# Patient Record
Sex: Female | Born: 1935 | Race: White | Hispanic: No | Smoking: Never smoker
Health system: Southern US, Community
[De-identification: ages and names within clinical notes are randomized; demographics above are authoritative.]

## PROBLEM LIST (undated history)

## (undated) DIAGNOSIS — K219 Gastro-esophageal reflux disease without esophagitis: Secondary | ICD-10-CM

## (undated) DIAGNOSIS — F329 Major depressive disorder, single episode, unspecified: Secondary | ICD-10-CM

## (undated) DIAGNOSIS — R112 Nausea with vomiting, unspecified: Secondary | ICD-10-CM

## (undated) DIAGNOSIS — F32A Depression, unspecified: Secondary | ICD-10-CM

## (undated) DIAGNOSIS — R945 Abnormal results of liver function studies: Secondary | ICD-10-CM

## (undated) DIAGNOSIS — H3530 Unspecified macular degeneration: Secondary | ICD-10-CM

## (undated) DIAGNOSIS — T7840XA Allergy, unspecified, initial encounter: Secondary | ICD-10-CM

## (undated) DIAGNOSIS — K317 Polyp of stomach and duodenum: Secondary | ICD-10-CM

## (undated) DIAGNOSIS — R000 Tachycardia, unspecified: Secondary | ICD-10-CM

## (undated) DIAGNOSIS — F0390 Unspecified dementia without behavioral disturbance: Secondary | ICD-10-CM

## (undated) DIAGNOSIS — H409 Unspecified glaucoma: Secondary | ICD-10-CM

## (undated) DIAGNOSIS — R7989 Other specified abnormal findings of blood chemistry: Secondary | ICD-10-CM

## (undated) DIAGNOSIS — M810 Age-related osteoporosis without current pathological fracture: Secondary | ICD-10-CM

## (undated) DIAGNOSIS — E559 Vitamin D deficiency, unspecified: Secondary | ICD-10-CM

## (undated) DIAGNOSIS — I10 Essential (primary) hypertension: Secondary | ICD-10-CM

## (undated) DIAGNOSIS — M1990 Unspecified osteoarthritis, unspecified site: Secondary | ICD-10-CM

## (undated) DIAGNOSIS — E039 Hypothyroidism, unspecified: Secondary | ICD-10-CM

## (undated) DIAGNOSIS — K589 Irritable bowel syndrome without diarrhea: Secondary | ICD-10-CM

## (undated) DIAGNOSIS — B029 Zoster without complications: Secondary | ICD-10-CM

## (undated) DIAGNOSIS — K224 Dyskinesia of esophagus: Secondary | ICD-10-CM

## (undated) DIAGNOSIS — K449 Diaphragmatic hernia without obstruction or gangrene: Secondary | ICD-10-CM

## (undated) DIAGNOSIS — J329 Chronic sinusitis, unspecified: Secondary | ICD-10-CM

## (undated) DIAGNOSIS — R51 Headache: Secondary | ICD-10-CM

## (undated) HISTORY — DX: Essential (primary) hypertension: I10

## (undated) HISTORY — DX: Abnormal results of liver function studies: R94.5

## (undated) HISTORY — PX: MULTIPLE TOOTH EXTRACTIONS: SHX2053

## (undated) HISTORY — DX: Nausea with vomiting, unspecified: R11.2

## (undated) HISTORY — DX: Unspecified macular degeneration: H35.30

## (undated) HISTORY — DX: Chronic sinusitis, unspecified: J32.9

## (undated) HISTORY — DX: Allergy, unspecified, initial encounter: T78.40XA

## (undated) HISTORY — DX: Tachycardia, unspecified: R00.0

## (undated) HISTORY — DX: Headache: R51

## (undated) HISTORY — DX: Zoster without complications: B02.9

## (undated) HISTORY — DX: Dyskinesia of esophagus: K22.4

## (undated) HISTORY — DX: Other specified abnormal findings of blood chemistry: R79.89

## (undated) HISTORY — DX: Gastro-esophageal reflux disease without esophagitis: K21.9

## (undated) HISTORY — DX: Polyp of stomach and duodenum: K31.7

## (undated) HISTORY — DX: Major depressive disorder, single episode, unspecified: F32.9

## (undated) HISTORY — DX: Irritable bowel syndrome, unspecified: K58.9

## (undated) HISTORY — DX: Diaphragmatic hernia without obstruction or gangrene: K44.9

## (undated) HISTORY — PX: UPPER GASTROINTESTINAL ENDOSCOPY: SHX188

## (undated) HISTORY — DX: Depression, unspecified: F32.A

## (undated) HISTORY — DX: Hypothyroidism, unspecified: E03.9

## (undated) HISTORY — DX: Age-related osteoporosis without current pathological fracture: M81.0

## (undated) HISTORY — DX: Vitamin D deficiency, unspecified: E55.9

## (undated) HISTORY — DX: Unspecified osteoarthritis, unspecified site: M19.90

---

## 1997-02-11 HISTORY — PX: TOTAL ABDOMINAL HYSTERECTOMY: SHX209

## 1999-07-13 ENCOUNTER — Other Ambulatory Visit: Admission: RE | Admit: 1999-07-13 | Discharge: 1999-07-13 | Payer: Self-pay | Admitting: Family Medicine

## 2003-12-22 ENCOUNTER — Encounter: Admission: RE | Admit: 2003-12-22 | Discharge: 2003-12-22 | Payer: Self-pay | Admitting: Internal Medicine

## 2004-07-24 ENCOUNTER — Ambulatory Visit (HOSPITAL_COMMUNITY): Admission: RE | Admit: 2004-07-24 | Discharge: 2004-07-24 | Payer: Self-pay | Admitting: Allergy

## 2006-02-11 HISTORY — PX: COLONOSCOPY: SHX174

## 2006-02-28 ENCOUNTER — Ambulatory Visit: Payer: Self-pay | Admitting: Internal Medicine

## 2006-02-28 LAB — CONVERTED CEMR LAB: Sed Rate: 13 mm/hr (ref 0–25)

## 2006-03-04 ENCOUNTER — Ambulatory Visit (HOSPITAL_COMMUNITY): Admission: RE | Admit: 2006-03-04 | Discharge: 2006-03-04 | Payer: Self-pay | Admitting: Internal Medicine

## 2006-03-04 ENCOUNTER — Encounter: Payer: Self-pay | Admitting: Internal Medicine

## 2006-03-04 ENCOUNTER — Encounter (INDEPENDENT_AMBULATORY_CARE_PROVIDER_SITE_OTHER): Payer: Self-pay | Admitting: Specialist

## 2006-03-04 DIAGNOSIS — D131 Benign neoplasm of stomach: Secondary | ICD-10-CM

## 2006-03-10 ENCOUNTER — Ambulatory Visit: Payer: Self-pay | Admitting: Internal Medicine

## 2006-09-24 ENCOUNTER — Ambulatory Visit: Payer: Self-pay | Admitting: Internal Medicine

## 2006-09-24 LAB — CONVERTED CEMR LAB
Basophils Absolute: 0 10*3/uL (ref 0.0–0.1)
Eosinophils Absolute: 0.3 10*3/uL (ref 0.0–0.6)
Eosinophils Relative: 4.1 % (ref 0.0–5.0)
HCT: 42.4 % (ref 36.0–46.0)
MCV: 91.1 fL (ref 78.0–100.0)
Platelets: 267 10*3/uL (ref 150–400)
RBC: 4.65 M/uL (ref 3.87–5.11)
RDW: 11.5 % (ref 11.5–14.6)
WBC: 6.7 10*3/uL (ref 4.5–10.5)

## 2006-09-26 ENCOUNTER — Ambulatory Visit (HOSPITAL_COMMUNITY): Admission: RE | Admit: 2006-09-26 | Discharge: 2006-09-26 | Payer: Self-pay | Admitting: Internal Medicine

## 2006-09-26 ENCOUNTER — Encounter: Payer: Self-pay | Admitting: Internal Medicine

## 2006-09-26 DIAGNOSIS — K222 Esophageal obstruction: Secondary | ICD-10-CM | POA: Insufficient documentation

## 2006-09-26 DIAGNOSIS — K449 Diaphragmatic hernia without obstruction or gangrene: Secondary | ICD-10-CM

## 2006-09-26 DIAGNOSIS — K224 Dyskinesia of esophagus: Secondary | ICD-10-CM

## 2006-11-11 ENCOUNTER — Ambulatory Visit: Payer: Self-pay | Admitting: Internal Medicine

## 2007-04-21 DIAGNOSIS — E785 Hyperlipidemia, unspecified: Secondary | ICD-10-CM | POA: Insufficient documentation

## 2007-04-21 DIAGNOSIS — K589 Irritable bowel syndrome without diarrhea: Secondary | ICD-10-CM | POA: Insufficient documentation

## 2007-04-21 DIAGNOSIS — E079 Disorder of thyroid, unspecified: Secondary | ICD-10-CM | POA: Insufficient documentation

## 2007-04-21 DIAGNOSIS — T7840XA Allergy, unspecified, initial encounter: Secondary | ICD-10-CM | POA: Insufficient documentation

## 2007-04-21 DIAGNOSIS — J209 Acute bronchitis, unspecified: Secondary | ICD-10-CM | POA: Insufficient documentation

## 2007-04-21 DIAGNOSIS — K219 Gastro-esophageal reflux disease without esophagitis: Secondary | ICD-10-CM

## 2007-06-28 ENCOUNTER — Emergency Department (HOSPITAL_COMMUNITY): Admission: EM | Admit: 2007-06-28 | Discharge: 2007-06-28 | Payer: Self-pay | Admitting: Emergency Medicine

## 2007-07-13 ENCOUNTER — Emergency Department (HOSPITAL_COMMUNITY): Admission: EM | Admit: 2007-07-13 | Discharge: 2007-07-13 | Payer: Self-pay | Admitting: Emergency Medicine

## 2007-08-10 ENCOUNTER — Encounter: Admission: RE | Admit: 2007-08-10 | Discharge: 2007-11-09 | Payer: Self-pay | Admitting: Orthopedic Surgery

## 2008-03-01 ENCOUNTER — Ambulatory Visit: Payer: Self-pay | Admitting: Internal Medicine

## 2008-03-08 ENCOUNTER — Ambulatory Visit: Payer: Self-pay | Admitting: Internal Medicine

## 2010-03-03 ENCOUNTER — Encounter: Payer: Self-pay | Admitting: Internal Medicine

## 2010-03-23 ENCOUNTER — Ambulatory Visit (INDEPENDENT_AMBULATORY_CARE_PROVIDER_SITE_OTHER): Payer: Medicare Other | Admitting: Cardiology

## 2010-03-23 DIAGNOSIS — I498 Other specified cardiac arrhythmias: Secondary | ICD-10-CM

## 2010-04-24 ENCOUNTER — Ambulatory Visit (INDEPENDENT_AMBULATORY_CARE_PROVIDER_SITE_OTHER): Payer: Medicare Other | Admitting: Cardiology

## 2010-04-24 DIAGNOSIS — I4949 Other premature depolarization: Secondary | ICD-10-CM

## 2010-04-24 DIAGNOSIS — I10 Essential (primary) hypertension: Secondary | ICD-10-CM

## 2010-04-24 DIAGNOSIS — I495 Sick sinus syndrome: Secondary | ICD-10-CM

## 2010-06-26 NOTE — Assessment & Plan Note (Signed)
[facility]                         GASTROENTEROLOGY OFFICE NOTE   Megan Foster, Megan Foster                          MRN:          [id number]  DATE:09/24/2006                            DOB:          26-Jul-1927    Megan Foster is a 77 year old white female who is here today because of  followed continued weight loss.  We saw her in January of this year for  evaluation of Hemoccult positive stool.  Her upper endoscopy and  colonoscopy did not account for Hemoccult positive stool.  At that point  her hemoglobin was normal.  Were symptoms were suggestive of irritable  bowel syndrome and she was put on Levbid 0.375 mg twice a day.  She took  the prescription for a month and then discontinued.  She also has a  history of gastroesophageal reflux for which she was initially on  Prevacid and also Nexium.  Her weight in January was 167 pounds,  currently 153 pounds.  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  She  does not feel well, she has abdominal pain with each meal, it occurs to  the epigastrium.  There is occasionally back pain but most of her  discomfort is periumbilical.  It wakes her up at night as well.  The  pain occurs shortly postprandially but also is not related to meals.  She denies diarrhea, her stools are after each meal usually once 3-4  times a day.  She denies any fever, night sweats or rectal bleeding.  CT  scan of the abdomen in January of this year and in August of this year  showed reactive lymph node in the mesentery, normal appearing pancreas,  as well as gallbladder.  There was some diverticulosis in the pelvis and  marked small hiatal hernia.  There was remote granulomatous disease of  the liver and the spleen.  Her lab tests show normal serum albumin of  3.5 and 3.6 respectively in the last several months, her liver function  tests have been normal and her TSH apparently  was initially high but  last two measurements, measured in March and August of this year are  2.47 on Synthroid 50 mcg daily.  Patient's sister died of mesenteric  artery thrombosis and patient is worried about possibility of intestinal  angina.   MEDICATIONS:  1. Singulair 10 mg daily.  2. Prevacid 30 mg b.i.d.  3. Levothyroxine 50 mcg daily.  4. Vitamin D 50,000 units daily.  5. Advair 250/50 daily.  6. Multiple vitamins.  7. Aspirin 81 mg p.o. daily.  8. Xyzal 5 mg daily.  9. Zolpidem 10 mg half tablet nightly.  10.Calcium supplements.   PHYSICAL EXAMINATION:  Blood pressure 130/82, pulse 84, weight 153  pounds.  Patient was rather nervous, somewhat jittery but alert and  oriented and very cooperative.  There was no resting tremor.  NECK:  Showed no evidence of goiter.  LUNGS:  Clear to auscultation.  Sclerae is nonicteric.  COR:  With rapid S1 and S2, no  murmur.  ABDOMEN:  Soft with normoactive bowel sounds, diffuse mild tenderness  more so in the epigastrium, in the midline no pulsations, no bruits.  Liver edge at costal margin.  RECTAL:  Moderate amount of solid Hemoccult negative stool.  EXTREMITIES:  No edema.   IMPRESSION:  A 77 year old white female with 14 pound weight loss in  last 8 months which is not clearly explained.  Patient has retired and  lifestyle has changed.  She is eating less.  At the same time she is  having abdominal pain but her upper endoscopy within the last 7 months  was essentially unremarkable.  She already is on proton pump inhibitor.  Her pancreas appears normal on the CT scan and she denies diarrhea.  One  needs to consider possibility of intestinal angina or that the pain  seems to also occur at night.  Biliary problem would be another  possibility although CT scan shows normal gallbladder.  Small bowel  tumor such as carcinoid would be a possibility, irritable bowel syndrome  certainly is a consideration.  She has been under a lot of  stress  because her illness of her sister.  The thyroid hormone which was added  several months ago might have caused also some problems with tachycardia  and possible weight loss, although her TSH most recently is normal.   PLAN:  1. Upper GI with small bowel follow through to assess the transit time      and rule out any space occupying lesions of small bowel.  2. Twenty-four hour urine collection for HIAAs to rule out carcinoid.  3. Patient was to switch from Prevacid to Nexium 40 mg daily.  4. Bentyl 10 mg t.i.d. before each meal as an antispasmodic.  5. I have asked patient to hold her thyroid for about 4 weeks to see      whether some of her jitteriness and nervousness goes away.   She will return in 4-6 weeks and at that time we will re-evaluate her  condition.  It is somewhat comforting to know that her serum albumin has  been normal at 3.5 and 3.6 despite her weight loss.     Megan Morton. Juanda Chance, MD  Electronically Signed    DMB/MedQ  DD: 09/24/2006  DT: 09/25/2006  Job #: [id number]   cc:   Kari Baars, M.D.

## 2010-06-26 NOTE — Assessment & Plan Note (Signed)
[facility]                         GASTROENTEROLOGY OFFICE NOTE   TSION, INGHRAM                          MRN:          [id number]  DATE:11/11/2006                            DOB:          March 27, 1927    [name] is a very nice 77 year old white female with what we think is  an irritable bowel syndrome. We saw her on September 24, 2006 with  complaints of epigastric pain, cramping, urgent bowel movements - all  starting about the time when her sister was quite sick. She also lost 14  pounds and was not eating very much. We have done a complete workup  including upper and lower endoscopy in January of 2008 and a small-bowel  follow through in August 2008. She had a 24-hour urine collection for  HIAs which was all negative. We discontinued her Synthroid and put her  on antispasmodic, Bentyl, 10 mg 3 times a day as well as Nexium 40 mg a  day. She is feeling more than 50% better, although she is still having  some gas and urgency. She is satisfied with her progress. I feel that  she is most likely dealing with an irritable bowel syndrome, possibly  some effects of the thyroid.   PHYSICAL EXAMINATION:  Blood pressure 116/80, pulse 60, and weight 153  pounds which is the same as on last exam. She appeared healthy.  ABDOMINAL EXAM:  Shows normal active bowel sounds, no tenderness, no  distention, no tympany, liver edge at costal margin.   IMPRESSION:  A 77 year old white female with likely irritable bowel  syndrome aggravated by recent stress, possibly by thyroid medication or  bacterial overgrowth.   PLAN:  1. Continue Bentyl. In fact, we may increase it to 20 mg 3 times a      day.  2. Samples of probiotic, Align, 1 a day. I gave her samples for 2      weeks. She may obtain it from an online prescription.  3. Booklet on gas and flatulence.  4. She will come and check with [place] in about three months to see if her      symptoms continue.     Hedwig Morton.  Juanda Chance, MD  Electronically Signed    DMB/MedQ  DD: 11/11/2006  DT: 11/12/2006  Job #: [id number]   cc:   Kari Baars, M.D.

## 2010-06-29 NOTE — Assessment & Plan Note (Signed)
[facility]                         GASTROENTEROLOGY OFFICE NOTE   SATYA, BOHALL                          MRN:          [id number]  DATE:02/28/2006                            DOB:          1927-07-26    Megan Foster is a very nice 77 year old white female.  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  She has had digestive problems since June of  last year.  She had a screening colonoscopy/upper endoscopy in 1995 in  [place], which apparently were normal.  I have done upper endoscopy and  colonoscopy in 2003 after an episode of hematochezia.  The exam showed  essentially normal exam with no polyps or diverticulosis.  She had a  normal upper endoscopy.  Since June of last year, she has complained of  abdominal bloating, swelling, irregular bowel movements, some diarrhea  and constipation, nausea and vomiting.  Symptoms have progressed.  She  was treated temporarily for gastroenteritis with Flagyl.  The last  several days, she had nocturnal vomiting and upper abdominal discomfort.  No rectal bleeding, no night sweats, no significant weight loss, except  for possibly 2 or 3 pounds.  She was given 2 courses of antibiotics in  November without much resolution of her symptoms.   MEDICATIONS:  1. Singulair 10 mg daily.  2. Zyrtec 10 mg p.o. daily.  3. Prevacid 30 mg p.o. b.i.d.  4. Levothyroxine 50 mcg p.o. daily.  5. Vitamin D.  6. Simvastatin 40 mg p.o. daily.  7. Advair 250/50 daily.  8. Fluticasone 50 mcg daily.  9. Multivitamin.  10.Aspirin.   PAST HISTORY:  Significant for asthmatic bronchitis, hyperlipidemia,  thyroid problems, allergies.   OPERATIONS:  Hysterectomy.   She has a widespread family history of cancer.  Brother died at 20 of  esophageal and gastric CA.  Sister had a breast CA, and another sister  died of mesenteric ischemia.  Multiple aunts had cancer.  Father had  prostate cancer.  Her mother, father, and sister had heart  disease.   SOCIAL HISTORY:  She is married with 1 daughter.  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  She does not smoke and  drinks alcohol only occasionally.   REVIEW OF SYSTEMS:  Positive for eyeglasses, allergies, frequent cough,  arthritic complaints, sleeping problems, severe fatigue and muscle  pains.   PHYSICAL EXAMINATION:  Blood pressure 120/60, pulse 85, weight 167  pounds.  She was alert, oriented, and in no distress.  Sclerae were nonicteric.  Oral cavity was normal.  Neck was supple without adenopathy.  LUNGS:  Clear to auscultation.  COR:  Normal S1, normal S2.  ABDOMEN:  Soft with normoactive bowel sounds.  Mild tenderness in the  deep, deep left lower quadrant on pressure, but no rebound and no  fullness in this area.  There was also tenderness in right lower  quadrant.  Liver edge at costal margin.  No tenderness over the  gallbladder area.  RECTAL EXAM:  Normal rectal tone.  There was essentially no stool.  Mucous was  heme positive.  EXTREMITIES:  No edema.   CT scan of the abdomen and pelvis done yesterday shows a small renal  lesion in the left side, difficult to identify and describe because of  the small size.  There was also a soft tissue density between inferior  vena cava and portal vein, difficult to define.  There was some mild  prominence of mesenteric lymph nodes, but her pancreas, gallbladder  otherwise looked normal.  Also, pelvic structures were normal except for  surgically absent ovaries and uterus.   LABORATORY DATA:  All normal.   IMPRESSION:  68. A 77 year old female with bloating, abdominal pain, nausea and      vomiting of unknown etiology.  Essentially normal CT scan of the      abdomen.  She is hemoccult positive on my physical exam, which      could be related to diarrhea today.  She is not anemic.  Her      previous gastrointestinal workup in 2004 was essentially negative.      Patient wants further evaluation  to rule out occult malignancy.  We      will obtain CEA level and sed rate today.  Also, schedule for upper      endoscopy and colonoscopy.  2. Switch her from Prevacid to Nexium 40 mg daily.  3. Use Levbid 0.375 mg b.i.d. as an antispasmodic on a trial basis.      Patient says that she has been under a great deal of stress, and      possibly her symptoms could be functional, but she is quite      concerned about the possibility of cancer because of the strong      family history of it, so we will complete our workup first and then      decide if she needs any other disposition.     Hedwig Morton. Juanda Chance, MD  Electronically Signed    DMB/MedQ  DD: 02/28/2006  DT: 02/28/2006  Job #: [id number]   cc:   Kari Baars, M.D.

## 2010-11-09 ENCOUNTER — Other Ambulatory Visit: Payer: Self-pay | Admitting: Cardiology

## 2010-11-09 MED ORDER — METOPROLOL SUCCINATE ER 50 MG PO TB24: 50.0000 mg | ORAL_TABLET | Freq: Every day | ORAL | Status: DC

## 2010-11-09 NOTE — Telephone Encounter (Signed)
toprol 50 mg, uses [employer]

## 2010-11-09 NOTE — Telephone Encounter (Signed)
escribe medication per fax request  

## 2010-12-18 ENCOUNTER — Ambulatory Visit (INDEPENDENT_AMBULATORY_CARE_PROVIDER_SITE_OTHER): Payer: Medicare Other | Admitting: Cardiology

## 2010-12-18 ENCOUNTER — Encounter: Payer: Self-pay | Admitting: Cardiology

## 2010-12-18 VITALS — BP 144/82 | HR 83 | Ht 66.0 in | Wt 151.0 lb

## 2010-12-18 DIAGNOSIS — I10 Essential (primary) hypertension: Secondary | ICD-10-CM | POA: Insufficient documentation

## 2010-12-18 DIAGNOSIS — R002 Palpitations: Secondary | ICD-10-CM | POA: Insufficient documentation

## 2010-12-18 MED ORDER — FLUTICASONE PROPIONATE 50 MCG/ACT NA SUSP: 2.0000 | Freq: Every day | NASAL | Status: AC

## 2010-12-18 NOTE — Assessment & Plan Note (Signed)
Symptomatically improved on Toprol therapy. Event monitor showed some sinus tachycardia and rare PVCs. We will continue with her beta blocker therapy. She needs to avoid caffeine.

## 2010-12-18 NOTE — Patient Instructions (Signed)
Stop taking decongestants. Take Flonase instead or just an antihistamine.  Avoid salt.  Limit your caffeine use.  I will see you again 1 year.

## 2010-12-18 NOTE — Assessment & Plan Note (Signed)
Blood pressure is elevated today. I recommended that she stop taking decongestants. We will substitute Flonase 2 sprays daily. She is to limit her sodium intake. We will continue on her current Toprol dose.

## 2010-12-18 NOTE — Progress Notes (Signed)
Megan Foster Date of Birth: February 25, 1927 Medical Record #[id number]  History of Present Illness: Megan Foster is seen today for followup of palpitations. She has done very well over the past 6 months. She denies any palpitations or tachycardia. She has been taking a decongestant daily for her sinuses. She denies any shortness of breath or chest pain.  Current Outpatient Prescriptions on File Prior to Visit  Medication Sig Dispense Refill  . Acetaminophen (TYLENOL PO) Take by mouth as needed.        Marland Kitchen aspirin 81 MG tablet Take 81 mg by mouth daily.        Marland Kitchen CALCIUM PO Take by mouth 2 (two) times daily.        . Cholecalciferol (VITAMIN D PO) Take by mouth once a week.       . levothyroxine (SYNTHROID, LEVOTHROID) 50 MCG tablet Take 50 mcg by mouth daily.        . metoprolol (TOPROL XL) 50 MG 24 hr tablet Take 1 tablet (50 mg total) by mouth daily.  30 tablet  5  . Montelukast Sodium (SINGULAIR PO) Take by mouth daily.        . Multiple Vitamin (MULTI-VITAMIN PO) Take by mouth daily.          No Known Allergies  Past Medical History  Diagnosis Date  . Asthma   . Hypertension   . GERD (gastroesophageal reflux disease)   . Hypothyroid   . Irritable bowel syndrome   . Tachycardia     Past Surgical History  Procedure Date  . Total abdominal hysterectomy 1999  . Multiple tooth extractions     History  Smoking status  . Never Smoker   Smokeless tobacco  . Not on file    History  Alcohol Use  . Yes    Family History  Problem Relation Age of Onset  . Hypertension Mother   . Heart attack Father 46  . Heart failure Sister   . Hypertension Brother   . Hypertension Brother     Review of Systems: The review of systems is positive for chronic sinus congestion.  Flonase has helped in the past. All other systems were reviewed and are negative.  Physical Exam: BP 144/82  Pulse 83  Ht 5\' 6"  (1.676 m)  Wt 151 lb (68.493 kg)  BMI 24.37 kg/m2 She is an elderly white female  in no acute distress. Repeat blood pressure was 154/92.The patient is alert and oriented x 3.  The mood and affect are normal.  The skin is warm and dry.  Color is normal.  The HEENT exam reveals that the sclera are nonicteric.  The mucous membranes are moist.  The carotids are 2+ without bruits.  There is no thyromegaly.  There is no JVD.  The lungs are clear.  The chest wall is non tender.  The heart exam reveals a regular rate with a normal S1 and S2.  There are no murmurs, gallops, or rubs.  The PMI is not displaced.   Abdominal exam reveals good bowel sounds.  There is no guarding or rebound.  There is no hepatosplenomegaly or tenderness.  There are no masses.  Exam of the legs reveal no clubbing, cyanosis, or edema.  The legs are without rashes.  The distal pulses are intact.  Cranial nerves II - XII are intact.  Motor and sensory functions are intact.  The gait is normal.  LABORATORY DATA: ECG demonstrates normal sinus rhythm with nonspecific ST-T wave  abnormality.  Assessment / Plan:

## 2011-04-24 DIAGNOSIS — Z1231 Encounter for screening mammogram for malignant neoplasm of breast: Secondary | ICD-10-CM | POA: Diagnosis not present

## 2011-05-03 ENCOUNTER — Other Ambulatory Visit: Payer: Self-pay | Admitting: *Deleted

## 2011-05-03 MED ORDER — METOPROLOL SUCCINATE ER 50 MG PO TB24: 50.0000 mg | ORAL_TABLET | Freq: Every day | ORAL | Status: DC

## 2011-06-06 DIAGNOSIS — E039 Hypothyroidism, unspecified: Secondary | ICD-10-CM | POA: Diagnosis not present

## 2011-06-06 DIAGNOSIS — R05 Cough: Secondary | ICD-10-CM | POA: Diagnosis not present

## 2011-06-06 DIAGNOSIS — R7301 Impaired fasting glucose: Secondary | ICD-10-CM | POA: Diagnosis not present

## 2011-06-06 DIAGNOSIS — J45909 Unspecified asthma, uncomplicated: Secondary | ICD-10-CM | POA: Diagnosis not present

## 2011-06-06 DIAGNOSIS — F329 Major depressive disorder, single episode, unspecified: Secondary | ICD-10-CM | POA: Diagnosis not present

## 2011-10-08 DIAGNOSIS — R7301 Impaired fasting glucose: Secondary | ICD-10-CM | POA: Diagnosis not present

## 2011-10-08 DIAGNOSIS — E559 Vitamin D deficiency, unspecified: Secondary | ICD-10-CM | POA: Diagnosis not present

## 2011-10-08 DIAGNOSIS — M810 Age-related osteoporosis without current pathological fracture: Secondary | ICD-10-CM | POA: Diagnosis not present

## 2011-10-08 DIAGNOSIS — E781 Pure hyperglyceridemia: Secondary | ICD-10-CM | POA: Diagnosis not present

## 2011-10-08 DIAGNOSIS — R82998 Other abnormal findings in urine: Secondary | ICD-10-CM | POA: Diagnosis not present

## 2011-10-08 DIAGNOSIS — E039 Hypothyroidism, unspecified: Secondary | ICD-10-CM | POA: Diagnosis not present

## 2011-10-08 DIAGNOSIS — I10 Essential (primary) hypertension: Secondary | ICD-10-CM | POA: Diagnosis not present

## 2011-10-16 DIAGNOSIS — I10 Essential (primary) hypertension: Secondary | ICD-10-CM | POA: Diagnosis not present

## 2011-10-16 DIAGNOSIS — Z23 Encounter for immunization: Secondary | ICD-10-CM | POA: Diagnosis not present

## 2011-10-16 DIAGNOSIS — R7301 Impaired fasting glucose: Secondary | ICD-10-CM | POA: Diagnosis not present

## 2011-10-16 DIAGNOSIS — E781 Pure hyperglyceridemia: Secondary | ICD-10-CM | POA: Diagnosis not present

## 2011-10-16 DIAGNOSIS — Z0000 Encounter for general adult medical examination without abnormal findings: Secondary | ICD-10-CM | POA: Diagnosis not present

## 2011-11-06 ENCOUNTER — Other Ambulatory Visit: Payer: Self-pay

## 2011-11-06 MED ORDER — METOPROLOL SUCCINATE ER 50 MG PO TB24: 50.0000 mg | ORAL_TABLET | Freq: Every day | ORAL | Status: DC

## 2011-11-15 DIAGNOSIS — H1045 Other chronic allergic conjunctivitis: Secondary | ICD-10-CM | POA: Diagnosis not present

## 2011-11-15 DIAGNOSIS — J45909 Unspecified asthma, uncomplicated: Secondary | ICD-10-CM | POA: Diagnosis not present

## 2011-11-15 DIAGNOSIS — J309 Allergic rhinitis, unspecified: Secondary | ICD-10-CM | POA: Diagnosis not present

## 2012-01-28 DIAGNOSIS — M949 Disorder of cartilage, unspecified: Secondary | ICD-10-CM | POA: Diagnosis not present

## 2012-01-29 ENCOUNTER — Ambulatory Visit (INDEPENDENT_AMBULATORY_CARE_PROVIDER_SITE_OTHER): Payer: Medicare Other | Admitting: Cardiology

## 2012-01-29 ENCOUNTER — Encounter: Payer: Self-pay | Admitting: Cardiology

## 2012-01-29 ENCOUNTER — Other Ambulatory Visit: Payer: Self-pay | Admitting: Cardiology

## 2012-01-29 VITALS — BP 129/79 | HR 82 | Wt 154.0 lb

## 2012-01-29 DIAGNOSIS — R002 Palpitations: Secondary | ICD-10-CM

## 2012-01-29 MED ORDER — METOPROLOL SUCCINATE ER 50 MG PO TB24: 50.0000 mg | ORAL_TABLET | Freq: Every day | ORAL | Status: DC

## 2012-01-29 NOTE — Progress Notes (Signed)
   Brayton Caves Date of Birth: 18-May-1927 Medical Record #[id number]  History of Present Illness: Mrs. Megan Foster is seen today for followup of palpitations. She has done very well over the past year. She denies any palpitations or tachycardia. infectious had no recurrent palpitations since she was placed on metoprolol several years ago. She really has had no significant medical problems this year except for mild sinusitis.   Current Outpatient Prescriptions on File Prior to Visit  Medication Sig Dispense Refill  . Acetaminophen (TYLENOL PO) Take by mouth as needed.        . Albuterol Sulfate (PROAIR HFA IN) Inhale into the lungs as needed.      Marland Kitchen aspirin 81 MG tablet Take 81 mg by mouth daily.        . budesonide-formoterol (SYMBICORT) 160-4.5 MCG/ACT inhaler Inhale 2 puffs into the lungs 2 (two) times daily.      Marland Kitchen CALCIUM PO Take by mouth 2 (two) times daily.        . Cholecalciferol (VITAMIN D PO) Take by mouth once a week.       . fluticasone (FLONASE) 50 MCG/ACT nasal spray Place 2 sprays into the nose daily.  1 g  0  . levothyroxine (SYNTHROID, LEVOTHROID) 50 MCG tablet Take 50 mcg by mouth daily.        . metoprolol succinate (TOPROL XL) 50 MG 24 hr tablet Take 1 tablet (50 mg total) by mouth daily.  30 tablet  2  . Montelukast Sodium (SINGULAIR PO) Take by mouth daily.        . Multiple Vitamin (MULTI-VITAMIN PO) Take by mouth daily.          No Known Allergies  Past Medical History  Diagnosis Date  . Asthma   . Hypertension   . GERD (gastroesophageal reflux disease)   . Hypothyroid   . Irritable bowel syndrome   . Tachycardia   . Sinusitis     Past Surgical History  Procedure Date  . Total abdominal hysterectomy 1999  . Multiple tooth extractions     History  Smoking status  . Never Smoker   Smokeless tobacco  . Not on file    History  Alcohol Use  . Yes    Family History  Problem Relation Age of Onset  . Hypertension Mother   . Heart attack Father 53  .  Heart failure Sister   . Hypertension Brother   . Hypertension Brother     Review of Systems:  as noted in history of present illness  All other systems were reviewed and are negative.  Physical Exam: BP 129/79  Pulse 82  Wt 154 lb (69.854 kg) She is an elderly white female in no acute distress.   The HEENT exam is normal. There is no JVD or bruits. Lungs are clear. Her neck exam is without gallop or murmur. She has no edema. Pedal pulses are palpable.   LABORATORY DATA: ECG demonstrates normal sinus rhythm with  sinus arrhythmia. Otherwise normal.   Assessment / Plan: 1. Palpitations. Previous event monitor showed rare PVCs. This is well controlled on metoprolol. I recommend followup on a when necessary basis.

## 2012-01-29 NOTE — Patient Instructions (Signed)
Continue your current medication  I will see you as needed.

## 2012-01-30 DIAGNOSIS — H40019 Open angle with borderline findings, low risk, unspecified eye: Secondary | ICD-10-CM | POA: Diagnosis not present

## 2012-01-30 DIAGNOSIS — H04129 Dry eye syndrome of unspecified lacrimal gland: Secondary | ICD-10-CM | POA: Diagnosis not present

## 2012-01-30 DIAGNOSIS — H2510 Age-related nuclear cataract, unspecified eye: Secondary | ICD-10-CM | POA: Diagnosis not present

## 2012-01-30 DIAGNOSIS — H40029 Open angle with borderline findings, high risk, unspecified eye: Secondary | ICD-10-CM | POA: Diagnosis not present

## 2012-02-12 HISTORY — PX: ESOPHAGOGASTRODUODENOSCOPY: SHX1529

## 2012-03-04 DIAGNOSIS — R110 Nausea: Secondary | ICD-10-CM | POA: Diagnosis not present

## 2012-03-04 DIAGNOSIS — K219 Gastro-esophageal reflux disease without esophagitis: Secondary | ICD-10-CM | POA: Diagnosis not present

## 2012-03-04 DIAGNOSIS — E039 Hypothyroidism, unspecified: Secondary | ICD-10-CM | POA: Diagnosis not present

## 2012-03-23 ENCOUNTER — Telehealth: Payer: Self-pay | Admitting: Internal Medicine

## 2012-03-23 NOTE — Telephone Encounter (Signed)
Patient calling to report loss of appetite, nausea, upper abdominal pain in center of abdomen and difficulty swallowing. States she has had problems since Thanksgiving and has been seeing her PCP. She has tried Algin and Mylanta. Scheduled patient on 03/24/10 at 3:30 PM.

## 2012-03-23 NOTE — Telephone Encounter (Signed)
Patient would like to make an appointment with Dr. Juanda Chance.  I offered to make her an appointment but she requested to speak to the nurse first before she made an appointment.

## 2012-03-24 ENCOUNTER — Encounter: Payer: Self-pay | Admitting: *Deleted

## 2012-03-24 ENCOUNTER — Ambulatory Visit (INDEPENDENT_AMBULATORY_CARE_PROVIDER_SITE_OTHER): Payer: Medicare Other | Admitting: Internal Medicine

## 2012-03-24 VITALS — BP 120/62 | HR 64 | Ht 66.0 in | Wt 146.4 lb

## 2012-03-24 DIAGNOSIS — R634 Abnormal weight loss: Secondary | ICD-10-CM

## 2012-03-24 DIAGNOSIS — R109 Unspecified abdominal pain: Secondary | ICD-10-CM | POA: Diagnosis not present

## 2012-03-24 MED ORDER — DICYCLOMINE HCL 10 MG PO CAPS: 10.0000 mg | ORAL_CAPSULE | Freq: Three times a day (TID) | ORAL | Status: DC

## 2012-03-24 NOTE — Patient Instructions (Addendum)
You have been scheduled for a CT angiogram scan of the abdomen and pelvis at [facility] ([address]---this is in the same building as [facility]).   You are scheduled on 04/02/12 at 9:00 am. You should arrive 15 minutes prior to your appointment time for registration. Please follow the written instructions below on the day of your exam:  WARNING: IF YOU ARE ALLERGIC TO IODINE/X-RAY DYE, PLEASE NOTIFY RADIOLOGY IMMEDIATELY AT [phone]! YOU WILL BE GIVEN A 13 HOUR PREMEDICATION PREP.  1) Do not eat or drink anything after 7:00 am (2 hours prior to your test)  You may take any medications as prescribed with a small amount of water except for the following: Metformin, Glucophage, Glucovance, Avandamet, Riomet, Fortamet, Actoplus Met, Janumet, Glumetza or Metaglip. The above medications must be held the day of the exam AND 48 hours after the exam.  If you have any questions regarding your exam or if you need to reschedule, you may call the CT department at [phone] between the hours of 8:00 am and 5:00 pm, Monday-Friday.  ________________________________________________________________________  Megan Foster have been scheduled for an endoscopy with propofol. Please follow written instructions given to you at your visit today. If you use inhalers (even only as needed) or a CPAP machine, please bring them with you on the day of your procedure.  Your physician has requested that you go to the basement for the following lab work before leaving today: Celiac 10, Sed Rate  We have sent the following medications to your pharmacy for you to pick up at your convenience: Bentyl  CC: Dr Buren Kos

## 2012-03-24 NOTE — Progress Notes (Signed)
Megan Foster 1927/07/02 MRN [id number]   History of Present Illness:  This is an 79 year old white female with documented weight loss and abdominal pain mostly postprandial but also independent o fmeals. She is complaining of dysphagia to solids as well as liquids. We have seen her in the past for the same reason. In 2008, she was evaluated for heme positive stool and weight loss. Her weight dropped from 167 pounds to 153 pounds. A CT scan of the abdomen showed reactive mesenteric lymph nodes and a normal appearing pancreas and gallbladder. An upper endoscopy did not show any stricture but she was dilated with savary 16 mm dilator. Her dysphagia improved. A colonoscopy at that time was essentially normal. A prior colonoscopy was completed in 2003. In August 2008, her UGI and small bowel follow-through was negative except for esophageal dysmotility and tertiary contractions. She was having severe diarrhea but the workup was negative including a normal 24-hour urine for HIAA's. She  improved on Bentyl 10 mg 3 times a day. It was my opinion at that time that her weight loss was functional. In November 2012, her weight was 151 pounds and in December 2013 it was 154 pounds. Today she is 146 pounds. The daughter, who lives with her, says that patient hardly eats anything and that she has decreased appetite and early satiety as well as decreased energy. Her husband died 2 years ago and depression may be part of the problem. Patient's sister died of mesenteric thrombosis and the daughter is inquiring about the possibility of  Mesenteric ischemia.   Past Medical History  Diagnosis Date  . Asthma   . Hypertension   . GERD (gastroesophageal reflux disease)   . Hypothyroid   . Irritable bowel syndrome   . Tachycardia   . Sinusitis   . Hyperplastic polyps of stomach   . Hiatal hernia   . Esophageal dysmotility   . Headache   . Osteoporosis   . Elevated LFTs   . Shingles   . Vitamin D deficiency   .  Depression   . Arthritis   . Hypertension   . GERD (gastroesophageal reflux disease)    Past Surgical History  Procedure Laterality Date  . Total abdominal hysterectomy  1999  . Multiple tooth extractions      reports that she has never smoked. She has never used smokeless tobacco. She reports that she does not drink alcohol or use illicit drugs. family history includes Breast cancer in an unspecified family member; Colon cancer in her sister; Colon polyps in her sister; Heart attack (age of onset: 73) in her father; Heart failure in her sister; Hypertension in her brothers and mother; Ovarian cancer in an unspecified family member; Prostate cancer in her maternal uncle; and Stomach cancer in her sister. No Known Allergies      Review of Systems: Dysphagia to solids and liquids. Denies chest pain. Positive for weight loss negative for rectal bleeding  The remainder of the 10 point ROS is negative except as outlined in H&P   Physical Exam: General appearance  Well developed, in no distress. Eyes- non icteric. HEENT nontraumatic, normocephalic. Mouth no lesions, tongue papillated, no cheilosis. Neck supple without adenopathy, thyroid not enlarged, no carotid bruits, no JVD. Lungs Clear to auscultation bilaterally. Cor normal S1, normal S2, regular rhythm, no murmur,  quiet precordium. Abdomen: Soft with normoactive bowel sounds. No bruit. Mild tenderness in the epigastrium. Normal lower abdomen and left and right lower quadrants. No distention. Rectal: Soft Hemoccult  negative stool. Extremities no pedal edema. Skin no lesions. Neurological alert and oriented x 3. Psychological normal mood and affect.  Assessment and Plan:  Problem #1 78 year old white female with at least an 8 pound weight loss, decreased appetite and abdominal pain which is sometimes associated with meals. Mesenteric ischemia would be a possibility although her pain does not always come with meals. Her dysphagia  is likely due to esophageal dysmotility as demonstrated on her small bowel follow-through in the past. We will obtain a CT angiogram. Peptic ulcer disease and gastritis may be another explanation for epigastric discomfort. We will proceed with an upper endoscopy and biopsies. She will continue on the Bentyl 10 mg twice a day and we will also obtain a sprue profile, sedimentation rate today. She had a mild elevation of alkaline phosphatase on her recent labs.   03/24/2012 Megan Foster

## 2012-03-27 ENCOUNTER — Encounter: Payer: Medicare Other | Admitting: Internal Medicine

## 2012-04-02 ENCOUNTER — Ambulatory Visit (INDEPENDENT_AMBULATORY_CARE_PROVIDER_SITE_OTHER)
Admission: RE | Admit: 2012-04-02 | Discharge: 2012-04-02 | Disposition: A | Discharge status: Home / Self Care | Payer: Medicare Other | Source: Ambulatory Visit | Attending: Internal Medicine | Admitting: Internal Medicine

## 2012-04-02 ENCOUNTER — Telehealth: Payer: Self-pay | Admitting: *Deleted

## 2012-04-02 ENCOUNTER — Other Ambulatory Visit: Payer: Self-pay | Admitting: *Deleted

## 2012-04-02 DIAGNOSIS — R109 Unspecified abdominal pain: Secondary | ICD-10-CM | POA: Diagnosis not present

## 2012-04-02 DIAGNOSIS — K551 Chronic vascular disorders of intestine: Secondary | ICD-10-CM

## 2012-04-02 DIAGNOSIS — R112 Nausea with vomiting, unspecified: Secondary | ICD-10-CM

## 2012-04-02 DIAGNOSIS — N2889 Other specified disorders of kidney and ureter: Secondary | ICD-10-CM | POA: Diagnosis not present

## 2012-04-02 DIAGNOSIS — R634 Abnormal weight loss: Secondary | ICD-10-CM

## 2012-04-02 HISTORY — DX: Nausea with vomiting, unspecified: R11.2

## 2012-04-02 MED ORDER — IOHEXOL 350 MG/ML SOLN
100.0000 mL | Freq: Once | INTRAVENOUS | Status: AC | PRN
  Administered 2012-04-02: 100 mL via INTRAVENOUS

## 2012-04-02 NOTE — Telephone Encounter (Signed)
Received a call from Dr. Estanislado Spire office that patient is scheduled on 04/27/12 at 10:30 AM for consult. Dr. Juanda Chance, will this be soon enough for patient?

## 2012-04-02 NOTE — Telephone Encounter (Signed)
Yes, it is soon enough but I would like to see her before that in the office. Thanx

## 2012-04-03 ENCOUNTER — Other Ambulatory Visit (INDEPENDENT_AMBULATORY_CARE_PROVIDER_SITE_OTHER): Payer: Medicare Other

## 2012-04-03 ENCOUNTER — Other Ambulatory Visit: Payer: Self-pay | Admitting: Internal Medicine

## 2012-04-03 ENCOUNTER — Encounter: Payer: Self-pay | Admitting: Internal Medicine

## 2012-04-03 ENCOUNTER — Ambulatory Visit (AMBULATORY_SURGERY_CENTER): Payer: Medicare Other | Admitting: Internal Medicine

## 2012-04-03 VITALS — BP 137/68 | HR 78 | Temp 96.8°F | Resp 30 | Ht 66.0 in | Wt 146.0 lb

## 2012-04-03 DIAGNOSIS — R933 Abnormal findings on diagnostic imaging of other parts of digestive tract: Secondary | ICD-10-CM | POA: Diagnosis not present

## 2012-04-03 DIAGNOSIS — K589 Irritable bowel syndrome without diarrhea: Secondary | ICD-10-CM | POA: Diagnosis not present

## 2012-04-03 DIAGNOSIS — E039 Hypothyroidism, unspecified: Secondary | ICD-10-CM | POA: Diagnosis not present

## 2012-04-03 DIAGNOSIS — R634 Abnormal weight loss: Secondary | ICD-10-CM | POA: Diagnosis not present

## 2012-04-03 DIAGNOSIS — R935 Abnormal findings on diagnostic imaging of other abdominal regions, including retroperitoneum: Secondary | ICD-10-CM

## 2012-04-03 DIAGNOSIS — R109 Unspecified abdominal pain: Secondary | ICD-10-CM | POA: Diagnosis not present

## 2012-04-03 DIAGNOSIS — I10 Essential (primary) hypertension: Secondary | ICD-10-CM | POA: Diagnosis not present

## 2012-04-03 DIAGNOSIS — K2960 Other gastritis without bleeding: Secondary | ICD-10-CM

## 2012-04-03 DIAGNOSIS — K219 Gastro-esophageal reflux disease without esophagitis: Secondary | ICD-10-CM | POA: Diagnosis not present

## 2012-04-03 DIAGNOSIS — K222 Esophageal obstruction: Secondary | ICD-10-CM | POA: Diagnosis not present

## 2012-04-03 DIAGNOSIS — J45909 Unspecified asthma, uncomplicated: Secondary | ICD-10-CM | POA: Diagnosis not present

## 2012-04-03 DIAGNOSIS — R748 Abnormal levels of other serum enzymes: Secondary | ICD-10-CM

## 2012-04-03 DIAGNOSIS — D1330 Benign neoplasm of unspecified part of small intestine: Secondary | ICD-10-CM

## 2012-04-03 DIAGNOSIS — D131 Benign neoplasm of stomach: Secondary | ICD-10-CM

## 2012-04-03 DIAGNOSIS — R112 Nausea with vomiting, unspecified: Secondary | ICD-10-CM

## 2012-04-03 MED ORDER — PROMETHAZINE HCL 25 MG PO TABS: 12.5000 mg | ORAL_TABLET | Freq: Four times a day (QID) | ORAL | Status: DC | PRN

## 2012-04-03 MED ORDER — SODIUM CHLORIDE 0.9 % IV SOLN: 500.0000 mL | INTRAVENOUS | Status: DC

## 2012-04-03 NOTE — Op Note (Signed)
[facility] [address] [place], [address]   ENDOSCOPY PROCEDURE REPORT  PATIENT: Megan, Foster  MR#: #[id number] BIRTHDATE: [birth year] , 84  yrs. old GENDER: Female ENDOSCOPIST: Hart Carwin, MD REFERRED BY:  Kari Baars PROCEDURE DATE:  04/03/2012 PROCEDURE:  EGD w/ biopsy ASA CLASS:     Class III INDICATIONS:  abdominal pain, weight loss 167- 146 lbs, abnormal CT angio, ? gall stone,, UGI series- es. dismotility MEDICATIONS: CRNA, Propofol 120 mg IV TOPICAL ANESTHETIC: none  DESCRIPTION OF PROCEDURE: After the risks benefits and alternatives of the procedure were thoroughly explained, informed consent was obtained.  The LB GIF-H180 [id number] endoscope was introduced through the mouth and advanced to the second portion of the duodenum. Without limitations.  The instrument was slowly withdrawn as the mucosa was fully examined.        STOMACH: Mild gastritis (inflammation) was found.  A biopsy was performed using cold forceps.  Sample obtained for helicobacter pylori testing. Biopsies were taken from the descending duodenum Retroflexed views revealed no abnormalities.     The scope was then withdrawn from the patient and the procedure completed.There was a small 2 cm hiatal hernia  COMPLICATIONS: There were no complications. ENDOSCOPIC IMPRESSION: Gastritis (inflammation) was found; biopsy s/p small bowl biopsies small 2 cm hiatal hernia  RECOMMENDATIONS: 1.  Await biopsy results 2.   low residue diet, small feedings HIDA scan Appointment with Dr Myra Gianotti April 27, 2012, will try to move it down in  view of her symptoms of weight loss, N&V It is not clear at this point if her Sx's are predomonanetle from mesenteric ischemis or if there is a contribution of biliary dysfunction,  REPEAT EXAM: no  eSigned:  Hart Carwin, MD 04/03/2012 11:07 AM   CC:  PATIENT NAME:  Megan, Foster MR#: #[id number]

## 2012-04-03 NOTE — Progress Notes (Signed)
Called to room to assist during endoscopic procedure.  Patient ID and intended procedure confirmed with present staff. Received instructions for my participation in the procedure from the performing physician.  

## 2012-04-03 NOTE — Telephone Encounter (Signed)
Left a message for patient to call me. 

## 2012-04-03 NOTE — Progress Notes (Signed)
Report to pacu rn, vss, bbs=clear 

## 2012-04-03 NOTE — Progress Notes (Addendum)
Patient did not have preoperative order for IV antibiotic SSI prophylaxis. (G8918)  Patient did not experience any of the following events: a burn prior to discharge; a fall within the facility; wrong site/side/patient/procedure/implant event; or a hospital transfer or hospital admission upon discharge from the facility. (G8907)  

## 2012-04-03 NOTE — Patient Instructions (Addendum)
YOU HAD AN ENDOSCOPIC PROCEDURE TODAY AT THE [facility]: Refer to the procedure report that was given to you for any specific questions about what was found during the examination.  If the procedure report does not answer your questions, please call your gastroenterologist to clarify.  If you requested that your care partner not be given the details of your procedure findings, then the procedure report has been included in a sealed envelope for you to review at your convenience later.  YOU SHOULD EXPECT: Some feelings of bloating in the abdomen. Passage of more gas than usual.  Walking can help get rid of the air that was put into your GI tract during the procedure and reduce the bloating. If you had a lower endoscopy (such as a colonoscopy or flexible sigmoidoscopy) you may notice spotting of blood in your stool or on the toilet paper. If you underwent a bowel prep for your procedure, then you may not have a normal bowel movement for a few days.  DIET:Dr. Juanda Chance would like for you to eat a low fiber diet.  Drink plenty of fluids but you should avoid alcoholic beverages for 24 hours.  ACTIVITY: Your care partner should take you home directly after the procedure.  You should plan to take it easy, moving slowly for the rest of the day.  You can resume normal activity the day after the procedure however you should NOT DRIVE or use heavy machinery for 24 hours (because of the sedation medicines used during the test).    SYMPTOMS TO REPORT IMMEDIATELY: A gastroenterologist can be reached at any hour.  During normal business hours, 8:30 AM to 5:00 PM Monday through Friday, call [phone].  After hours and on weekends, please call the GI answering service at [phone] who will take a message and have the physician on call contact you.   Following upper endoscopy (EGD)  Vomiting of blood or coffee ground material  New chest pain or pain under the shoulder blades  Painful or  persistently difficult swallowing  New shortness of breath  Fever of 100F or higher  Black, tarry-looking stools  FOLLOW UP: If any biopsies were taken you will be contacted by phone or by letter within the next 1-3 weeks.  Call your gastroenterologist if you have not heard about the biopsies in 3 weeks.  Our staff will call the home number listed on your records the next business day following your procedure to check on you and address any questions or concerns that you may have at that time regarding the information given to you following your procedure. This is a courtesy call and so if there is no answer at the home number and we have not heard from you through the emergency physician on call, we will assume that you have returned to your regular daily activities without incident.  SIGNATURES/CONFIDENTIALITY: You and/or your care partner have signed paperwork which will be entered into your electronic medical record.  These signatures attest to the fact that that the information above on your After Visit Summary has been reviewed and is understood.  Full responsibility of the confidentiality of this discharge information lies with you and/or your care-partner.  Rene Kocher who is Dr. Regino Schultze nurse from the 3rd floor will arrange a HYDA scan for you.  She will also try to move up your appointment with Dr.Brabham.

## 2012-04-06 ENCOUNTER — Telehealth: Payer: Self-pay

## 2012-04-06 ENCOUNTER — Encounter (HOSPITAL_COMMUNITY)
Admission: RE | Admit: 2012-04-06 | Discharge: 2012-04-06 | Disposition: A | Discharge status: Home / Self Care | Payer: Medicare Other | Source: Ambulatory Visit | Attending: Internal Medicine | Admitting: Internal Medicine

## 2012-04-06 DIAGNOSIS — R112 Nausea with vomiting, unspecified: Secondary | ICD-10-CM | POA: Diagnosis not present

## 2012-04-06 DIAGNOSIS — R748 Abnormal levels of other serum enzymes: Secondary | ICD-10-CM | POA: Insufficient documentation

## 2012-04-06 DIAGNOSIS — R935 Abnormal findings on diagnostic imaging of other abdominal regions, including retroperitoneum: Secondary | ICD-10-CM | POA: Diagnosis not present

## 2012-04-06 DIAGNOSIS — K838 Other specified diseases of biliary tract: Secondary | ICD-10-CM | POA: Diagnosis not present

## 2012-04-06 LAB — CELIAC PANEL 10
Endomysial Screen: NEGATIVE
Gliadin IgA: 8.4 U/mL (ref <20)
IgA: 299 mg/dL (ref 69–380)
Tissue Transglutaminase Ab, IgA: 6.1 U/mL (ref <20)

## 2012-04-06 MED ORDER — TECHNETIUM TC 99M MEBROFENIN IV KIT
4.9000 | PACK | Freq: Once | INTRAVENOUS | Status: AC | PRN
  Administered 2012-04-06: 5 via INTRAVENOUS

## 2012-04-06 NOTE — Telephone Encounter (Signed)
  Follow up Call-  Call back number 04/03/2012  Post procedure Call Back phone  # [phone]  Permission to leave phone message Yes     Patient questions:  Do you have a fever, pain , or abdominal swelling? no Pain Score  0 *  Have you tolerated food without any problems? yes  Have you been able to return to your normal activities? yes  Do you have any questions about your discharge instructions: Diet   no Medications  no Follow up visit  no  Do you have questions or concerns about your Care? no  Actions: * If pain score is 4 or above: No action needed, pain <4.

## 2012-04-07 ENCOUNTER — Encounter: Payer: Self-pay | Admitting: Internal Medicine

## 2012-04-07 NOTE — Telephone Encounter (Signed)
Patient had a procedure and was seen by MD

## 2012-04-13 ENCOUNTER — Encounter: Payer: Self-pay | Admitting: Vascular Surgery

## 2012-04-13 ENCOUNTER — Other Ambulatory Visit: Payer: Self-pay

## 2012-04-13 ENCOUNTER — Ambulatory Visit (INDEPENDENT_AMBULATORY_CARE_PROVIDER_SITE_OTHER): Payer: Medicare Other | Admitting: Vascular Surgery

## 2012-04-13 VITALS — BP 134/85 | HR 102 | Resp 20 | Ht 66.0 in | Wt 141.0 lb

## 2012-04-13 DIAGNOSIS — K551 Chronic vascular disorders of intestine: Secondary | ICD-10-CM | POA: Insufficient documentation

## 2012-04-13 NOTE — Progress Notes (Signed)
Subjective:     Patient ID: Megan Foster, female   DOB: 06/15/1927, 79 y.o.   MRN: [id number]  HPIthis 79-year-old female is referred by Dr. Dura Brodie for possible intestinal ischemia. Patient has been having abdominal discomfort nausea and vomiting since October 2013 with 20 pound weight loss. The pain is mostly postprandial but she can also awaken with abdominal discomfort at times. She has been very weak over the past 3-4 months. She's had no significant change in her bowel habits. She had a normal "HIDA scan and recently had a CT angiogram performed which I have reviewed. This revealed a focal SMA stenosis.  Past Medical History  Diagnosis Date  . Asthma   . Hypertension   . GERD (gastroesophageal reflux disease)   . Hypothyroid   . Irritable bowel syndrome   . Tachycardia   . Sinusitis   . Hyperplastic polyps of stomach   . Hiatal hernia   . Esophageal dysmotility   . Headache   . Osteoporosis   . Elevated LFTs   . Shingles   . Vitamin D deficiency   . Depression   . Arthritis   . Hypertension   . GERD (gastroesophageal reflux disease)   . Nausea & vomiting 04/02/2012    History  Substance Use Topics  . Smoking status: Never Smoker   . Smokeless tobacco: Never Used  . Alcohol Use: No     Comment: <1 day    Family History  Problem Relation Age of Onset  . Hypertension Mother   . Heart attack Father 69  . Heart failure Sister   . Hypertension Brother   . Hypertension Brother   . Breast cancer    . Ovarian cancer    . Prostate cancer Maternal Uncle   . Stomach cancer Sister   . Colon polyps Sister     siblings  . Colon cancer Sister     Maternal Aunt x3 Maternal Uncle x 2    No Known Allergies  Current outpatient prescriptions:Acetaminophen (TYLENOL PO), Take by mouth as needed.  , Disp: , Rfl: ;  Albuterol Sulfate (PROAIR HFA IN), Inhale into the lungs as needed., Disp: , Rfl: ;  aspirin 81 MG tablet, Take 81 mg by mouth daily.  , Disp: , Rfl: ;   budesonide-formoterol (SYMBICORT) 160-4.5 MCG/ACT inhaler, Inhale 2 puffs into the lungs 2 (two) times daily., Disp: , Rfl: ;  CALCIUM PO, Take by mouth 2 (two) times daily.  , Disp: , Rfl:  Cholecalciferol (VITAMIN D PO), Take by mouth once a week. , Disp: , Rfl: ;  dicyclomine (BENTYL) 10 MG capsule, Take 1 capsule (10 mg total) by mouth 4 (four) times daily -  before meals and at bedtime., Disp: 90 capsule, Rfl: 1;  fluticasone (FLONASE) 50 MCG/ACT nasal spray, Place 2 sprays into the nose daily., Disp: 1 g, Rfl: 0;  levocetirizine (XYZAL) 5 MG tablet, Take 5 mg by mouth every evening., Disp: , Rfl:  levothyroxine (SYNTHROID, LEVOTHROID) 50 MCG tablet, Take 50 mcg by mouth daily.  , Disp: , Rfl: ;  metoprolol succinate (TOPROL XL) 50 MG 24 hr tablet, Take 1 tablet (50 mg total) by mouth daily., Disp: 30 tablet, Rfl: 6;  Montelukast Sodium (SINGULAIR PO), Take by mouth daily.  , Disp: , Rfl: ;  Multiple Vitamin (MULTI-VITAMIN PO), Take by mouth daily.  , Disp: , Rfl: ;  NON FORMULARY, anthistamine 1 tab po qd, Disp: , Rfl:  pantoprazole (PROTONIX) 40 MG tablet, Take   40 mg by mouth 2 (two) times daily., Disp: , Rfl: ;  Probiotic Product (ALIGN) 4 MG CAPS, Take by mouth., Disp: , Rfl: ;  promethazine (PHENERGAN) 25 MG tablet, Take 0.5 tablets (12.5 mg total) by mouth every 6 (six) hours as needed for nausea., Disp: 30 tablet, Rfl: 0;  Simethicone (MYLANTA GAS PO), Take by mouth., Disp: , Rfl:   BP 134/85  Pulse 102  Resp 20  Ht 5' 6" (1.676 m)  Wt 141 lb (63.957 kg)  BMI 22.77 kg/m2  Body mass index is 22.77 kg/(m^2).           Review of Systems Denies chest pain, dyspnea on exertion, PND, orthopnea, chronic bronchitis. Does have history of irritable bowel syndrome. Had normal upper GI endoscopy recently      Objective:   Physical Examblood pressure 134/85 heart rate 102 respirations 20 Gen.-alert and oriented x3 in no apparent distress HEENT normal for age Lungs no rhonchi or  wheezing Cardiovascular regular rhythm no murmurs carotid pulses 3+ palpable no bruits audible Abdomen soft nontender no palpable masses-No bruits her Musculoskeletal free of  major deformities Skin clear -no rashes Neurologic normal Lower extremities 3+ femoral and dorsalis pedis pulses palpable bilaterally with no edema  Today I reviewed her CT angiogram of the abdomen and pelvis which was done a week ago. Her aorta appears relatively normal as does her celiac axis, renal arteries, and IMA       Assessment:     . 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 Brian Chen Thursday of this week Discussed with patient and her daughter that this is not definitely the cause of her abdominal pain but agreed that this does need to be treated in view of her symptoms       

## 2012-04-14 ENCOUNTER — Encounter (HOSPITAL_COMMUNITY): Payer: Self-pay | Admitting: Respiratory Therapy

## 2012-04-15 ENCOUNTER — Encounter: Payer: Medicare Other | Admitting: Vascular Surgery

## 2012-04-16 ENCOUNTER — Telehealth: Payer: Self-pay | Admitting: Vascular Surgery

## 2012-04-16 ENCOUNTER — Encounter (HOSPITAL_COMMUNITY)
Admission: RE | Disposition: A | Discharge status: Home / Self Care | Payer: Self-pay | Source: Ambulatory Visit | Attending: Vascular Surgery

## 2012-04-16 ENCOUNTER — Ambulatory Visit (HOSPITAL_COMMUNITY)
Admission: RE | Admit: 2012-04-16 | Discharge: 2012-04-16 | Disposition: A | Discharge status: Home / Self Care | Payer: Medicare Other | Source: Ambulatory Visit | Attending: Vascular Surgery | Admitting: Vascular Surgery

## 2012-04-16 DIAGNOSIS — R634 Abnormal weight loss: Secondary | ICD-10-CM | POA: Diagnosis not present

## 2012-04-16 DIAGNOSIS — R112 Nausea with vomiting, unspecified: Secondary | ICD-10-CM | POA: Diagnosis not present

## 2012-04-16 DIAGNOSIS — I998 Other disorder of circulatory system: Secondary | ICD-10-CM | POA: Insufficient documentation

## 2012-04-16 DIAGNOSIS — R109 Unspecified abdominal pain: Secondary | ICD-10-CM | POA: Insufficient documentation

## 2012-04-16 DIAGNOSIS — K551 Chronic vascular disorders of intestine: Secondary | ICD-10-CM

## 2012-04-16 DIAGNOSIS — K219 Gastro-esophageal reflux disease without esophagitis: Secondary | ICD-10-CM | POA: Diagnosis not present

## 2012-04-16 HISTORY — PX: AORTOGRAM: SHX6300

## 2012-04-16 LAB — POCT I-STAT, CHEM 8
Calcium, Ion: 1.16 mmol/L (ref 1.13–1.30)
Glucose, Bld: 99 mg/dL (ref 70–99)
HCT: 47 % — ABNORMAL HIGH (ref 36.0–46.0)
Hemoglobin: 16 g/dL — ABNORMAL HIGH (ref 12.0–15.0)

## 2012-04-16 LAB — POCT ACTIVATED CLOTTING TIME: Activated Clotting Time: 170 seconds

## 2012-04-16 SURGERY — VISCERAL ANGIOGRAM

## 2012-04-16 MED ORDER — SODIUM CHLORIDE 0.9 % IV SOLN
INTRAVENOUS | Status: DC
  Administered 2012-04-16: 09:00:00 via INTRAVENOUS

## 2012-04-16 MED ORDER — SODIUM CHLORIDE 0.9 % IV SOLN: 1.0000 mL/kg/h | INTRAVENOUS | Status: DC

## 2012-04-16 MED ORDER — HEPARIN (PORCINE) IN NACL 2-0.9 UNIT/ML-% IJ SOLN
INTRAMUSCULAR | Status: AC
  Filled 2012-04-16: qty 1000

## 2012-04-16 MED ORDER — HEPARIN SODIUM (PORCINE) 1000 UNIT/ML IJ SOLN
INTRAMUSCULAR | Status: AC
  Filled 2012-04-16: qty 1

## 2012-04-16 MED ORDER — CLOPIDOGREL BISULFATE 75 MG PO TABS: 75.0000 mg | ORAL_TABLET | Freq: Every day | ORAL | Status: DC

## 2012-04-16 MED ORDER — ACETAMINOPHEN 325 MG PO TABS: 650.0000 mg | ORAL_TABLET | ORAL | Status: DC | PRN

## 2012-04-16 MED ORDER — ONDANSETRON HCL 4 MG/2ML IJ SOLN: 4.0000 mg | Freq: Four times a day (QID) | INTRAMUSCULAR | Status: DC | PRN

## 2012-04-16 MED ORDER — LIDOCAINE HCL (PF) 1 % IJ SOLN
INTRAMUSCULAR | Status: AC
  Filled 2012-04-16: qty 30

## 2012-04-16 NOTE — Telephone Encounter (Signed)
Message copied by Margaretmary Eddy on Thu Apr 16, 2012  4:20 PM ------      Message from: Melene Plan      Created: Thu Apr 16, 2012  1:03 PM                   ----- Message -----         From: Fransisco Hertz, MD         Sent: 04/16/2012  12:43 PM           To: Reuel Derby, Melene Plan, RN            Lorrayne Ismael      [id number]      [birth year]            PROCEDURE:      1.  Right common femoral artery cannulation under ultrasound guidance      2.  Aortogram (anteroposterior and lateral)      3.  Superior mesenteric artery selection (first order)      4.  Superior mesenteric artery angiogram      5.  Angioplasty of superior mesenteric artery x 2 (4 mm x 20 mm, 5 mm x 20 mm)            Follow-up: 4 weeks with Dr. Hart Rochester ------

## 2012-04-16 NOTE — H&P (View-Only) (Signed)
Subjective:     Patient ID: Megan Foster, female   DOB: 1927/03/30, 79 y.o.   MRN: [id number]  HPIthis 79 year old female is referred by Dr. Felicity Pellegrini for possible intestinal ischemia. Patient has been having abdominal discomfort nausea and vomiting since October 2013 with 20 pound weight loss. The pain is mostly postprandial but she can also awaken with abdominal discomfort at times. She has been very weak over the past 3-4 months. She's had no significant change in her bowel habits. She had a normal "HIDA scan and recently had a CT angiogram performed which I have reviewed. This revealed a focal SMA stenosis.  Past Medical History  Diagnosis Date  . Asthma   . Hypertension   . GERD (gastroesophageal reflux disease)   . Hypothyroid   . Irritable bowel syndrome   . Tachycardia   . Sinusitis   . Hyperplastic polyps of stomach   . Hiatal hernia   . Esophageal dysmotility   . Headache   . Osteoporosis   . Elevated LFTs   . Shingles   . Vitamin D deficiency   . Depression   . Arthritis   . Hypertension   . GERD (gastroesophageal reflux disease)   . Nausea & vomiting 04/02/2012    History  Substance Use Topics  . Smoking status: Never Smoker   . Smokeless tobacco: Never Used  . Alcohol Use: No     Comment: <1 day    Family History  Problem Relation Age of Onset  . Hypertension Mother   . Heart attack Father 59  . Heart failure Sister   . Hypertension Brother   . Hypertension Brother   . Breast cancer    . Ovarian cancer    . Prostate cancer Maternal Uncle   . Stomach cancer Sister   . Colon polyps Sister     siblings  . Colon cancer Sister     Maternal Aunt x3 Maternal Uncle x 2    No Known Allergies  Current outpatient prescriptions:Acetaminophen (TYLENOL PO), Take by mouth as needed.  , Disp: , Rfl: ;  Albuterol Sulfate (PROAIR HFA IN), Inhale into the lungs as needed., Disp: , Rfl: ;  aspirin 81 MG tablet, Take 81 mg by mouth daily.  , Disp: , Rfl: ;   budesonide-formoterol (SYMBICORT) 160-4.5 MCG/ACT inhaler, Inhale 2 puffs into the lungs 2 (two) times daily., Disp: , Rfl: ;  CALCIUM PO, Take by mouth 2 (two) times daily.  , Disp: , Rfl:  Cholecalciferol (VITAMIN D PO), Take by mouth once a week. , Disp: , Rfl: ;  dicyclomine (BENTYL) 10 MG capsule, Take 1 capsule (10 mg total) by mouth 4 (four) times daily -  before meals and at bedtime., Disp: 90 capsule, Rfl: 1;  fluticasone (FLONASE) 50 MCG/ACT nasal spray, Place 2 sprays into the nose daily., Disp: 1 g, Rfl: 0;  levocetirizine (XYZAL) 5 MG tablet, Take 5 mg by mouth every evening., Disp: , Rfl:  levothyroxine (SYNTHROID, LEVOTHROID) 50 MCG tablet, Take 50 mcg by mouth daily.  , Disp: , Rfl: ;  metoprolol succinate (TOPROL XL) 50 MG 24 hr tablet, Take 1 tablet (50 mg total) by mouth daily., Disp: 30 tablet, Rfl: 6;  Montelukast Sodium (SINGULAIR PO), Take by mouth daily.  , Disp: , Rfl: ;  Multiple Vitamin (MULTI-VITAMIN PO), Take by mouth daily.  , Disp: , Rfl: ;  NON FORMULARY, anthistamine 1 tab po qd, Disp: , Rfl:  pantoprazole (PROTONIX) 40 MG tablet, Take  40 mg by mouth 2 (two) times daily., Disp: , Rfl: ;  Probiotic Product (ALIGN) 4 MG CAPS, Take by mouth., Disp: , Rfl: ;  promethazine (PHENERGAN) 25 MG tablet, Take 0.5 tablets (12.5 mg total) by mouth every 6 (six) hours as needed for nausea., Disp: 30 tablet, Rfl: 0;  Simethicone (MYLANTA GAS PO), Take by mouth., Disp: , Rfl:   BP 134/85  Pulse 102  Resp 20  Ht 5\' 6"  (1.676 m)  Wt 141 lb (63.957 kg)  BMI 22.77 kg/m2  Body mass index is 22.77 kg/(m^2).           Review of Systems Denies chest pain, dyspnea on exertion, PND, orthopnea, chronic bronchitis. Does have history of irritable bowel syndrome. Had normal upper GI endoscopy recently      Objective:   Physical Examblood pressure 134/85 heart rate 102 respirations 20 Gen.-alert and oriented x3 in no apparent distress HEENT normal for age Lungs no rhonchi or  wheezing Cardiovascular regular rhythm no murmurs carotid pulses 3+ palpable no bruits audible Abdomen soft nontender no palpable masses-No bruits her Musculoskeletal free of  major deformities Skin clear -no rashes Neurologic normal Lower extremities 3+ femoral and dorsalis pedis pulses palpable bilaterally with no edema  Today I reviewed her CT angiogram of the abdomen and pelvis which was done a week ago. Her aorta appears relatively normal as does her celiac axis, renal arteries, and IMA       Assessment:     . 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 Leonides Sake Thursday of this week Discussed with patient and her daughter that this is not definitely the cause of her abdominal pain but agreed that this does need to be treated in view of her symptoms

## 2012-04-16 NOTE — Telephone Encounter (Signed)
Lvm, sent letter - kf °

## 2012-04-16 NOTE — Interval H&P Note (Signed)
[facility]  History and Physical Update  The patient was interviewed and re-examined.  The patient's previous History and Physical has been reviewed and is unchanged from Dr. Hart Rochester on: 04/13/12.  There is no change in the plan of care: Aortogram, SMA angiogram, and possible intervention.  Leonides Sake, MD [facility] Office: [phone] Pager: [phone]  04/16/2012, 8:24 AM

## 2012-04-16 NOTE — Op Note (Signed)
OPERATIVE NOTE   PROCEDURE: 1.  Right common femoral artery cannulation under ultrasound guidance 2.  Aortogram (anteroposterior and lateral) 3.  Superior mesenteric artery selection (first order) 4.  Superior mesenteric artery angiogram 5.  Angioplasty of superior mesenteric artery x 2 (4 mm x 20 mm, 5 mm x 20 mm)  PRE-OPERATIVE DIAGNOSIS: possible chronic mesenteric ischemia  POST-OPERATIVE DIAGNOSIS: same as above   SURGEON: Leonides Sake, MD  ANESTHESIA: conscious sedation  ESTIMATED BLOOD LOSS: 50 cc  CONTRAST: 75 cc  FINDING(S):  Aorta: widely patent Celiac artery: widely patent  Superior mesenteric artery: patent, ~50% stenosis ~1-2 cm distal to takeoff (50 mm Hg gradient): near resolved after serial angioplasty  Inferior mesenteric artery: patent but small   Right Left  RA Patent Patent  CIA Patent Patent  EIA Patent Patent  IIA Patent Patent   SPECIMEN(S):  none  INDICATIONS:   Megan Foster is a 79 y.o. female who presents with possible chronic mesenteric ischemia.  Dr. Hart Rochester felt an mesenteric angiogram and possible intervention was indicated to treat a possible superior mesenteric artery stenosis.  The patient presents for: aortogram, mesenteric angiogram, and possible intervention.  I discussed with the patient the nature of angiographic procedures, especially the limited patencies of any endovascular intervention.  The patient is aware of that the risks of an angiographic procedure include but are not limited to: bleeding, infection, access site complications, renal failure, embolization, rupture of vessel, dissection, possible need for emergent surgical intervention, possible need for surgical procedures to treat the patient's pathology, and stroke and death.  The patient is aware of the risks and agrees to proceed.  DESCRIPTION: After full informed consent was obtained from the patient, the patient was brought back to the angiography suite.  The patient was  placed supine upon the angiography table and connected to monitoring equipment.  The patient was then given conscious sedation, the amounts of which are documented in the patient's chart.  The patient was prepped and drape in the standard fashion for an angiographic procedure.  At this point, attention was turned to the right groin.  Under ultrasound guidance, the right common femoral artery will be cannulated with a 18 gauge needle.  The [facility] wire was passed up into the aorta.  The needle was exchanged for a 6-Fr sheath, which was advanced over the wire into the common femoral artery.  The dilator was then removed.  The Omniflush catheter was then loaded over the wire up to the level of T12.  The catheter was connected to the power injector circuit.  After de-airring and de-clotting the circuit, a power injector aortogram was completed in anteroposterior and lateral projections.  Based on the images, I felt a gradient measurement would be necessary as the lateral projection does not demonstrate a hemodynamically significant stenosis.  I exchanged the catheter for multiple catheters to try to select the superior mesenteric artery.    Due to the angle, it was easy to select the superior mesenteric artery but difficult to pass any catheters distal to the stenosis.  Eventually with a Versacore and SOS catheter, i was able to select the superior mesenteric artery and pass the catheter distally into the superior mesenteric artery.  Unfortunately, the tip of the catheter was obturating on the vessel walls, so I had to exchanged the catheter for an endhole catheter.  The wire was removed and the catheter connected to the pressure transducer.  I completed a pull back gradient which was found to  be 50 mm Hg.  Subsequently, I felt intervention was going to be necessary.  The patient was given 5000 units of Heparin intravenously, which was a therapeutic bolus.  I loaded a 6-Fr IMA guide over the SOS catheter over the wire.   I reselected the superior mesenteric artery with the SOS and wire and then advanced the IMA guide into the orifice of the superior mesenteric artery.  The wire was exchanged for 0.018" stabilizer wire, which was advanced past the first order branches.  I loaded a 4 mm x 20 mm angioplasty balloon over the wire in the superior mesenteric artery and then pulled back the guide to uncover the balloon.  I did a hand injectior to verify the balloon was centered on the stenosis.  The balloon was inflated to 10 atm for 2 minutes.  Completion imaging demonstrated continued stenosis >30%.  I recaptured the balloon with the IMA guide and then exchanged the balloon for a 5 mm x 20 mm balloon.  It was centered on the stenosis.  The guide was pulled back.  The balloon was inflated to 10 atm for 2 minutes.  Completion imaging demonstrated near resolution of the stenosis.  I pulled out the balloon and then removed the guide with wire together.  The sheath was aspirated.  No clots were present and the sheath was reloaded with heparinized saline.  The plan is to removed the sheath once anticoagulation has resolved.  COMPLICATIONS: none  CONDITION: stable   Leonides Sake, MD [facility] Office: [phone] Pager: [phone]  04/16/2012, 12:27 PM

## 2012-04-27 ENCOUNTER — Encounter: Payer: Medicare Other | Admitting: Surgery

## 2012-05-04 ENCOUNTER — Telehealth: Payer: Self-pay | Admitting: Internal Medicine

## 2012-05-04 ENCOUNTER — Other Ambulatory Visit: Payer: Self-pay | Admitting: Internal Medicine

## 2012-05-04 NOTE — Telephone Encounter (Signed)
We can put her on Megace ,5 cc=200mg ( 40mg /cc), 1 tsp po qd,,disp 12 oz, 1 refill

## 2012-05-04 NOTE — Telephone Encounter (Signed)
Spoke with Joyce Gross patient's daughter. Patient has seen Dr. Hart Rochester and had the procedure on 04/16/12. Her abdominal pain is gone but she does not have an appetite. Daughter states she feels weak and is not active like her usual. Weight per patient is 135 lbs. Daughter is asking what Dr. Juanda Chance thinks about an appetite stimulant. Please, advise.

## 2012-05-05 MED ORDER — MEGESTROL ACETATE 40 MG/ML PO SUSP: ORAL | Status: DC

## 2012-05-11 ENCOUNTER — Encounter: Payer: Self-pay | Admitting: Vascular Surgery

## 2012-05-12 ENCOUNTER — Encounter: Payer: Self-pay | Admitting: Vascular Surgery

## 2012-05-12 ENCOUNTER — Ambulatory Visit (INDEPENDENT_AMBULATORY_CARE_PROVIDER_SITE_OTHER): Payer: Medicare Other | Admitting: Vascular Surgery

## 2012-05-12 VITALS — BP 110/70 | HR 102 | Resp 16 | Ht 66.5 in | Wt 139.0 lb

## 2012-05-12 DIAGNOSIS — K551 Chronic vascular disorders of intestine: Secondary | ICD-10-CM | POA: Diagnosis not present

## 2012-05-12 NOTE — Progress Notes (Signed)
Subjective:     Patient ID: Megan Foster, female   DOB: 23-Jul-1927, 79 y.o.   MRN: [id number]  HPI this 79 year old female returns for initial followup regarding her PTA of her SMA performed by Dr. Standley Brooking a few weeks ago. She had an approximate 50-60% stenosis of her SMA which was treated with balloon angioplasty. Patient had a history compatible with mesenteric ischemia with 20 pound weight loss and abdominal pain. Since that time she states the abdominal pain is now much much improved although she has lost 2 more pounds. Her abdomen feels better she states. Her bowel habits are normal.  Past Medical History  Diagnosis Date  . Asthma   . Hypertension   . GERD (gastroesophageal reflux disease)   . Hypothyroid   . Irritable bowel syndrome   . Tachycardia   . Sinusitis   . Hyperplastic polyps of stomach   . Hiatal hernia   . Esophageal dysmotility   . Headache   . Osteoporosis   . Elevated LFTs   . Shingles   . Vitamin D deficiency   . Depression   . Arthritis   . Hypertension   . GERD (gastroesophageal reflux disease)   . Nausea & vomiting 04/02/2012    History  Substance Use Topics  . Smoking status: Never Smoker   . Smokeless tobacco: Never Used  . Alcohol Use: No     Comment: <1 day    Family History  Problem Relation Age of Onset  . Hypertension Mother   . Heart attack Father 61  . Heart failure Sister   . Hypertension Brother   . Hypertension Brother   . Breast cancer    . Ovarian cancer    . Prostate cancer Maternal Uncle   . Stomach cancer Sister   . Colon polyps Sister     siblings  . Colon cancer Sister     Maternal Aunt x3 Maternal Uncle x 2    No Known Allergies  Current outpatient prescriptions:acetaminophen (TYLENOL) 500 MG tablet, Take 500 mg by mouth every 6 (six) hours as needed for pain., Disp: , Rfl: ;  Albuterol Sulfate (PROAIR HFA IN), Inhale 2 puffs into the lungs every 6 (six) hours as needed (for shortness of breath). , Disp: , Rfl: ;   budesonide-formoterol (SYMBICORT) 160-4.5 MCG/ACT inhaler, Inhale 2 puffs into the lungs 2 (two) times daily., Disp: , Rfl:  CALCIUM PO, Take 1 tablet by mouth daily. , Disp: , Rfl: ;  clopidogrel (PLAVIX) 75 MG tablet, Take 1 tablet (75 mg total) by mouth daily., Disp: 30 tablet, Rfl: 11;  dicyclomine (BENTYL) 10 MG capsule, Take 1 capsule (10 mg total) by mouth 4 (four) times daily -  before meals and at bedtime., Disp: 90 capsule, Rfl: 1;  levocetirizine (XYZAL) 5 MG tablet, Take 5 mg by mouth every evening., Disp: , Rfl:  levothyroxine (SYNTHROID, LEVOTHROID) 75 MCG tablet, Take 75 mcg by mouth daily., Disp: , Rfl: ;  megestrol (MEGACE ORAL) 40 MG/ML suspension, Take one teaspoon daily, Disp: 240 mL, Rfl: 1;  metoprolol succinate (TOPROL XL) 50 MG 24 hr tablet, Take 1 tablet (50 mg total) by mouth daily., Disp: 30 tablet, Rfl: 6;  montelukast (SINGULAIR) 10 MG tablet, Take 10 mg by mouth at bedtime., Disp: , Rfl:  Multiple Vitamin (MULTIVITAMIN WITH MINERALS) TABS, Take 1 tablet by mouth daily., Disp: , Rfl: ;  pantoprazole (PROTONIX) 40 MG tablet, Take 40 mg by mouth 2 (two) times daily., Disp: , Rfl: ;  promethazine (PHENERGAN) 25 MG tablet, TAKE 1/2 TABLET (12.5MG ) BY MOUTH EVERY 6 HOURS AS NEEDED FOR NAUSEA, Disp: 30 tablet, Rfl: 0;  sertraline (ZOLOFT) 50 MG tablet, daily., Disp: , Rfl:  Vitamin D, Ergocalciferol, (DRISDOL) 50000 UNITS CAPS, Take 50,000 Units by mouth every 7 (seven) days., Disp: , Rfl: ;  fluticasone (FLONASE) 50 MCG/ACT nasal spray, Place 2 sprays into the nose daily., Disp: 1 g, Rfl: 0;  simethicone (MYLICON) 80 MG chewable tablet, Chew 80 mg by mouth every 6 (six) hours as needed for flatulence., Disp: , Rfl:   BP 110/70  Pulse 102  Resp 16  Ht 5' 6.5" (1.689 m)  Wt 139 lb (63.05 kg)  BMI 22.1 kg/m2  SpO2 99%  Body mass index is 22.1 kg/(m^2).           Review of Systems     Objective:   Physical Exam blood pressure 110/70 heart rate 102 respirations  16 General well-developed well-nourished female no apparent stress alert and oriented x3 Lungs no rhonchi or wheezing Cardiovascular regular rhythm no murmurs    Assessment:     I reviewed the mesenteric angiogram and post angioplasty x-rays performed by Dr. Standley Brooking a few weeks ago.  It appears that she has gotten improvement in her SMA stenosis following angioplasty and her symptoms have improved    Plan:     Return in 6 months with CT angiogram of abdomen and pelvis to look at SMA primarily to look for recurrent stenosis

## 2012-05-13 ENCOUNTER — Other Ambulatory Visit: Payer: Self-pay

## 2012-05-13 DIAGNOSIS — K551 Chronic vascular disorders of intestine: Secondary | ICD-10-CM

## 2012-05-13 NOTE — Addendum Note (Signed)
Addended by: Adria Dill L on: 05/13/2012 11:43 AM   Modules accepted: Orders

## 2012-05-27 ENCOUNTER — Other Ambulatory Visit: Payer: Self-pay | Admitting: *Deleted

## 2012-06-03 DIAGNOSIS — H1045 Other chronic allergic conjunctivitis: Secondary | ICD-10-CM | POA: Diagnosis not present

## 2012-06-03 DIAGNOSIS — J45909 Unspecified asthma, uncomplicated: Secondary | ICD-10-CM | POA: Diagnosis not present

## 2012-06-03 DIAGNOSIS — J209 Acute bronchitis, unspecified: Secondary | ICD-10-CM | POA: Diagnosis not present

## 2012-06-03 DIAGNOSIS — J309 Allergic rhinitis, unspecified: Secondary | ICD-10-CM | POA: Diagnosis not present

## 2012-06-09 DIAGNOSIS — Z1231 Encounter for screening mammogram for malignant neoplasm of breast: Secondary | ICD-10-CM | POA: Diagnosis not present

## 2012-09-02 ENCOUNTER — Other Ambulatory Visit: Payer: Self-pay | Admitting: Cardiology

## 2012-09-02 DIAGNOSIS — E559 Vitamin D deficiency, unspecified: Secondary | ICD-10-CM | POA: Diagnosis not present

## 2012-09-02 DIAGNOSIS — Z79899 Other long term (current) drug therapy: Secondary | ICD-10-CM | POA: Diagnosis not present

## 2012-09-02 DIAGNOSIS — L659 Nonscarring hair loss, unspecified: Secondary | ICD-10-CM | POA: Diagnosis not present

## 2012-09-02 DIAGNOSIS — M810 Age-related osteoporosis without current pathological fracture: Secondary | ICD-10-CM | POA: Diagnosis not present

## 2012-10-14 DIAGNOSIS — I10 Essential (primary) hypertension: Secondary | ICD-10-CM | POA: Diagnosis not present

## 2012-10-14 DIAGNOSIS — E039 Hypothyroidism, unspecified: Secondary | ICD-10-CM | POA: Diagnosis not present

## 2012-10-14 DIAGNOSIS — E559 Vitamin D deficiency, unspecified: Secondary | ICD-10-CM | POA: Diagnosis not present

## 2012-10-14 DIAGNOSIS — E781 Pure hyperglyceridemia: Secondary | ICD-10-CM | POA: Diagnosis not present

## 2012-10-14 DIAGNOSIS — R7301 Impaired fasting glucose: Secondary | ICD-10-CM | POA: Diagnosis not present

## 2012-10-21 DIAGNOSIS — I10 Essential (primary) hypertension: Secondary | ICD-10-CM | POA: Diagnosis not present

## 2012-10-21 DIAGNOSIS — K551 Chronic vascular disorders of intestine: Secondary | ICD-10-CM | POA: Diagnosis not present

## 2012-10-21 DIAGNOSIS — E039 Hypothyroidism, unspecified: Secondary | ICD-10-CM | POA: Diagnosis not present

## 2012-10-21 DIAGNOSIS — Z0000 Encounter for general adult medical examination without abnormal findings: Secondary | ICD-10-CM | POA: Diagnosis not present

## 2012-10-21 DIAGNOSIS — Z1331 Encounter for screening for depression: Secondary | ICD-10-CM | POA: Diagnosis not present

## 2012-10-21 DIAGNOSIS — J45909 Unspecified asthma, uncomplicated: Secondary | ICD-10-CM | POA: Diagnosis not present

## 2012-10-21 DIAGNOSIS — E785 Hyperlipidemia, unspecified: Secondary | ICD-10-CM | POA: Diagnosis not present

## 2012-10-21 DIAGNOSIS — R7301 Impaired fasting glucose: Secondary | ICD-10-CM | POA: Diagnosis not present

## 2012-10-21 DIAGNOSIS — E781 Pure hyperglyceridemia: Secondary | ICD-10-CM | POA: Diagnosis not present

## 2012-10-21 DIAGNOSIS — Z23 Encounter for immunization: Secondary | ICD-10-CM | POA: Diagnosis not present

## 2012-11-13 ENCOUNTER — Other Ambulatory Visit: Payer: Self-pay | Admitting: Vascular Surgery

## 2012-11-13 DIAGNOSIS — K551 Chronic vascular disorders of intestine: Secondary | ICD-10-CM | POA: Diagnosis not present

## 2012-11-13 LAB — CREATININE, SERUM: Creat: 1.03 mg/dL (ref 0.50–1.10)

## 2012-11-13 LAB — BUN: BUN: 13 mg/dL (ref 6–23)

## 2012-11-16 ENCOUNTER — Encounter: Payer: Self-pay | Admitting: Vascular Surgery

## 2012-11-16 ENCOUNTER — Inpatient Hospital Stay (HOSPITAL_COMMUNITY): Admission: RE | Admit: 2012-11-16 | Payer: Medicare Other | Source: Ambulatory Visit

## 2012-11-17 ENCOUNTER — Inpatient Hospital Stay: Admission: RE | Admit: 2012-11-17 | Payer: Medicare Other | Source: Ambulatory Visit

## 2012-11-17 ENCOUNTER — Ambulatory Visit (INDEPENDENT_AMBULATORY_CARE_PROVIDER_SITE_OTHER): Payer: Medicare Other | Admitting: Vascular Surgery

## 2012-11-17 ENCOUNTER — Encounter: Payer: Self-pay | Admitting: Vascular Surgery

## 2012-11-17 ENCOUNTER — Ambulatory Visit
Admission: RE | Admit: 2012-11-17 | Discharge: 2012-11-17 | Disposition: A | Discharge status: Home / Self Care | Payer: Medicare Other | Source: Ambulatory Visit | Attending: Vascular Surgery | Admitting: Vascular Surgery

## 2012-11-17 VITALS — Ht 66.0 in | Wt 143.0 lb

## 2012-11-17 DIAGNOSIS — K551 Chronic vascular disorders of intestine: Secondary | ICD-10-CM | POA: Insufficient documentation

## 2012-11-17 DIAGNOSIS — I701 Atherosclerosis of renal artery: Secondary | ICD-10-CM | POA: Diagnosis not present

## 2012-11-17 MED ORDER — IOHEXOL 350 MG/ML SOLN
80.0000 mL | Freq: Once | INTRAVENOUS | Status: AC | PRN
  Administered 2012-11-17: 80 mL via INTRAVENOUS

## 2012-11-17 NOTE — Progress Notes (Signed)
Subjective:     Patient ID: Megan Foster, female   DOB: 10/04/1927, 79 y.o.   MRN: [id number]  HPI this 79 year old female returns 6 months post PTA of her SMA Dr. Standley Brooking for mesenteric ischemia. She was having postprandial pain and weight loss all of which has resolved since the procedure. He denies any abdominal pain after eating and has regained the 30 pounds that she had lost. She is having no diarrhea. She has no specific complaints today.  Past Medical History  Diagnosis Date  . Asthma   . Hypertension   . GERD (gastroesophageal reflux disease)   . Hypothyroid   . Irritable bowel syndrome   . Tachycardia   . Sinusitis   . Hyperplastic polyps of stomach   . Hiatal hernia   . Esophageal dysmotility   . Headache(784.0)   . Osteoporosis   . Elevated LFTs   . Shingles   . Vitamin D deficiency   . Depression   . Arthritis   . Hypertension   . GERD (gastroesophageal reflux disease)   . Nausea & vomiting 04/02/2012    History  Substance Use Topics  . Smoking status: Never Smoker   . Smokeless tobacco: Never Used  . Alcohol Use: No     Comment: <1 day    Family History  Problem Relation Age of Onset  . Hypertension Mother   . Heart attack Father 74  . Heart failure Sister   . Hypertension Brother   . Hypertension Brother   . Breast cancer    . Ovarian cancer    . Prostate cancer Maternal Uncle   . Stomach cancer Sister   . Colon polyps Sister     siblings  . Colon cancer Sister     Maternal Aunt x3 Maternal Uncle x 2    No Known Allergies  Current outpatient prescriptions:acetaminophen (TYLENOL) 500 MG tablet, Take 500 mg by mouth every 6 (six) hours as needed for pain., Disp: , Rfl: ;  Albuterol Sulfate (PROAIR HFA IN), Inhale 2 puffs into the lungs every 6 (six) hours as needed (for shortness of breath). , Disp: , Rfl: ;  aspirin 81 MG tablet, Take 81 mg by mouth daily., Disp: , Rfl: ;  atorvastatin (LIPITOR) 20 MG tablet, Take 20 mg by mouth daily., Disp: ,  Rfl:  CALCIUM PO, Take 1 tablet by mouth daily. , Disp: , Rfl: ;  cetirizine-pseudoephedrine (ZYRTEC-D) 5-120 MG per tablet, Take 1 tablet by mouth 2 (two) times daily., Disp: , Rfl: ;  levothyroxine (SYNTHROID, LEVOTHROID) 75 MCG tablet, Take 75 mcg by mouth daily., Disp: , Rfl: ;  loratadine (CLARITIN) 10 MG tablet, Take 10 mg by mouth daily., Disp: , Rfl:  metoprolol succinate (TOPROL-XL) 50 MG 24 hr tablet, TAKE ONE TABLET BY MOUTH ONE TIME DAILY, Disp: 30 tablet, Rfl: 5;  montelukast (SINGULAIR) 10 MG tablet, Take 10 mg by mouth at bedtime., Disp: , Rfl: ;  Multiple Vitamin (MULTIVITAMIN WITH MINERALS) TABS, Take 1 tablet by mouth daily., Disp: , Rfl: ;  pantoprazole (PROTONIX) 40 MG tablet, Take 40 mg by mouth 2 (two) times daily., Disp: , Rfl:  Vitamin D, Ergocalciferol, (DRISDOL) 50000 UNITS CAPS, Take 50,000 Units by mouth every 7 (seven) days., Disp: , Rfl: ;  alendronate (FOSAMAX) 70 MG tablet, , Disp: , Rfl: ;  budesonide-formoterol (SYMBICORT) 160-4.5 MCG/ACT inhaler, Inhale 2 puffs into the lungs 2 (two) times daily., Disp: , Rfl: ;  clopidogrel (PLAVIX) 75 MG tablet, Take 1 tablet (  75 mg total) by mouth daily., Disp: 30 tablet, Rfl: 11 dicyclomine (BENTYL) 10 MG capsule, Take 1 capsule (10 mg total) by mouth 4 (four) times daily -  before meals and at bedtime., Disp: 90 capsule, Rfl: 1;  fluticasone (FLONASE) 50 MCG/ACT nasal spray, Place 2 sprays into the nose daily., Disp: 1 g, Rfl: 0;  levocetirizine (XYZAL) 5 MG tablet, Take 5 mg by mouth every evening., Disp: , Rfl: ;  megestrol (MEGACE ORAL) 40 MG/ML suspension, Take one teaspoon daily, Disp: 240 mL, Rfl: 1 promethazine (PHENERGAN) 25 MG tablet, TAKE 1/2 TABLET (12.5MG ) BY MOUTH EVERY 6 HOURS AS NEEDED FOR NAUSEA, Disp: 30 tablet, Rfl: 0;  sertraline (ZOLOFT) 50 MG tablet, daily., Disp: , Rfl: ;  simethicone (MYLICON) 80 MG chewable tablet, Chew 80 mg by mouth every 6 (six) hours as needed for flatulence., Disp: , Rfl:   Ht 5\' 6"  (1.676  m)  Wt 143 lb (64.864 kg)  BMI 23.09 kg/m2  Body mass index is 23.09 kg/(m^2).         Review of Systems denies chest pain but does complain of swelling in legs and feet. Has a history of hives or skin rashes. All other systems negative and a complete review of    Objective:   Physical Exam Ht 5\' 6"  (1.676 m)  Wt 143 lb (64.864 kg)  BMI 23.09 kg/m2  Gen.-alert and oriented x3 in no apparent distress HEENT normal for age Lungs no rhonchi or wheezing Cardiovascular regular rhythm no murmurs carotid pulses 3+ palpable no bruits audible Abdomen soft nontender no palpable masses Musculoskeletal free of  major deformities Skin clear -no rashes Neurologic normal Lower extremities 3+ femoral and dorsalis pedis pulses palpable bilaterally with no edema  Today I ordered a CT angiogram of the abdomen and pelvis and review this by computer. There is no evidence of narrowing of the SMA or celiac axis.      Assessment:     Successful PTA of the SMA and March 2014 by Dr. Claudie Fisherman with resolution of symptoms of mesenteric ischemia    Plan:     Return in one year with duplex scan of mesenteric vessels in the office unless symptoms return-if so she will be in touch with [place] for earlier evaluation

## 2012-11-24 ENCOUNTER — Other Ambulatory Visit: Payer: Self-pay | Admitting: *Deleted

## 2013-01-29 DIAGNOSIS — H43819 Vitreous degeneration, unspecified eye: Secondary | ICD-10-CM | POA: Diagnosis not present

## 2013-01-29 DIAGNOSIS — H401490 Capsular glaucoma with pseudoexfoliation of lens, unspecified eye, stage unspecified: Secondary | ICD-10-CM | POA: Diagnosis not present

## 2013-01-29 DIAGNOSIS — H2510 Age-related nuclear cataract, unspecified eye: Secondary | ICD-10-CM | POA: Diagnosis not present

## 2013-01-29 DIAGNOSIS — H04129 Dry eye syndrome of unspecified lacrimal gland: Secondary | ICD-10-CM | POA: Diagnosis not present

## 2013-01-29 DIAGNOSIS — H40019 Open angle with borderline findings, low risk, unspecified eye: Secondary | ICD-10-CM | POA: Diagnosis not present

## 2013-02-08 DIAGNOSIS — E785 Hyperlipidemia, unspecified: Secondary | ICD-10-CM | POA: Diagnosis not present

## 2013-02-08 DIAGNOSIS — I10 Essential (primary) hypertension: Secondary | ICD-10-CM | POA: Diagnosis not present

## 2013-02-08 DIAGNOSIS — E039 Hypothyroidism, unspecified: Secondary | ICD-10-CM | POA: Diagnosis not present

## 2013-03-22 DIAGNOSIS — H1045 Other chronic allergic conjunctivitis: Secondary | ICD-10-CM | POA: Diagnosis not present

## 2013-03-22 DIAGNOSIS — J309 Allergic rhinitis, unspecified: Secondary | ICD-10-CM | POA: Diagnosis not present

## 2013-03-22 DIAGNOSIS — J45909 Unspecified asthma, uncomplicated: Secondary | ICD-10-CM | POA: Diagnosis not present

## 2013-05-14 DIAGNOSIS — R1031 Right lower quadrant pain: Secondary | ICD-10-CM | POA: Diagnosis not present

## 2013-05-18 ENCOUNTER — Other Ambulatory Visit: Payer: Self-pay | Admitting: Internal Medicine

## 2013-05-18 DIAGNOSIS — I10 Essential (primary) hypertension: Secondary | ICD-10-CM | POA: Diagnosis not present

## 2013-05-18 DIAGNOSIS — M25551 Pain in right hip: Secondary | ICD-10-CM

## 2013-05-18 DIAGNOSIS — IMO0002 Reserved for concepts with insufficient information to code with codable children: Secondary | ICD-10-CM | POA: Diagnosis not present

## 2013-05-18 DIAGNOSIS — R109 Unspecified abdominal pain: Secondary | ICD-10-CM | POA: Diagnosis not present

## 2013-05-18 DIAGNOSIS — K551 Chronic vascular disorders of intestine: Secondary | ICD-10-CM

## 2013-05-18 DIAGNOSIS — M810 Age-related osteoporosis without current pathological fracture: Secondary | ICD-10-CM | POA: Diagnosis not present

## 2013-05-18 DIAGNOSIS — I771 Stricture of artery: Secondary | ICD-10-CM

## 2013-05-19 ENCOUNTER — Ambulatory Visit
Admission: RE | Admit: 2013-05-19 | Discharge: 2013-05-19 | Disposition: A | Discharge status: Home / Self Care | Payer: Medicare Other | Source: Ambulatory Visit | Attending: Internal Medicine | Admitting: Internal Medicine

## 2013-05-19 DIAGNOSIS — I771 Stricture of artery: Secondary | ICD-10-CM

## 2013-05-19 DIAGNOSIS — I70209 Unspecified atherosclerosis of native arteries of extremities, unspecified extremity: Secondary | ICD-10-CM | POA: Diagnosis not present

## 2013-05-19 DIAGNOSIS — M25551 Pain in right hip: Secondary | ICD-10-CM

## 2013-05-19 DIAGNOSIS — K551 Chronic vascular disorders of intestine: Secondary | ICD-10-CM

## 2013-05-19 MED ORDER — IOHEXOL 350 MG/ML SOLN
75.0000 mL | Freq: Once | INTRAVENOUS | Status: AC | PRN
  Administered 2013-05-19: 75 mL via INTRAVENOUS

## 2013-05-26 ENCOUNTER — Encounter: Payer: Self-pay | Admitting: *Deleted

## 2013-05-28 ENCOUNTER — Encounter: Payer: Self-pay | Admitting: Gastroenterology

## 2013-05-28 ENCOUNTER — Ambulatory Visit (INDEPENDENT_AMBULATORY_CARE_PROVIDER_SITE_OTHER): Payer: Medicare Other | Admitting: Gastroenterology

## 2013-05-28 VITALS — BP 118/70 | HR 80 | Ht 66.0 in | Wt 147.2 lb

## 2013-05-28 DIAGNOSIS — R1031 Right lower quadrant pain: Secondary | ICD-10-CM | POA: Diagnosis not present

## 2013-05-28 DIAGNOSIS — N949 Unspecified condition associated with female genital organs and menstrual cycle: Secondary | ICD-10-CM

## 2013-05-28 DIAGNOSIS — R102 Pelvic and perineal pain: Secondary | ICD-10-CM

## 2013-05-28 NOTE — Patient Instructions (Signed)
Please call our office back after your orthopedic appointment and give Rollene Fare an update on your condition

## 2013-05-28 NOTE — Progress Notes (Signed)
Reviewed and agree with awaiting orthopedic impression.

## 2013-05-28 NOTE — Progress Notes (Signed)
05/28/2013 Megan Foster [id number] 01/26/1928   History of Present Illness:  This is a pleasant 80 year old female who is known to Dr. Olevia Perches.  The patient presents to our office today with her daughter regarding complaints of right sided abdominal/pelvic/groin pain.  The patient reports that this pain began on March 31 while she was visiting in [place]. She denies any other associated GI complaints. The pain is not affected by eating or bowel movements. It is somewhat aggravated by movement. She went to an urgent care in [place] on April 3 and a noncontrast CT scan of the abdomen and pelvis showed a moderate amount of gas within the small and large bowel, but no bowel obstruction. It also showed a small hiatal hernia, 2 rounded structures that were slightly dense and consistent with partially calcified lymph nodes in the region of the ascending colon, and a small 3 mm density in the dependent portion of the gallbladder that was consistent with a calculus. CBC and CMP were within normal limits. Urinalysis was also negative. The pain had actually resolved by the time she got to the urgent care, but then began again later that day. She returned to [facility] on April 5 and got an appointment at their PCPs office on April 7. PCP ordered another urine study, which was again negative. They also ordered a CT angio of the abdomen and pelvis which revealed no significant findings and no evidence of significant SMA restenosis after prior angioplasty. According to the PCP's note, it did not sound convincing for GI or GU pathology, but they sent her here for further evaluation. She is also scheduled for an orthopedic appointment next week as well.  Just of note, her last colonoscopy was in January 2008 at which time the study was normal.    Current Medications, Allergies, Past Medical History, Past Surgical History, Family History and Social History were reviewed in [employer]  record.   Physical Exam: BP 118/70  Pulse 80  Ht 5\' 6"  (1.676 m)  Wt 147 lb 3.2 oz (66.769 kg)  BMI 23.77 kg/m2 General:  Elderly white female in no acute distress Head: Normocephalic and atraumatic Eyes:  Sclerae anicteric, conjunctiva pink  Ears: Normal auditory acuity Lungs: Clear throughout to auscultation Heart: Regular rate and rhythm Abdomen: Soft, non-distended.  Normal bowel sounds.  Minimal right sided TTP in the pelvic region. Musculoskeletal: Symmetrical with no gross deformities  Extremities: No edema  Neurological: Alert oriented x 4, grossly non-focal Psychological:  Alert and cooperative. Normal mood and affect  Assessment and Recommendations: -Right groin /pelvic pain that radiates into her right thigh. CT scan without any impressive abdominal findings. Unsure if this is GI in origin with absence of any other GI symptoms. She does have an appointment with an orthopedist for evaluation next week as well. She will keep that appointment and call our office with an update regarding their opinion. Will decide on any further evaluation GI evaluation from there.

## 2013-06-02 ENCOUNTER — Telehealth: Payer: Self-pay | Admitting: Gastroenterology

## 2013-06-02 DIAGNOSIS — M5137 Other intervertebral disc degeneration, lumbosacral region: Secondary | ICD-10-CM | POA: Diagnosis not present

## 2013-06-02 DIAGNOSIS — M5430 Sciatica, unspecified side: Secondary | ICD-10-CM | POA: Diagnosis not present

## 2013-06-02 NOTE — Telephone Encounter (Signed)
Awesome! Thank you!! I didn't think it was GI related.

## 2013-06-02 NOTE — Telephone Encounter (Signed)
Patient calling to let Janett Billow know she saw her orthopedic MD today. He thinks her pain is from degenerative arthritis.

## 2013-06-10 DIAGNOSIS — Z1231 Encounter for screening mammogram for malignant neoplasm of breast: Secondary | ICD-10-CM | POA: Diagnosis not present

## 2013-07-16 DIAGNOSIS — H268 Other specified cataract: Secondary | ICD-10-CM | POA: Diagnosis not present

## 2013-07-16 DIAGNOSIS — H40019 Open angle with borderline findings, low risk, unspecified eye: Secondary | ICD-10-CM | POA: Diagnosis not present

## 2013-07-16 DIAGNOSIS — H2510 Age-related nuclear cataract, unspecified eye: Secondary | ICD-10-CM | POA: Diagnosis not present

## 2013-07-16 DIAGNOSIS — H04129 Dry eye syndrome of unspecified lacrimal gland: Secondary | ICD-10-CM | POA: Diagnosis not present

## 2013-07-26 DIAGNOSIS — H268 Other specified cataract: Secondary | ICD-10-CM | POA: Diagnosis not present

## 2013-07-26 DIAGNOSIS — H2510 Age-related nuclear cataract, unspecified eye: Secondary | ICD-10-CM | POA: Diagnosis not present

## 2013-11-16 DIAGNOSIS — J4530 Mild persistent asthma, uncomplicated: Secondary | ICD-10-CM | POA: Diagnosis not present

## 2013-11-16 DIAGNOSIS — J3089 Other allergic rhinitis: Secondary | ICD-10-CM | POA: Diagnosis not present

## 2013-11-16 DIAGNOSIS — H1045 Other chronic allergic conjunctivitis: Secondary | ICD-10-CM | POA: Diagnosis not present

## 2013-11-19 DIAGNOSIS — E039 Hypothyroidism, unspecified: Secondary | ICD-10-CM | POA: Diagnosis not present

## 2013-11-19 DIAGNOSIS — E559 Vitamin D deficiency, unspecified: Secondary | ICD-10-CM | POA: Diagnosis not present

## 2013-11-19 DIAGNOSIS — Z79899 Other long term (current) drug therapy: Secondary | ICD-10-CM | POA: Diagnosis not present

## 2013-11-19 DIAGNOSIS — E785 Hyperlipidemia, unspecified: Secondary | ICD-10-CM | POA: Diagnosis not present

## 2013-11-19 DIAGNOSIS — I10 Essential (primary) hypertension: Secondary | ICD-10-CM | POA: Diagnosis not present

## 2013-11-19 DIAGNOSIS — R7301 Impaired fasting glucose: Secondary | ICD-10-CM | POA: Diagnosis not present

## 2013-11-22 ENCOUNTER — Encounter: Payer: Self-pay | Admitting: Vascular Surgery

## 2013-11-23 ENCOUNTER — Ambulatory Visit (INDEPENDENT_AMBULATORY_CARE_PROVIDER_SITE_OTHER): Payer: Medicare Other | Admitting: Vascular Surgery

## 2013-11-23 ENCOUNTER — Encounter: Payer: Self-pay | Admitting: Vascular Surgery

## 2013-11-23 ENCOUNTER — Ambulatory Visit (HOSPITAL_COMMUNITY)
Admission: RE | Admit: 2013-11-23 | Discharge: 2013-11-23 | Disposition: A | Discharge status: Home / Self Care | Payer: Medicare Other | Source: Ambulatory Visit | Attending: Vascular Surgery | Admitting: Vascular Surgery

## 2013-11-23 VITALS — BP 150/77 | HR 77 | Ht 66.0 in | Wt 150.2 lb

## 2013-11-23 DIAGNOSIS — K551 Chronic vascular disorders of intestine: Secondary | ICD-10-CM

## 2013-11-23 DIAGNOSIS — I771 Stricture of artery: Secondary | ICD-10-CM | POA: Diagnosis not present

## 2013-11-23 DIAGNOSIS — Z48812 Encounter for surgical aftercare following surgery on the circulatory system: Secondary | ICD-10-CM

## 2013-11-23 NOTE — Patient Instructions (Signed)
Chronic Mesenteric Ischemia Mesenteric ischemia is a deficiency of blood in an area of the intestine supplied by an artery that supports the intestine. Chronic mesenteric ischemia, also called intestinal angina, is a long-term condition. It happens when an artery or vein that supports the intestine gradually becomes blocked or narrow, restricting the blood supply to the intestine. When the blood supply to the intestine is severely restricted, the intestines cannot function properly because needed oxygen cannot reach them.  CAUSES   Fatty deposits that build up in an artery or vein but have not yet restricted blood flow entirely.  Differences in some people's anatomy.  Rapid weight loss.  Weakened areas in blood vessel walls (aneurysms).  Swelling and inflammation of blood vessels (such as from fibromuscular dysplasia and arteritis).  Disorders of blood clotting.  Scarring and fibrosis of blood vessels after radiation therapy.  Blood vessel problems after drug use, such as use of cocaine. RISK FACTORS  Being female.  Being over age 67 with a history of coronary or vascular disease.  Smoking.  Congestive heart failure.  Diabetes.  High cholesterol.  High blood pressure (hypertension). SIGNS AND SYMPTOMS   Severe stomachache. Some people become fearful of eating because of pain.   Abdominal pain or cramps that develop about 30 minutes after a meal.   Abdominal pain after eating that becomes worse over time.   Diarrhea.   Nausea.   Vomiting.   Bloating.   Weight loss. DIAGNOSIS  Chronic mesenteric ischemia is often diagnosed after the person's history is taken, a physical exam is done, and tests are taken. Tests may include:  Ultrasounds.  CT scans.  Angiography. This is an imaging test that uses a dye to obtain a picture of blood flow to the intestine.  Endoscopy. This involves putting a scope through the mouth, down the throat, and into the stomach and  intestine to view the intestinal wall and take small tissue samples (biopsies).  Tonometry. In this test a tiny probe is passed through the mouth and into the stomach or intestine and left in place for 24 hours or more. It measures the output of carbon dioxide by the affected tissues. TREATMENT  Treatment may include:   Medicines to reduce blood clotting and increase blood flow.   Surgery to remove the blockage, repair arteries or veins, and restore blood flow. This may involve:   Angioplasty. This is surgery to widen the affected artery, reduce the blockage, and sometimes insert a small, mesh tube (stent).   Bypass surgery. This may be performed to bypass the blockage and reconnect healthy arteries or veins.   A stent in the affected area to help keep blocked arteries open. HOME CARE INSTRUCTIONS  Only take over-the-counter or prescription medicines as directed by your health care provider.   Keep all follow-up appointments as directed by your health care provider.   Prevent the condition from occurring by:  Doing regular exercise.  Keeping a healthy weight.  Keeping a healthy diet.  Managing cholesterol levels.  Keeping blood pressure and heart rhythm problems under control.  Not smoking. SEEK IMMEDIATE MEDICAL CARE IF:  You have severe abdominal pain.   You notice blood in your stool.   You have nausea, vomiting, or diarrhea.   You have a fever. MAKE SURE YOU:  Understand these instructions.  Will watch your condition.  Will get help right away if you are not doing well or get worse. Document Released: 09/17/2010 Document Revised: 09/30/2012 Document Reviewed: 07/29/2012 [employer]  Patient Information 2015 [employer]. This information is not intended to replace advice given to you by your health care provider. Make sure you discuss any questions you have with your health care provider.  

## 2013-11-23 NOTE — Progress Notes (Signed)
Subjective:     Patient ID: Megan Foster, female   DOB: 01-12-1928, 80 y.o.   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 This was treated by PTA of the SMA by Dr. Vallarie Mare 04/16/2012. Patient had lost 30 pounds and was having classic mesenteric ischemia symptoms with postprandial pain. The symptoms have been completely relieved and she returned to her normal white of 150 pounds. Her bowel movements are normal and she has no abdominal discomfort.  Past Medical History  Diagnosis Date  . Asthma   . Hypertension   . GERD (gastroesophageal reflux disease)   . Hypothyroid   . Irritable bowel syndrome   . Tachycardia   . Sinusitis   . Hyperplastic polyps of stomach   . Hiatal hernia   . Esophageal dysmotility   . Headache(784.0)   . Osteoporosis   . Elevated LFTs   . Shingles   . Vitamin D deficiency   . Depression   . Arthritis   . Nausea & vomiting 04/02/2012    History  Substance Use Topics  . Smoking status: Never Smoker   . Smokeless tobacco: Never Used  . Alcohol Use: No     Comment: <1 day    Family History  Problem Relation Age of Onset  . Hypertension Mother   . Heart attack Mother   . Heart attack Father 81  . Hypertension Father   . Peripheral vascular disease Father   . Heart failure Sister   . Cancer Sister   . Hypertension Brother   . Cancer Brother   . Hypertension Brother   . Breast cancer Sister   . Ovarian cancer      mat cousin  . Prostate cancer Maternal Uncle   . Colon polyps Sister     siblings  . Colon cancer      Maternal Aunt x3 Maternal Uncle x 2  . Esophageal cancer Brother     died at 45  . Kidney disease Brother     No Known Allergies  Current outpatient prescriptions:acetaminophen (TYLENOL) 500 MG tablet, Take 500 mg by mouth every 6 (six) hours as needed for pain., Disp: , Rfl: ;  Albuterol Sulfate (PROAIR HFA IN), Inhale 2 puffs  into the lungs every 6 (six) hours as needed (for shortness of breath). , Disp: , Rfl: ;  aspirin 81 MG tablet, Take 81 mg by mouth daily., Disp: , Rfl: ;  atorvastatin (LIPITOR) 20 MG tablet, Take 20 mg by mouth daily., Disp: , Rfl:  Budesonide-Formoterol Fumarate (SYMBICORT IN), Inhale 2 puffs into the lungs 2 (two) times daily., Disp: , Rfl: ;  cetirizine-pseudoephedrine (ZYRTEC-D) 5-120 MG per tablet, Take 1 tablet by mouth daily. , Disp: , Rfl: ;  levothyroxine (SYNTHROID, LEVOTHROID) 88 MCG tablet, Take 88 mcg by mouth daily before breakfast., Disp: , Rfl:  metoprolol succinate (TOPROL-XL) 50 MG 24 hr tablet, TAKE ONE TABLET BY MOUTH ONE TIME DAILY, Disp: 30 tablet, Rfl: 5;  montelukast (SINGULAIR) 10 MG tablet, Take 10 mg by mouth at bedtime., Disp: , Rfl: ;  Multiple Vitamin (MULTIVITAMIN WITH MINERALS) TABS, Take 1 tablet by mouth daily., Disp: , Rfl: ;  pantoprazole (PROTONIX) 40 MG tablet, Take 40 mg by mouth 2 (two) times daily., Disp: , Rfl:  Vitamin D, Ergocalciferol, (DRISDOL) 50000 UNITS CAPS, Take 50,000 Units by mouth every 7 (seven) days., Disp: , Rfl: ;  fluticasone (FLONASE) 50 MCG/ACT nasal spray,  Place 2 sprays into the nose daily., Disp: 1 g, Rfl: 0;  traMADol (ULTRAM) 50 MG tablet, , Disp: , Rfl:   BP 150/77  Pulse 77  Ht 5\' 6"  (1.676 m)  Wt 150 lb 3.2 oz (68.13 kg)  BMI 24.25 kg/m2  SpO2 98%  Body mass index is 24.25 kg/(m^2).           Review of Systems denies chest pain, dyspnea on exertion, PND, orthopnea, hemoptysis, lateralizing weakness, any aphasia, amaurosis fugax, claudication. Does have symptoms of asthma occasional wheezing. Other systems negative the complete review of systems     Objective:   Physical Exam BP 150/77  Pulse 77  Ht 5\' 6"  (1.676 m)  Wt 150 lb 3.2 oz (68.13 kg)  BMI 24.25 kg/m2  SpO2 98%  Gen.-alert and oriented x3 in no apparent distress HEENT normal for age Lungs no rhonchi or wheezing Cardiovascular regular rhythm no murmurs  carotid pulses 3+ palpable no bruits audible Abdomen soft nontender no palpable masses Musculoskeletal free of  major deformities Skin clear -no rashes Neurologic normal Lower extremities 3+ femoral and dorsalis pedis pulses palpable bilaterally with no edema  Today I ordered a mesenteric duplex exam which I reviewed and interpreted. There is no evidence of significant stenosis at the origin of the SMA.        Assessment:     Doing well 18 months post treatment of SMA stenosis 4 chronic mesenteric ischemia withPTA    Plan:     Return in one year with repeat mesenteric duplex exam unless patient develops recurrent symptoms of mesenteric ischemia including weight loss or postprandial pain which she would recognize Continue daily aspirin

## 2013-11-25 DIAGNOSIS — K551 Chronic vascular disorders of intestine: Secondary | ICD-10-CM | POA: Diagnosis not present

## 2013-11-25 DIAGNOSIS — Z0000 Encounter for general adult medical examination without abnormal findings: Secondary | ICD-10-CM | POA: Diagnosis not present

## 2013-11-25 DIAGNOSIS — I10 Essential (primary) hypertension: Secondary | ICD-10-CM | POA: Diagnosis not present

## 2013-11-25 DIAGNOSIS — Z1389 Encounter for screening for other disorder: Secondary | ICD-10-CM | POA: Diagnosis not present

## 2013-11-25 DIAGNOSIS — E785 Hyperlipidemia, unspecified: Secondary | ICD-10-CM | POA: Diagnosis not present

## 2013-11-25 DIAGNOSIS — R7301 Impaired fasting glucose: Secondary | ICD-10-CM | POA: Diagnosis not present

## 2013-11-25 DIAGNOSIS — E039 Hypothyroidism, unspecified: Secondary | ICD-10-CM | POA: Diagnosis not present

## 2013-11-25 DIAGNOSIS — Z23 Encounter for immunization: Secondary | ICD-10-CM | POA: Diagnosis not present

## 2013-11-25 DIAGNOSIS — N183 Chronic kidney disease, stage 3 (moderate): Secondary | ICD-10-CM | POA: Diagnosis not present

## 2013-11-25 DIAGNOSIS — E781 Pure hyperglyceridemia: Secondary | ICD-10-CM | POA: Diagnosis not present

## 2014-02-16 DIAGNOSIS — M859 Disorder of bone density and structure, unspecified: Secondary | ICD-10-CM | POA: Diagnosis not present

## 2014-03-17 DIAGNOSIS — Z961 Presence of intraocular lens: Secondary | ICD-10-CM | POA: Diagnosis not present

## 2014-03-17 DIAGNOSIS — H2589 Other age-related cataract: Secondary | ICD-10-CM | POA: Diagnosis not present

## 2014-03-17 DIAGNOSIS — H40013 Open angle with borderline findings, low risk, bilateral: Secondary | ICD-10-CM | POA: Diagnosis not present

## 2014-05-30 DIAGNOSIS — M810 Age-related osteoporosis without current pathological fracture: Secondary | ICD-10-CM | POA: Diagnosis not present

## 2014-05-30 DIAGNOSIS — E039 Hypothyroidism, unspecified: Secondary | ICD-10-CM | POA: Diagnosis not present

## 2014-05-30 DIAGNOSIS — I10 Essential (primary) hypertension: Secondary | ICD-10-CM | POA: Diagnosis not present

## 2014-05-30 DIAGNOSIS — E781 Pure hyperglyceridemia: Secondary | ICD-10-CM | POA: Diagnosis not present

## 2014-05-31 IMAGING — CT CT CTA ABD/PEL W/CM AND/OR W/O CM
3 of 9 series · 12 of 46 positions shown, 18 images · IV contrast (Omnipaque 300)
Comparison: None.

CLINICAL DATA: Postprandial pain and weight loss

CT ANGIOGRAPHY ABDOMEN AND PELVIS
TECHNIQUE: Multidetector CT imaging of the abdomen and pelvis was
performed using the standard protocol during bolus administration
of intravenous contrast.  Multiplanar reconstructed images
including MIPs were obtained and reviewed to evaluate the vascular
anatomy.
Contrast: 100mL OMNIPAQUE IOHEXOL 350 MG/ML SOLN

[Series 5: cta mesenteric arterial · axial · arterial · 0.70mm/px · z∈[-446,-394]mm · 2 of 190 slices shown]
[im 22/190  soft-tissue]
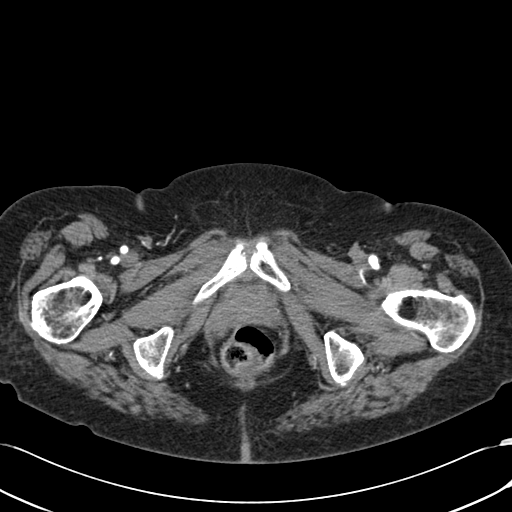
[im 43/190  soft-tissue]
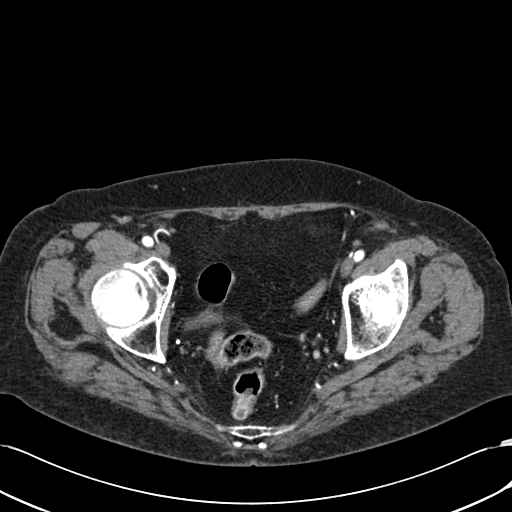

[Series 6: cta mesenteric portal-venous · axial · portal-venous · 0.69mm/px · z∈[-447,-82]mm · 8 of 95 slices shown, 13 images]
[im 11/95  soft-tissue]
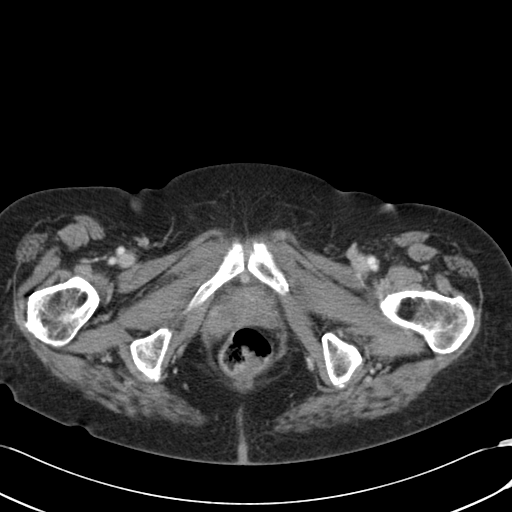
[im 11/95  bone]
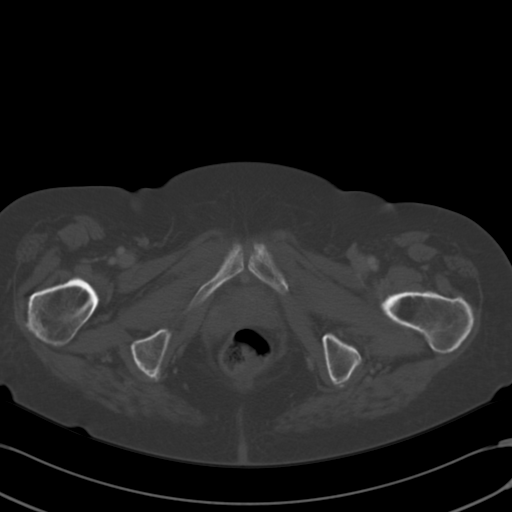
[im 21/95  soft-tissue]
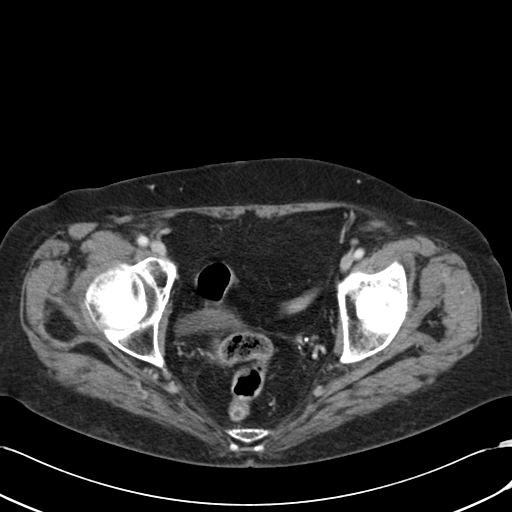
[im 32/95  soft-tissue]
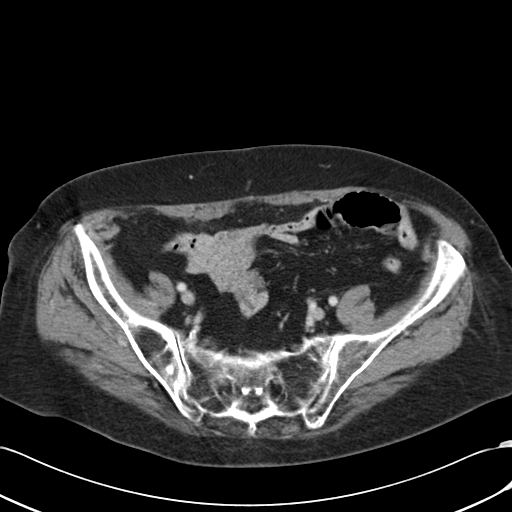
[im 42/95  soft-tissue]
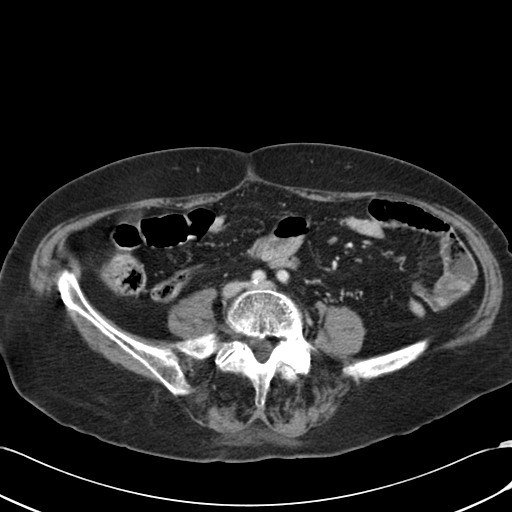
[im 53/95  soft-tissue]
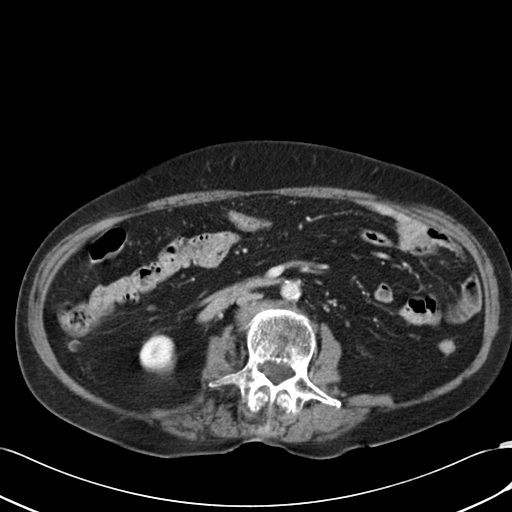
[im 53/95  lung]
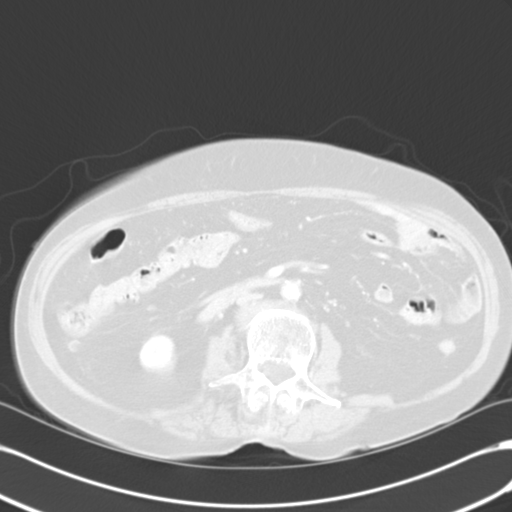
[im 63/95  soft-tissue]
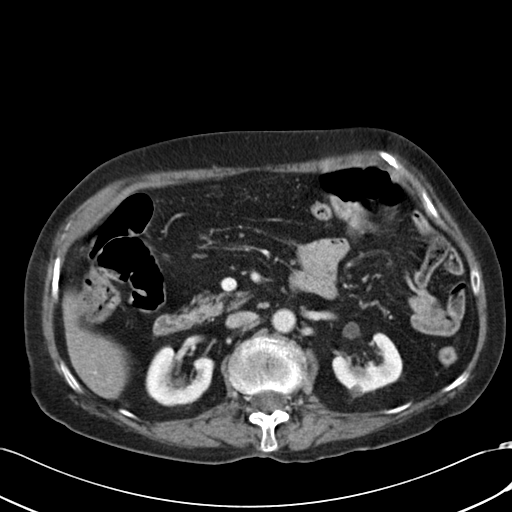
[im 63/95  lung]
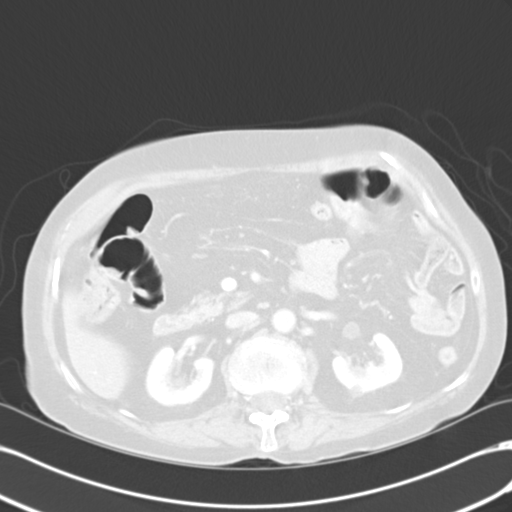
[im 74/95  soft-tissue]
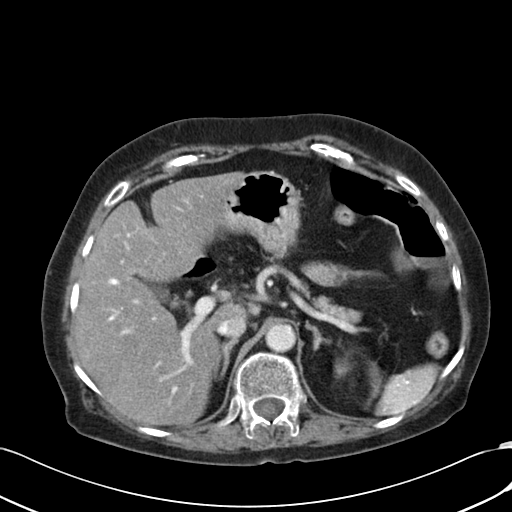
[im 74/95  lung]
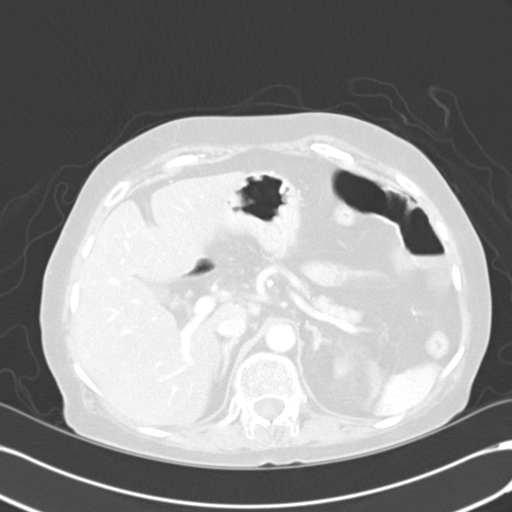
[im 84/95  soft-tissue]
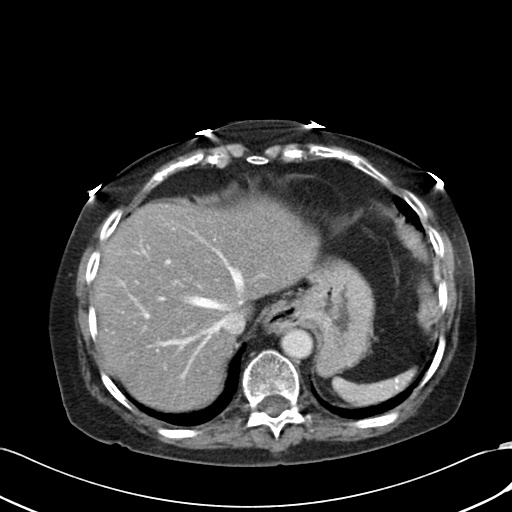
[im 84/95  lung]
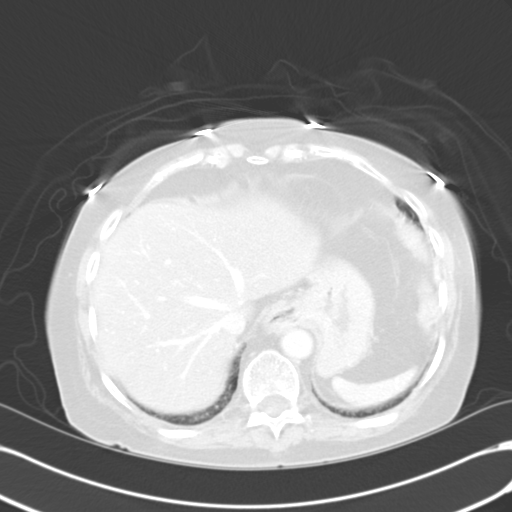

[Series 602: cor mpr · coronal · 0.94mm/px · 2 of 106 slices shown, 3 images]
[im 36/106  soft-tissue]
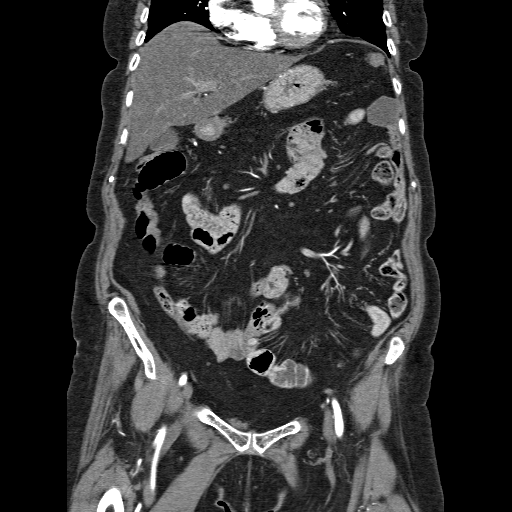
[im 36/106  bone]
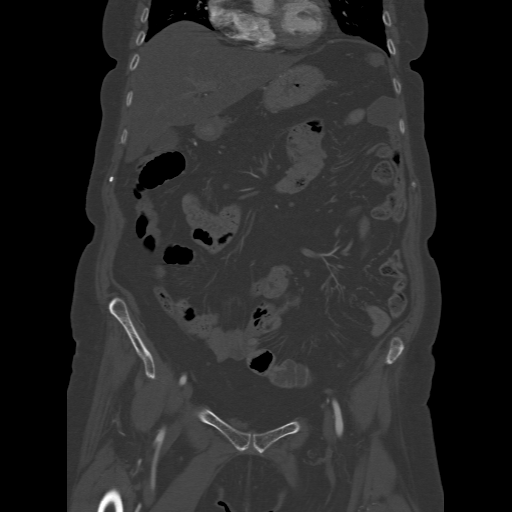
[im 71/106  soft-tissue]
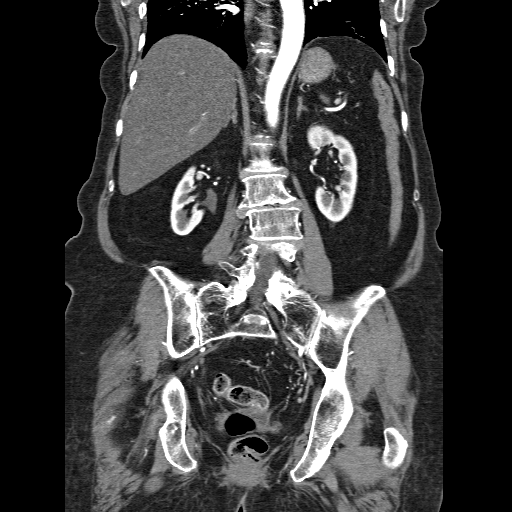

[12 of 46 positions shown; findings below may reference images not displayed]

FINDINGS: Atherosclerotic changes are present at the origin of the
celiac with 50% narrowing.  Minimal post stenotic dilatation is
suspected.  Branch vessels are patent.

There is severe focal stenosis of the SMA, 1 cm beyond its takeoff.
Branch vessels are grossly patent.

IMA is diminutive and patent with atherosclerotic changes at its
origin.

Aorta is nonaneurysmal and patent with mild scattered plaque.  The
right renal artery is diminutive and patent.  Left renal arteries
also diminutive and there is 50-60% narrowing at its origin.

Bilateral common and external iliac arteries are mildly tortuous
and patent.  Internal iliac arteries are patent.

Diffuse hepatic steatosis.  Calcified granuloma in the left lobe.

Small gallstones versus polyp is suspected.

Innumerable calcified granulomata are present in the spleen.
Pancreas, adrenal glands within normal limits.

Small simple cyst in the left kidney.  Right kidney is
unremarkable.

Bowel is decompressed.  There are small curvilinear soft tissue
densities adjacent to the ascending colon which may represent a non
pathological lymph nodes with fatty hilum.

Advanced degenerative change in the lower lumbar spine, and L4-5
and L5-S1.

No destructive bone lesion.

No free fluid.

Small hiatal hernia.

 Review of the MIP images confirms the above findings.
IMPRESSION: The study is positive for significant narrowing in the proximal
SMA.  Endovascular treatment may provide relief of symptoms.

There is also some degree of narrowing at the origin of the celiac.
IMA is patent.

Significant narrowing at the origin of the left renal artery.
Right renal arteries diminutive and patent.

Atherosclerotic plaque and changes in the aorta without aneurysmal
dilatation.

Small gallstone versus polyp.  Ultrasound may be helpful to further
characterize.

Hiatal hernia.

Small soft tissue densities adjacent to the ascending colon likely
representing a normal lymph nodes.

Simple cyst in the left kidney.

## 2014-06-01 DIAGNOSIS — J4530 Mild persistent asthma, uncomplicated: Secondary | ICD-10-CM | POA: Diagnosis not present

## 2014-06-01 DIAGNOSIS — J3089 Other allergic rhinitis: Secondary | ICD-10-CM | POA: Diagnosis not present

## 2014-06-01 DIAGNOSIS — H1045 Other chronic allergic conjunctivitis: Secondary | ICD-10-CM | POA: Diagnosis not present

## 2014-08-04 DIAGNOSIS — Z1231 Encounter for screening mammogram for malignant neoplasm of breast: Secondary | ICD-10-CM | POA: Diagnosis not present

## 2014-08-19 DIAGNOSIS — R928 Other abnormal and inconclusive findings on diagnostic imaging of breast: Secondary | ICD-10-CM | POA: Diagnosis not present

## 2014-08-19 DIAGNOSIS — Z803 Family history of malignant neoplasm of breast: Secondary | ICD-10-CM | POA: Diagnosis not present

## 2014-09-22 DIAGNOSIS — Z961 Presence of intraocular lens: Secondary | ICD-10-CM | POA: Diagnosis not present

## 2014-09-22 DIAGNOSIS — H2512 Age-related nuclear cataract, left eye: Secondary | ICD-10-CM | POA: Diagnosis not present

## 2014-11-15 DIAGNOSIS — H2512 Age-related nuclear cataract, left eye: Secondary | ICD-10-CM | POA: Diagnosis not present

## 2014-11-21 DIAGNOSIS — H2512 Age-related nuclear cataract, left eye: Secondary | ICD-10-CM | POA: Diagnosis not present

## 2014-11-29 ENCOUNTER — Other Ambulatory Visit (HOSPITAL_COMMUNITY): Payer: Medicare Other

## 2014-11-29 ENCOUNTER — Ambulatory Visit: Payer: Medicare Other | Admitting: Vascular Surgery

## 2014-11-30 DIAGNOSIS — R7301 Impaired fasting glucose: Secondary | ICD-10-CM | POA: Diagnosis not present

## 2014-11-30 DIAGNOSIS — E039 Hypothyroidism, unspecified: Secondary | ICD-10-CM | POA: Diagnosis not present

## 2014-11-30 DIAGNOSIS — E785 Hyperlipidemia, unspecified: Secondary | ICD-10-CM | POA: Diagnosis not present

## 2014-11-30 DIAGNOSIS — M810 Age-related osteoporosis without current pathological fracture: Secondary | ICD-10-CM | POA: Diagnosis not present

## 2014-11-30 DIAGNOSIS — I10 Essential (primary) hypertension: Secondary | ICD-10-CM | POA: Diagnosis not present

## 2014-12-02 ENCOUNTER — Encounter: Payer: Self-pay | Admitting: Vascular Surgery

## 2014-12-06 ENCOUNTER — Other Ambulatory Visit (HOSPITAL_COMMUNITY): Payer: Medicare Other

## 2014-12-06 ENCOUNTER — Ambulatory Visit (INDEPENDENT_AMBULATORY_CARE_PROVIDER_SITE_OTHER): Payer: Medicare Other | Admitting: Family

## 2014-12-06 ENCOUNTER — Ambulatory Visit (HOSPITAL_COMMUNITY)
Admission: RE | Admit: 2014-12-06 | Discharge: 2014-12-06 | Disposition: A | Discharge status: Home / Self Care | Payer: Medicare Other | Source: Ambulatory Visit | Attending: Family | Admitting: Family

## 2014-12-06 ENCOUNTER — Ambulatory Visit: Payer: Medicare Other | Admitting: Vascular Surgery

## 2014-12-06 ENCOUNTER — Encounter: Payer: Self-pay | Admitting: Family

## 2014-12-06 VITALS — BP 129/84 | HR 85 | Temp 96.9°F | Resp 14 | Ht 66.0 in | Wt 144.6 lb

## 2014-12-06 DIAGNOSIS — Z48812 Encounter for surgical aftercare following surgery on the circulatory system: Secondary | ICD-10-CM | POA: Diagnosis not present

## 2014-12-06 DIAGNOSIS — E781 Pure hyperglyceridemia: Secondary | ICD-10-CM | POA: Diagnosis not present

## 2014-12-06 DIAGNOSIS — Z9862 Peripheral vascular angioplasty status: Secondary | ICD-10-CM

## 2014-12-06 DIAGNOSIS — Z1389 Encounter for screening for other disorder: Secondary | ICD-10-CM | POA: Diagnosis not present

## 2014-12-06 DIAGNOSIS — I10 Essential (primary) hypertension: Secondary | ICD-10-CM | POA: Diagnosis not present

## 2014-12-06 DIAGNOSIS — K551 Chronic vascular disorders of intestine: Secondary | ICD-10-CM | POA: Diagnosis not present

## 2014-12-06 DIAGNOSIS — E559 Vitamin D deficiency, unspecified: Secondary | ICD-10-CM | POA: Diagnosis not present

## 2014-12-06 DIAGNOSIS — J45909 Unspecified asthma, uncomplicated: Secondary | ICD-10-CM | POA: Diagnosis not present

## 2014-12-06 DIAGNOSIS — Z6823 Body mass index (BMI) 23.0-23.9, adult: Secondary | ICD-10-CM | POA: Diagnosis not present

## 2014-12-06 DIAGNOSIS — Z0000 Encounter for general adult medical examination without abnormal findings: Secondary | ICD-10-CM | POA: Diagnosis not present

## 2014-12-06 DIAGNOSIS — Z23 Encounter for immunization: Secondary | ICD-10-CM | POA: Diagnosis not present

## 2014-12-06 DIAGNOSIS — E785 Hyperlipidemia, unspecified: Secondary | ICD-10-CM | POA: Diagnosis not present

## 2014-12-06 DIAGNOSIS — E039 Hypothyroidism, unspecified: Secondary | ICD-10-CM | POA: Diagnosis not present

## 2014-12-06 DIAGNOSIS — M810 Age-related osteoporosis without current pathological fracture: Secondary | ICD-10-CM | POA: Diagnosis not present

## 2014-12-06 NOTE — Addendum Note (Signed)
Addended by: Mena Goes on: 12/06/2014 05:37 PM   Modules accepted: Orders

## 2014-12-06 NOTE — Progress Notes (Signed)
Established Mesenteric Ischemia  History of Present Illness  Megan Foster is a 81 y.o. ([birth year]) female patient of Dr. 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 This was treated by PTA of the SMA by Dr. Bridgett Larsson 04/16/2012. Patient had lost 30 pounds and was having classic mesenteric ischemia symptoms with postprandial pain. The symptoms have been completely relieved and she returned to her normal white of 150 pounds. Her bowel movements are normal and she has no abdominal discomfort. Pt lost 6 pounds since seen a year ago; pt states this is her normal weight. Pt and her daughter state this weight loss is likely due to problems she is having with her partial dental plates, is working with a Pharmacist, community re this. She denies post prandial abdominal pain, denies food fear. She is drinking an Ensure daily. Pt denies any history of stroke or TIA.  She has never used tobacco, does not have DM. She takes a statin, ASA; she takes no anticoagulants.    Past Medical History  Diagnosis Date  . Asthma   . Hypertension   . GERD (gastroesophageal reflux disease)   . Hypothyroid   . Irritable bowel syndrome   . Tachycardia   . Sinusitis   . Hyperplastic polyps of stomach   . Hiatal hernia   . Esophageal dysmotility   . Headache(784.0)   . Osteoporosis   . Elevated LFTs   . Shingles   . Vitamin D deficiency   . Depression   . Arthritis   . Nausea & vomiting 04/02/2012    Social History Social History  Substance Use Topics  . Smoking status: Never Smoker   . Smokeless tobacco: Never Used  . Alcohol Use: No     Comment: <1 day    Family History Family History  Problem Relation Age of Onset  . Hypertension Mother   . Heart attack Mother   . Heart attack Father 61  . Hypertension Father   . Peripheral vascular disease Father   . Heart failure Sister   . Cancer Sister   . Hypertension Brother   . Cancer Brother   .  Hypertension Brother   . Breast cancer Sister   . Ovarian cancer      mat cousin  . Prostate cancer Maternal Uncle   . Colon polyps Sister     siblings  . Colon cancer      Maternal Aunt x3 Maternal Uncle x 2  . Esophageal cancer Brother     died at 63  . Kidney disease Brother     Surgical History Past Surgical History  Procedure Laterality Date  . Total abdominal hysterectomy  1999  . Multiple tooth extractions    . Aortogram  04/16/12  . Colonoscopy  2008    normal  . Esophagogastroduodenoscopy  2014    multiple     No Known Allergies  Current Outpatient Prescriptions  Medication Sig Dispense Refill  . acetaminophen (TYLENOL) 500 MG tablet Take 500 mg by mouth every 6 (six) hours as needed for pain.    . Albuterol Sulfate (PROAIR HFA IN) Inhale 2 puffs into the lungs every 6 (six) hours as needed (for shortness of breath).     Marland Kitchen aspirin 81 MG tablet Take 81 mg by mouth daily.    Marland Kitchen atorvastatin (LIPITOR) 20 MG tablet Take 20 mg by mouth daily.    . Budesonide-Formoterol Fumarate (SYMBICORT IN) Inhale 2 puffs into the lungs  2 (two) times daily.    . cetirizine-pseudoephedrine (ZYRTEC-D) 5-120 MG per tablet Take 1 tablet by mouth daily.     . fluticasone (FLONASE) 50 MCG/ACT nasal spray Place 2 sprays into the nose daily. 1 g 0  . levothyroxine (SYNTHROID, LEVOTHROID) 88 MCG tablet Take 100 mcg by mouth daily before breakfast.     . metoprolol succinate (TOPROL-XL) 50 MG 24 hr tablet TAKE ONE TABLET BY MOUTH ONE TIME DAILY 30 tablet 5  . montelukast (SINGULAIR) 10 MG tablet Take 10 mg by mouth at bedtime.    . Multiple Vitamin (MULTIVITAMIN WITH MINERALS) TABS Take 1 tablet by mouth daily.    . pantoprazole (PROTONIX) 40 MG tablet Take 40 mg by mouth 2 (two) times daily.    . traMADol (ULTRAM) 50 MG tablet     . Vitamin D, Ergocalciferol, (DRISDOL) 50000 UNITS CAPS Take 50,000 Units by mouth every 7 (seven) days.     No current facility-administered medications for this  visit.    ROS: see HPI for pertinent positives and negatives    Physical Examination  Filed Vitals:   12/06/14 1056  BP: 129/84  Pulse: 85  Temp: 96.9 F (36.1 C)  Resp: 14  Height: 5\' 6"  (1.676 m)  Weight: 144 lb 9.6 oz (65.59 kg)  SpO2: 95%   Body mass index is 23.35 kg/(m^2).  General: A&O x 3, WDWN.  Pulmonary: Sym exp, good air movt, CTAB, no rales, rhonchi, or wheezing.  Cardiac: RRR, Nl S1, S2, no detected Murmur.  Vascular: Vessel Right Left  Radial Palpable Palpable  Carotid  without bruit  without bruit  Aorta Not palpable N/A  Popliteal Not palpable Not palpable  PT Not Palpable Not Palpable  DP Palpable Palpable   Gastrointestinal: soft, NTND, -G/R, - HSM, - palpable masses, - CVAT.  Musculoskeletal: M/S 5/5 throughout, extremities without ischemic changes.  Neurologic: Pain and light touch intact in extremities, Motor exam as listed above. CN 2-12 intact.   Non-Invasive Vascular Imaging  Mesenteric Duplex (Date: 12/06/2014):  MESENTERIC ARTERY DUPLEX EVALUATION    INDICATION: Follow up SMA    PREVIOUS INTERVENTION(S): PTA of SMA by Dr. Bridgett Larsson for mesenteric ischemia 04/16/2012    DUPLEX EXAM:     ARTERY PEAK SYSTOLIC VELOCITY (cm/s) IMAGE  Aorta 54   Celiac 188   Superior Mesenteric Artery - Proximal 210   Superior Mesenteric Artery - Mid 173   Inferior Mesenteric Artery 132   Hepatic    Splenic    ADDITIONAL FINDINGS:     IMPRESSION: 1. Patent SMA with no visualized stenosis.    Medical Decision Making  [name] is a 81 y.o. female who presents with: asymptomatic chronic mesenteric ischemia. This was treated by PTA of the SMA by Dr. Bridgett Larsson 04/16/2012. She no longer has food fear, does not have post prandial abdominal pain. Her weight has stabilized. Today's mesenteric artery duplex suggests a patent SMA with no visualized stenosis. Proximal and mid SMA PSV on 11/23/13 were 228 and 236 cm/s respectively. Celiac PSV was 88 cm/s.     Based on her exam and studies, I have offered the patient mesenteric artery duplex in a year.  I discussed in depth with the patient the nature of atherosclerosis, and emphasized the importance of maximal medical management including strict control of blood pressure, blood glucose, and lipid levels, obtaining regular exercise, and cessation of smoking.    The patient is aware that without maximal medical management the underlying atherosclerotic disease  process will progress, limiting the benefit of any interventions. The patient is currently on a statin.  The patient is currently on an anti-platelet: ASA.    Thank you for allowing [place] to participate in this patient's care.  Clemon Chambers, RN, MSN, FNP-C [facility] Office: [phone]  Clinic MD: Kellie Simmering  12/06/2014, 11:09 AM

## 2014-12-06 NOTE — Patient Instructions (Signed)
Chronic Mesenteric Ischemia Mesenteric ischemia is a deficiency of blood in an area of the intestine supplied by an artery that supports the intestine. Chronic mesenteric ischemia, also called intestinal angina, is a long-term condition. It happens when an artery or vein that supports the intestine gradually becomes blocked or narrow, restricting the blood supply to the intestine. When the blood supply to the intestine is severely restricted, the intestines cannot function properly because needed oxygen cannot reach them.  CAUSES   Fatty deposits that build up in an artery or vein but have not yet restricted blood flow entirely.  Differences in some people's anatomy.  Rapid weight loss.  Weakened areas in blood vessel walls (aneurysms).  Swelling and inflammation of blood vessels (such as from fibromuscular dysplasia and arteritis).  Disorders of blood clotting.  Scarring and fibrosis of blood vessels after radiation therapy.  Blood vessel problems after drug use, such as use of cocaine. RISK FACTORS  Being female.  Being over age 50 with a history of coronary or vascular disease.  Smoking.  Congestive heart failure.  Diabetes.  High cholesterol.  High blood pressure (hypertension). SIGNS AND SYMPTOMS   Severe stomachache. Some people become fearful of eating because of pain.   Abdominal pain or cramps that develop about 30 minutes after a meal.   Abdominal pain after eating that becomes worse over time.   Diarrhea.   Nausea.   Vomiting.   Bloating.   Weight loss. DIAGNOSIS  Chronic mesenteric ischemia is often diagnosed after the person's history is taken, a physical exam is done, and tests are taken. Tests may include:  Ultrasounds.  CT scans.  Angiography. This is an imaging test that uses a dye to obtain a picture of blood flow to the intestine.  Endoscopy. This involves putting a scope through the mouth, down the throat, and into the stomach and  intestine to view the intestinal wall and take small tissue samples (biopsies).  Tonometry. In this test a tiny probe is passed through the mouth and into the stomach or intestine and left in place for 24 hours or more. It measures the output of carbon dioxide by the affected tissues. TREATMENT  Treatment may include:   Medicines to reduce blood clotting and increase blood flow.   Surgery to remove the blockage, repair arteries or veins, and restore blood flow. This may involve:   Angioplasty. This is surgery to widen the affected artery, reduce the blockage, and sometimes insert a small, mesh tube (stent).   Bypass surgery. This may be performed to bypass the blockage and reconnect healthy arteries or veins.   A stent in the affected area to help keep blocked arteries open. HOME CARE INSTRUCTIONS  Only take over-the-counter or prescription medicines as directed by your health care provider.   Keep all follow-up appointments as directed by your health care provider.   Prevent the condition from occurring by:  Doing regular exercise.  Keeping a healthy weight.  Keeping a healthy diet.  Managing cholesterol levels.  Keeping blood pressure and heart rhythm problems under control.  Not smoking. SEEK IMMEDIATE MEDICAL CARE IF:  You have severe abdominal pain.   You notice blood in your stool.   You have nausea, vomiting, or diarrhea.   You have a fever. MAKE SURE YOU:  Understand these instructions.  Will watch your condition.  Will get help right away if you are not doing well or get worse.   This information is not intended to replace advice   given to you by your health care provider. Make sure you discuss any questions you have with your health care provider.   Document Released: 09/17/2010 Document Revised: 09/30/2012 Document Reviewed: 07/29/2012 [employer] Interactive Patient Education 2016 [employer]  

## 2014-12-13 DIAGNOSIS — J4530 Mild persistent asthma, uncomplicated: Secondary | ICD-10-CM | POA: Diagnosis not present

## 2014-12-13 DIAGNOSIS — H1045 Other chronic allergic conjunctivitis: Secondary | ICD-10-CM | POA: Diagnosis not present

## 2014-12-13 DIAGNOSIS — J3089 Other allergic rhinitis: Secondary | ICD-10-CM | POA: Diagnosis not present

## 2014-12-13 DIAGNOSIS — J209 Acute bronchitis, unspecified: Secondary | ICD-10-CM | POA: Diagnosis not present

## 2015-02-16 DIAGNOSIS — R05 Cough: Secondary | ICD-10-CM | POA: Diagnosis not present

## 2015-02-16 DIAGNOSIS — J209 Acute bronchitis, unspecified: Secondary | ICD-10-CM | POA: Diagnosis not present

## 2015-02-16 DIAGNOSIS — Z6824 Body mass index (BMI) 24.0-24.9, adult: Secondary | ICD-10-CM | POA: Diagnosis not present

## 2015-02-16 DIAGNOSIS — J45998 Other asthma: Secondary | ICD-10-CM | POA: Diagnosis not present

## 2015-03-10 DIAGNOSIS — Z6824 Body mass index (BMI) 24.0-24.9, adult: Secondary | ICD-10-CM | POA: Diagnosis not present

## 2015-03-10 DIAGNOSIS — L509 Urticaria, unspecified: Secondary | ICD-10-CM | POA: Diagnosis not present

## 2015-03-10 DIAGNOSIS — J45998 Other asthma: Secondary | ICD-10-CM | POA: Diagnosis not present

## 2015-03-14 DIAGNOSIS — E038 Other specified hypothyroidism: Secondary | ICD-10-CM | POA: Diagnosis not present

## 2015-03-14 DIAGNOSIS — M810 Age-related osteoporosis without current pathological fracture: Secondary | ICD-10-CM | POA: Diagnosis not present

## 2015-06-13 DIAGNOSIS — J4530 Mild persistent asthma, uncomplicated: Secondary | ICD-10-CM | POA: Diagnosis not present

## 2015-06-13 DIAGNOSIS — J209 Acute bronchitis, unspecified: Secondary | ICD-10-CM | POA: Diagnosis not present

## 2015-06-13 DIAGNOSIS — J3089 Other allergic rhinitis: Secondary | ICD-10-CM | POA: Diagnosis not present

## 2015-06-13 DIAGNOSIS — H1045 Other chronic allergic conjunctivitis: Secondary | ICD-10-CM | POA: Diagnosis not present

## 2015-06-16 DIAGNOSIS — H40013 Open angle with borderline findings, low risk, bilateral: Secondary | ICD-10-CM | POA: Diagnosis not present

## 2015-06-16 DIAGNOSIS — Z961 Presence of intraocular lens: Secondary | ICD-10-CM | POA: Diagnosis not present

## 2015-06-16 DIAGNOSIS — H353121 Nonexudative age-related macular degeneration, left eye, early dry stage: Secondary | ICD-10-CM | POA: Diagnosis not present

## 2015-08-08 DIAGNOSIS — L509 Urticaria, unspecified: Secondary | ICD-10-CM | POA: Diagnosis not present

## 2015-08-08 DIAGNOSIS — W57XXXA Bitten or stung by nonvenomous insect and other nonvenomous arthropods, initial encounter: Secondary | ICD-10-CM | POA: Diagnosis not present

## 2015-08-08 DIAGNOSIS — Z6824 Body mass index (BMI) 24.0-24.9, adult: Secondary | ICD-10-CM | POA: Diagnosis not present

## 2015-08-08 DIAGNOSIS — M791 Myalgia: Secondary | ICD-10-CM | POA: Diagnosis not present

## 2015-08-08 DIAGNOSIS — J45909 Unspecified asthma, uncomplicated: Secondary | ICD-10-CM | POA: Diagnosis not present

## 2015-08-08 DIAGNOSIS — R51 Headache: Secondary | ICD-10-CM | POA: Diagnosis not present

## 2015-09-06 DIAGNOSIS — Z1231 Encounter for screening mammogram for malignant neoplasm of breast: Secondary | ICD-10-CM | POA: Diagnosis not present

## 2015-09-19 DIAGNOSIS — L501 Idiopathic urticaria: Secondary | ICD-10-CM | POA: Diagnosis not present

## 2015-09-19 DIAGNOSIS — A6920 Lyme disease, unspecified: Secondary | ICD-10-CM | POA: Diagnosis not present

## 2015-09-19 DIAGNOSIS — Z808 Family history of malignant neoplasm of other organs or systems: Secondary | ICD-10-CM | POA: Diagnosis not present

## 2015-09-19 DIAGNOSIS — L821 Other seborrheic keratosis: Secondary | ICD-10-CM | POA: Diagnosis not present

## 2015-09-20 DIAGNOSIS — L508 Other urticaria: Secondary | ICD-10-CM | POA: Diagnosis not present

## 2015-09-20 DIAGNOSIS — L501 Idiopathic urticaria: Secondary | ICD-10-CM | POA: Diagnosis not present

## 2015-09-20 DIAGNOSIS — A6920 Lyme disease, unspecified: Secondary | ICD-10-CM | POA: Diagnosis not present

## 2015-12-06 DIAGNOSIS — R8299 Other abnormal findings in urine: Secondary | ICD-10-CM | POA: Diagnosis not present

## 2015-12-06 DIAGNOSIS — E038 Other specified hypothyroidism: Secondary | ICD-10-CM | POA: Diagnosis not present

## 2015-12-06 DIAGNOSIS — R7301 Impaired fasting glucose: Secondary | ICD-10-CM | POA: Diagnosis not present

## 2015-12-06 DIAGNOSIS — N390 Urinary tract infection, site not specified: Secondary | ICD-10-CM | POA: Diagnosis not present

## 2015-12-06 DIAGNOSIS — E781 Pure hyperglyceridemia: Secondary | ICD-10-CM | POA: Diagnosis not present

## 2015-12-06 DIAGNOSIS — I10 Essential (primary) hypertension: Secondary | ICD-10-CM | POA: Diagnosis not present

## 2015-12-12 ENCOUNTER — Ambulatory Visit: Payer: Medicare Other | Admitting: Family

## 2015-12-12 ENCOUNTER — Encounter (HOSPITAL_COMMUNITY): Payer: Medicare Other

## 2015-12-13 DIAGNOSIS — J45998 Other asthma: Secondary | ICD-10-CM | POA: Diagnosis not present

## 2015-12-13 DIAGNOSIS — I10 Essential (primary) hypertension: Secondary | ICD-10-CM | POA: Diagnosis not present

## 2015-12-13 DIAGNOSIS — E038 Other specified hypothyroidism: Secondary | ICD-10-CM | POA: Diagnosis not present

## 2015-12-13 DIAGNOSIS — N183 Chronic kidney disease, stage 3 (moderate): Secondary | ICD-10-CM | POA: Diagnosis not present

## 2015-12-13 DIAGNOSIS — Z1389 Encounter for screening for other disorder: Secondary | ICD-10-CM | POA: Diagnosis not present

## 2015-12-13 DIAGNOSIS — M542 Cervicalgia: Secondary | ICD-10-CM | POA: Diagnosis not present

## 2015-12-13 DIAGNOSIS — Z0000 Encounter for general adult medical examination without abnormal findings: Secondary | ICD-10-CM | POA: Diagnosis not present

## 2015-12-13 DIAGNOSIS — R7301 Impaired fasting glucose: Secondary | ICD-10-CM | POA: Diagnosis not present

## 2015-12-13 DIAGNOSIS — E784 Other hyperlipidemia: Secondary | ICD-10-CM | POA: Diagnosis not present

## 2015-12-13 DIAGNOSIS — M810 Age-related osteoporosis without current pathological fracture: Secondary | ICD-10-CM | POA: Diagnosis not present

## 2015-12-13 DIAGNOSIS — L508 Other urticaria: Secondary | ICD-10-CM | POA: Diagnosis not present

## 2015-12-13 DIAGNOSIS — Z6824 Body mass index (BMI) 24.0-24.9, adult: Secondary | ICD-10-CM | POA: Diagnosis not present

## 2015-12-13 DIAGNOSIS — Z23 Encounter for immunization: Secondary | ICD-10-CM | POA: Diagnosis not present

## 2015-12-18 DIAGNOSIS — H1045 Other chronic allergic conjunctivitis: Secondary | ICD-10-CM | POA: Diagnosis not present

## 2015-12-18 DIAGNOSIS — J3089 Other allergic rhinitis: Secondary | ICD-10-CM | POA: Diagnosis not present

## 2015-12-18 DIAGNOSIS — J4530 Mild persistent asthma, uncomplicated: Secondary | ICD-10-CM | POA: Diagnosis not present

## 2016-01-25 ENCOUNTER — Encounter: Payer: Self-pay | Admitting: Family

## 2016-01-31 ENCOUNTER — Ambulatory Visit (INDEPENDENT_AMBULATORY_CARE_PROVIDER_SITE_OTHER): Payer: Medicare Other | Admitting: Family

## 2016-01-31 ENCOUNTER — Encounter: Payer: Self-pay | Admitting: Family

## 2016-01-31 ENCOUNTER — Ambulatory Visit (HOSPITAL_COMMUNITY)
Admission: RE | Admit: 2016-01-31 | Discharge: 2016-01-31 | Disposition: A | Discharge status: Home / Self Care | Payer: Medicare Other | Source: Ambulatory Visit | Attending: Family | Admitting: Family

## 2016-01-31 VITALS — BP 143/72 | HR 85 | Temp 98.5°F | Resp 18 | Ht 66.0 in | Wt 144.0 lb

## 2016-01-31 DIAGNOSIS — Z9862 Peripheral vascular angioplasty status: Secondary | ICD-10-CM | POA: Diagnosis not present

## 2016-01-31 DIAGNOSIS — K551 Chronic vascular disorders of intestine: Secondary | ICD-10-CM | POA: Insufficient documentation

## 2016-01-31 DIAGNOSIS — Z48812 Encounter for surgical aftercare following surgery on the circulatory system: Secondary | ICD-10-CM | POA: Diagnosis not present

## 2016-01-31 NOTE — Progress Notes (Signed)
CC: Follow up Mesenteric Ischemia  History of Present Illness  Megan Foster is a 82 y.o. ([birth year]) female patient of Dr. 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 This was treated by PTA of the SMA by Dr. Bridgett Larsson on 04/16/2012. Patient had lost 30 pounds and was having classic mesenteric ischemia symptoms with postprandial pain. The symptoms have been completely relieved and she returned to her normal weight.   Her bowel movements are normal and she has no abdominal discomfort.  She is drinking an Ensure daily. Pt denies any history of stroke or TIA. Wt was 144 on 12-06-14, same today.  She is having problems with her new dentures.   She has never used tobacco, does not have DM. She takes a statin, ASA; she takes no anticoagulants.    Past Medical History:  Diagnosis Date  . Arthritis   . Asthma   . Depression   . Elevated LFTs   . Esophageal dysmotility   . GERD (gastroesophageal reflux disease)   . Headache(784.0)   . Hiatal hernia   . Hyperplastic polyps of stomach   . Hypertension   . Hypothyroid   . Irritable bowel syndrome   . Nausea & vomiting 04/02/2012  . Osteoporosis   . Shingles   . Sinusitis   . Tachycardia   . Vitamin D deficiency     Social History Social History  Substance Use Topics  . Smoking status: Never Smoker  . Smokeless tobacco: Never Used  . Alcohol use No     Comment: <1 day    Family History Family History  Problem Relation Age of Onset  . Hypertension Mother   . Heart attack Mother   . Heart attack Father 2  . Hypertension Father   . Peripheral vascular disease Father   . Heart failure Sister   . Cancer Sister   . Hypertension Brother   . Cancer Brother   . Hypertension Brother   . Breast cancer Sister   . Ovarian cancer      mat cousin  . Prostate cancer Maternal Uncle   . Colon polyps Sister     siblings  . Colon cancer      Maternal Aunt x3  Maternal Uncle x 2  . Esophageal cancer Brother     died at 81  . Kidney disease Brother     Surgical History Past Surgical History:  Procedure Laterality Date  . AORTOGRAM  04/16/12  . COLONOSCOPY  2008   normal  . ESOPHAGOGASTRODUODENOSCOPY  2014   multiple   . MULTIPLE TOOTH EXTRACTIONS    . TOTAL ABDOMINAL HYSTERECTOMY  1999    No Known Allergies  Current Outpatient Prescriptions  Medication Sig Dispense Refill  . acetaminophen (TYLENOL) 500 MG tablet Take 500 mg by mouth every 6 (six) hours as needed for pain.    . Albuterol Sulfate (PROAIR HFA IN) Inhale 2 puffs into the lungs every 6 (six) hours as needed (for shortness of breath).     Marland Kitchen aspirin 81 MG tablet Take 81 mg by mouth daily.    Marland Kitchen atorvastatin (LIPITOR) 20 MG tablet Take 20 mg by mouth daily.    . cetirizine-pseudoephedrine (ZYRTEC-D) 5-120 MG per tablet Take 1 tablet by mouth daily.     . diphenhydrAMINE (BENADRYL) 25 mg capsule Take 25 mg by mouth every 6 (six) hours as needed.    . fluticasone furoate-vilanterol (BREO ELLIPTA) 200-25 MCG/INH AEPB Inhale 1  puff into the lungs daily.    Marland Kitchen levothyroxine (SYNTHROID, LEVOTHROID) 88 MCG tablet Take 100 mcg by mouth daily before breakfast.     . metoprolol succinate (TOPROL-XL) 50 MG 24 hr tablet TAKE ONE TABLET BY MOUTH ONE TIME DAILY 30 tablet 5  . montelukast (SINGULAIR) 10 MG tablet Take 10 mg by mouth at bedtime.    . Multiple Vitamin (MULTIVITAMIN WITH MINERALS) TABS Take 1 tablet by mouth daily.    . Multiple Vitamins-Minerals (PRESERVISION AREDS 2 PO) Take by mouth.    . pantoprazole (PROTONIX) 40 MG tablet Take 40 mg by mouth 2 (two) times daily.    . Vitamin D, Ergocalciferol, (DRISDOL) 50000 UNITS CAPS Take 50,000 Units by mouth every 7 (seven) days.    . Budesonide-Formoterol Fumarate (SYMBICORT IN) Inhale 2 puffs into the lungs 2 (two) times daily.    . fluticasone (FLONASE) 50 MCG/ACT nasal spray Place 2 sprays into the nose daily. 1 g 0  . traMADol  (ULTRAM) 50 MG tablet      No current facility-administered medications for this visit.     ROS: see HPI for pertinent positives and negatives    Physical Examination  Vitals:   01/31/16 1157  BP: (!) 143/72  Pulse: 85  Resp: 18  Temp: 98.5 F (36.9 C)  TempSrc: Oral  SpO2: 97%  Weight: 144 lb (65.3 kg)  Height: 5\' 6"  (1.676 m)   Body mass index is 23.24 kg/m.  General: A&O x 3, WDWN.  Pulmonary: Sym exp, respirations are non labored, good air movt, CTAB, no rales, rhonchi, or wheezing.  Cardiac: RRR, Nl S1, S2, no detected murmur.  Vascular: Vessel Right Left  Radial Palpable Palpable  Carotid  without bruit  without bruit  Aorta Not palpable N/A  Popliteal Not palpable Not palpable  PT Not Palpable Not Palpable  DP Palpable Palpable   Gastrointestinal: soft, NTND, -G/R, - HSM, - palpable masses, - CVAT.  Musculoskeletal: M/S 5/5 throughout, extremities without ischemic changes.  Neurologic: Pain and light touch intact in extremities, Motor exam as listed above. CN 2-12 intact   Non-Invasive Vascular Imaging  Mesenteric Duplex (Date: 01/31/2016):   Ao: 72 cm/s  Celiac artery: 165  SMA: Proximal: 221; Mid: 205  IMA: not visualized  Medical Decision Making  Megan Foster is a 82 y.o. female who presents with asymptomatic chronic mesenteric ischemia. This was treated by P[facility] of the [facility] by Dr. Bridgett Larsson 04/16/2012. She no longer has food fear, does not have post prandial abdominal pain. Her weight has stabilized.  Today's mesenteric artery duplex suggests a patent SMA with <70% stenosis. No significant change compared to the last exam on 12-06-14. Proximal and mid SMA PSV on 11/23/13 were 228 and 236 cm/s respectively. Celiac PSV was 88 cm/s.    Based on her exam and studies, I have offered the patient return in 1 year with mesenteric duplex.  I advised her to notify us if she develops food fear, post prandial abdominal pain, or weight loss.  I  discussed in depth with the patient the nature of atherosclerosis, and emphasized the importance of maximal medical management including strict control of blood pressure, blood glucose, and lipid levels, obtaining regular exercise, and cessation of smoking.    The patient is aware that without maximal medical management the underlying atherosclerotic disease process will progress, limiting the benefit of any interventions. The patient is currently on a statin. The patient is currently on an anti-platelet.  Thank you for  allowing [place] to participate in this patient's care.  Clemon Chambers, RN, MSN, FNP-C [facility] Office: [phone]  Clinic MD: Oneida Alar  01/31/2016, 12:12 PM

## 2016-01-31 NOTE — Patient Instructions (Signed)
Chronic Mesenteric Ischemia Mesenteric ischemia is a deficiency of blood in an area of the intestine supplied by an artery that supports the intestine. Chronic mesenteric ischemia, also called intestinal angina, is a long-term condition. It happens when an artery or vein that supports the intestine gradually becomes blocked or narrow, restricting the blood supply to the intestine. When the blood supply to the intestine is severely restricted, the intestines cannot function properly because needed oxygen cannot reach them.  CAUSES   Fatty deposits that build up in an artery or vein but have not yet restricted blood flow entirely.  Differences in some people's anatomy.  Rapid weight loss.  Weakened areas in blood vessel walls (aneurysms).  Swelling and inflammation of blood vessels (such as from fibromuscular dysplasia and arteritis).  Disorders of blood clotting.  Scarring and fibrosis of blood vessels after radiation therapy.  Blood vessel problems after drug use, such as use of cocaine. RISK FACTORS  Being female.  Being over age 50 with a history of coronary or vascular disease.  Smoking.  Congestive heart failure.  Diabetes.  High cholesterol.  High blood pressure (hypertension). SIGNS AND SYMPTOMS   Severe stomachache. Some people become fearful of eating because of pain.   Abdominal pain or cramps that develop about 30 minutes after a meal.   Abdominal pain after eating that becomes worse over time.   Diarrhea.   Nausea.   Vomiting.   Bloating.   Weight loss. DIAGNOSIS  Chronic mesenteric ischemia is often diagnosed after the person's history is taken, a physical exam is done, and tests are taken. Tests may include:  Ultrasounds.  CT scans.  Angiography. This is an imaging test that uses a dye to obtain a picture of blood flow to the intestine.  Endoscopy. This involves putting a scope through the mouth, down the throat, and into the stomach and  intestine to view the intestinal wall and take small tissue samples (biopsies).  Tonometry. In this test a tiny probe is passed through the mouth and into the stomach or intestine and left in place for 24 hours or more. It measures the output of carbon dioxide by the affected tissues. TREATMENT  Treatment may include:   Medicines to reduce blood clotting and increase blood flow.   Surgery to remove the blockage, repair arteries or veins, and restore blood flow. This may involve:   Angioplasty. This is surgery to widen the affected artery, reduce the blockage, and sometimes insert a small, mesh tube (stent).   Bypass surgery. This may be performed to bypass the blockage and reconnect healthy arteries or veins.   A stent in the affected area to help keep blocked arteries open. HOME CARE INSTRUCTIONS  Only take over-the-counter or prescription medicines as directed by your health care provider.   Keep all follow-up appointments as directed by your health care provider.   Prevent the condition from occurring by:  Doing regular exercise.  Keeping a healthy weight.  Keeping a healthy diet.  Managing cholesterol levels.  Keeping blood pressure and heart rhythm problems under control.  Not smoking. SEEK IMMEDIATE MEDICAL CARE IF:  You have severe abdominal pain.   You notice blood in your stool.   You have nausea, vomiting, or diarrhea.   You have a fever. MAKE SURE YOU:  Understand these instructions.  Will watch your condition.  Will get help right away if you are not doing well or get worse. This information is not intended to replace advice given to   you by your health care provider. Make sure you discuss any questions you have with your health care provider. Document Released: 09/17/2010 Document Revised: 09/30/2012 Document Reviewed: 07/29/2012 Elsevier Interactive Patient Education  2017 [employer]  

## 2016-05-22 DIAGNOSIS — M810 Age-related osteoporosis without current pathological fracture: Secondary | ICD-10-CM | POA: Diagnosis not present

## 2016-05-22 DIAGNOSIS — E038 Other specified hypothyroidism: Secondary | ICD-10-CM | POA: Diagnosis not present

## 2016-05-22 DIAGNOSIS — E559 Vitamin D deficiency, unspecified: Secondary | ICD-10-CM | POA: Diagnosis not present

## 2016-06-06 DIAGNOSIS — M79605 Pain in left leg: Secondary | ICD-10-CM | POA: Diagnosis not present

## 2016-06-06 DIAGNOSIS — S82832A Other fracture of upper and lower end of left fibula, initial encounter for closed fracture: Secondary | ICD-10-CM | POA: Diagnosis not present

## 2016-06-06 DIAGNOSIS — W19XXXA Unspecified fall, initial encounter: Secondary | ICD-10-CM | POA: Diagnosis not present

## 2016-06-06 DIAGNOSIS — M810 Age-related osteoporosis without current pathological fracture: Secondary | ICD-10-CM | POA: Diagnosis not present

## 2016-06-06 DIAGNOSIS — Z6824 Body mass index (BMI) 24.0-24.9, adult: Secondary | ICD-10-CM | POA: Diagnosis not present

## 2016-06-06 DIAGNOSIS — R269 Unspecified abnormalities of gait and mobility: Secondary | ICD-10-CM | POA: Diagnosis not present

## 2016-06-18 DIAGNOSIS — H40013 Open angle with borderline findings, low risk, bilateral: Secondary | ICD-10-CM | POA: Diagnosis not present

## 2016-06-18 DIAGNOSIS — Z961 Presence of intraocular lens: Secondary | ICD-10-CM | POA: Diagnosis not present

## 2016-06-18 DIAGNOSIS — H353121 Nonexudative age-related macular degeneration, left eye, early dry stage: Secondary | ICD-10-CM | POA: Diagnosis not present

## 2016-06-18 DIAGNOSIS — H26491 Other secondary cataract, right eye: Secondary | ICD-10-CM | POA: Diagnosis not present

## 2016-07-05 DIAGNOSIS — J4530 Mild persistent asthma, uncomplicated: Secondary | ICD-10-CM | POA: Diagnosis not present

## 2016-07-05 DIAGNOSIS — J3089 Other allergic rhinitis: Secondary | ICD-10-CM | POA: Diagnosis not present

## 2016-07-05 DIAGNOSIS — H1045 Other chronic allergic conjunctivitis: Secondary | ICD-10-CM | POA: Diagnosis not present

## 2016-07-10 DIAGNOSIS — S82832D Other fracture of upper and lower end of left fibula, subsequent encounter for closed fracture with routine healing: Secondary | ICD-10-CM | POA: Diagnosis not present

## 2016-12-06 DIAGNOSIS — R7301 Impaired fasting glucose: Secondary | ICD-10-CM | POA: Diagnosis not present

## 2016-12-06 DIAGNOSIS — E038 Other specified hypothyroidism: Secondary | ICD-10-CM | POA: Diagnosis not present

## 2016-12-06 DIAGNOSIS — E781 Pure hyperglyceridemia: Secondary | ICD-10-CM | POA: Diagnosis not present

## 2016-12-06 DIAGNOSIS — I10 Essential (primary) hypertension: Secondary | ICD-10-CM | POA: Diagnosis not present

## 2016-12-06 DIAGNOSIS — M810 Age-related osteoporosis without current pathological fracture: Secondary | ICD-10-CM | POA: Diagnosis not present

## 2016-12-09 DIAGNOSIS — Z1231 Encounter for screening mammogram for malignant neoplasm of breast: Secondary | ICD-10-CM | POA: Diagnosis not present

## 2016-12-09 DIAGNOSIS — R82998 Other abnormal findings in urine: Secondary | ICD-10-CM | POA: Diagnosis not present

## 2016-12-11 DIAGNOSIS — J45909 Unspecified asthma, uncomplicated: Secondary | ICD-10-CM | POA: Diagnosis not present

## 2016-12-11 DIAGNOSIS — Z6823 Body mass index (BMI) 23.0-23.9, adult: Secondary | ICD-10-CM | POA: Diagnosis not present

## 2016-12-11 DIAGNOSIS — K551 Chronic vascular disorders of intestine: Secondary | ICD-10-CM | POA: Diagnosis not present

## 2016-12-11 DIAGNOSIS — N183 Chronic kidney disease, stage 3 (moderate): Secondary | ICD-10-CM | POA: Diagnosis not present

## 2016-12-11 DIAGNOSIS — Z1389 Encounter for screening for other disorder: Secondary | ICD-10-CM | POA: Diagnosis not present

## 2016-12-11 DIAGNOSIS — Z23 Encounter for immunization: Secondary | ICD-10-CM | POA: Diagnosis not present

## 2016-12-11 DIAGNOSIS — R7301 Impaired fasting glucose: Secondary | ICD-10-CM | POA: Diagnosis not present

## 2016-12-11 DIAGNOSIS — E7849 Other hyperlipidemia: Secondary | ICD-10-CM | POA: Diagnosis not present

## 2016-12-11 DIAGNOSIS — Z0000 Encounter for general adult medical examination without abnormal findings: Secondary | ICD-10-CM | POA: Diagnosis not present

## 2016-12-11 DIAGNOSIS — E781 Pure hyperglyceridemia: Secondary | ICD-10-CM | POA: Diagnosis not present

## 2016-12-11 DIAGNOSIS — I10 Essential (primary) hypertension: Secondary | ICD-10-CM | POA: Diagnosis not present

## 2016-12-11 DIAGNOSIS — E038 Other specified hypothyroidism: Secondary | ICD-10-CM | POA: Diagnosis not present

## 2017-01-31 ENCOUNTER — Ambulatory Visit (INDEPENDENT_AMBULATORY_CARE_PROVIDER_SITE_OTHER): Payer: Medicare Other | Admitting: Family

## 2017-01-31 ENCOUNTER — Ambulatory Visit (HOSPITAL_COMMUNITY)
Admission: RE | Admit: 2017-01-31 | Discharge: 2017-01-31 | Disposition: A | Discharge status: Home / Self Care | Payer: Medicare Other | Source: Ambulatory Visit | Attending: Family | Admitting: Family

## 2017-01-31 ENCOUNTER — Encounter: Payer: Self-pay | Admitting: Family

## 2017-01-31 VITALS — BP 145/73 | HR 95 | Temp 97.0°F | Resp 18 | Wt 140.7 lb

## 2017-01-31 DIAGNOSIS — K551 Chronic vascular disorders of intestine: Secondary | ICD-10-CM | POA: Diagnosis not present

## 2017-01-31 NOTE — Patient Instructions (Addendum)
Before your next abdominal ultrasound:  Take two Extra-Strength Gas-X capsules at bedtime the night before the test. Take another two Extra-Strength Gas-X capsules 3 hours before the test.  Avoid gas forming foods the day before the test.      Chronic Mesenteric Ischemia Mesenteric ischemia is poor blood flow (circulation) in the vessels that supply blood to the stomach, intestines, and liver (mesenteric organs). Chronic mesenteric ischemia, also called mesenteric angina or intestinal angina, is a long-term (chronic) condition. It happens when an artery or vein that provides blood to the mesenteric organs gradually becomes blocked or narrow, restricting the blood supply to the organs. When the blood supply is severely restricted, the mesenteric organs cannot work properly. What are the causes? This condition is commonly caused by fatty deposits that build up in an artery (plaque), which can narrow the artery and restrict blood flow. Other causes include:  Weakened areas in blood vessel walls (aneurysms).  Conditions that cause twisting or inflammation of blood vessels, such as fibromuscular dysplasia or arteritis.  A disorder in which blood clots form in the veins (venous thrombosis).  Scarring and thickening (fibrosis) of blood vessels caused by radiation therapy.  A tear in the aorta, the body's main artery (aortic dissection).  Blood vessel problems after illegal drug use, such as use of cocaine.  Tumors in the nervous system (neurofibromatosis).  Certain autoimmune diseases, such as lupus.  What increases the risk? The following factors may make you more likely to develop this condition:  Being female.  Being over age 25, especially if you have a history of heart problems.  Smoking.  Congestive heart failure.  Irregular heartbeat (arrhythmia).  Having a history of heart attack or stroke.  Diabetes.  High cholesterol.  High blood pressure (hypertension).  Being  overweight or obese.  Kidney disease (renal disease) requiring dialysis.  What are the signs or symptoms? Symptoms of this condition include:  Abdomen (abdominal) pain or cramps that develop 15-60 minutes after a meal. This pain may last for 1-3 hours. Some people may develop a fear of eating because of this symptom.  Weight loss.  Diarrhea.  Bloody stool.  Nausea.  Vomiting.  Bloating.  Abdominal pain after stress or with exercise.  How is this diagnosed? This condition is diagnosed based on:  Your medical history.  A physical exam.  Tests, such as: ? Ultrasound. ? CT scan. ? Blood tests. ? Urine tests. ? An imaging test that involves injecting a dye into your arteries to show blood flow through blood vessels (angiogram). This can help to show if there are any blockages in the vessels that lead to the intestines. ? Passing a small probe through the mouth and into the stomach to measure the output of carbon dioxide (gastric tonometry). This can help to indicate whether there is decreased blood flow to the stomach and intestines.  How is this treated? This condition may be treated with:  Dietary changes such as eating smaller, low-fat, meals more frequently.  Lifestyle changes to treat underlying conditions that contribute to the disease, such as high cholesterol and high blood pressure.  Medicines to reduce blood clotting and increase blood flow.  Surgery to remove the blockage, repair arteries or veins, and restore blood flow. This may involve: ? Angioplasty. This is surgery to widen the affected artery, reduce the blockage, and sometimes insert a small, mesh tube (stent). ? Bypass surgery. This may be done to go around (bypass) the blockage and reconnect healthy arteries or  veins. ? Placing a stent in the affected area. This may be done to help keep blocked arteries open.  Follow these instructions at home: Eating and drinking  Eat a heart-healthy diet. This  includes fresh fruits and vegetables, whole grains, and lean proteins like chicken, fish, eggs, and beans.  Avoid foods that contain a lot of: ? Salt (sodium). ? Sugar. ? Saturated fat (such as red meat). ? Trans fat (such as fried foods).  Stay hydrated. Drink enough fluid to keep your urine clear or pale yellow. Lifestyle  Stay active and get regular exercise as told by your health care provider. Aim for 150 minutes of moderate activity or 75 minutes of vigorous activity a week. Ask your health care provider what activities and forms of exercise are safe for you.  Maintain a healthy weight.  Work with your health care provider to manage your cholesterol.  Manage any other health problems you have, such as high blood pressure, diabetes, or heart rhythm problems.  Do not use any products that contain nicotine or tobacco, such as cigarettes and e-cigarettes. If you need help quitting, ask your health care provider. General instructions  Take over-the-counter and prescription medicines only as told by your health care provider.  Keep all follow-up visits as told by your health care provider. This is important. Contact a health care provider if:  Your symptoms do not improve or they return after treatment.  You have a fever. Get help right away if:  You have severe abdominal pain.  You have severe chest pain.  You have shortness of breath.  You feel weak or dizzy.  You have palpitations.  You have numbness or weakness in your face, arm, or leg.  You are confused.  You have trouble speaking or people have trouble understanding what you are saying.  You are constipated.  You have trouble urinating.  You have blood in your stool.  You have severe nausea, vomiting, or persistent diarrhea. Summary  Mesenteric ischemia is poor circulation in the vessels that supply blood to the the stomach, intestines, and liver (mesenteric organs).  This condition happens when an  artery or vein that provides blood to the mesenteric organs gradually becomes blocked or narrow, restricting the blood supply to the organs.  This condition is commonly caused by fatty deposits that build up in an artery (plaque), which can narrow the artery and restrict blood flow.  You are more likely to develop this condition if you are over age 36 and have a history of heart problems, high blood pressure, diabetes, or high cholesterol.  This condition is usually treated with medicines, dietary and lifestyle changes, and surgery to remove the blockage, repair arteries or veins, and restore blood flow. This information is not intended to replace advice given to you by your health care provider. Make sure you discuss any questions you have with your health care provider. Document Released: 09/17/2010 Document Revised: 01/13/2016 Document Reviewed: 01/13/2016 Elsevier Interactive Patient Education  2017 [employer].

## 2017-01-31 NOTE — Progress Notes (Signed)
CC: Follow up Mesenteric Ischemia  History of Present Illness  Megan Foster is a 83 y.o. (11-Feb-1928) female patient of Dr. 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 This was treated by PTA of the [facility] by Dr. Bridgett Larsson on 04/16/2012. Patient had lost 30 pounds and was having classic mesenteric ischemia symptoms with postprandial pain. The symptoms have been completely resolved and she returned to her normal weight.  She lost 4 pounds in the last year, she attributes this to non fitting dentures, not able to chew.    Her bowel movements are normal and she has no abdominal discomfort.  She is drinking an Ensure daily. Pt denies any history of stroke or TIA. She denies claudication type symptoms with walking.   She has never used tobacco, does not have DM. She takes a statin, ASA; she takes no anticoagulants.    Past Medical History:  Diagnosis Date  . Arthritis   . Asthma   . Depression   . Elevated LFTs   . Esophageal dysmotility   . GERD (gastroesophageal reflux disease)   . Headache(784.0)   . Hiatal hernia   . Hyperplastic polyps of stomach   . Hypertension   . Hypothyroid   . Irritable bowel syndrome   . Nausea & vomiting 04/02/2012  . Osteoporosis   . Shingles   . Sinusitis   . Tachycardia   . Vitamin D deficiency     Social History Social History   Tobacco Use  . Smoking status: Never Smoker  . Smokeless tobacco: Never Used  Substance Use Topics  . Alcohol use: No    Alcohol/week: 0.0 oz    Comment: <1 day  . Drug use: No    Family History Family History  Problem Relation Age of Onset  . Hypertension Mother   . Heart attack Mother   . Heart attack Father 26  . Hypertension Father   . Peripheral vascular disease Father   . Heart failure Sister   . Cancer Sister   . Hypertension Brother   . Cancer Brother   . Hypertension Brother   . Breast cancer Sister   . Ovarian cancer  Unknown        mat cousin  . Prostate cancer Maternal Uncle   . Colon polyps Sister        siblings  . Colon cancer Unknown        Maternal Aunt x3 Maternal Uncle x 2  . Esophageal cancer Brother        died at 37  . Kidney disease Brother     Surgical History Past Surgical History:  Procedure Laterality Date  . AORTOGRAM  04/16/12  . COLONOSCOPY  2008   normal  . ESOPHAGOGASTRODUODENOSCOPY  2014   multiple   . MULTIPLE TOOTH EXTRACTIONS    . TOTAL ABDOMINAL HYSTERECTOMY  1999    No Known Allergies  Current Outpatient Medications  Medication Sig Dispense Refill  . acetaminophen (TYLENOL) 500 MG tablet Take 500 mg by mouth every 6 (six) hours as needed for pain.    . Albuterol Sulfate (PROAIR HFA IN) Inhale 2 puffs into the lungs every 6 (six) hours as needed (for shortness of breath).     Marland Kitchen aspirin 81 MG tablet Take 81 mg by mouth daily.    Marland Kitchen atorvastatin (LIPITOR) 20 MG tablet Take 20 mg by mouth daily.    . cetirizine-pseudoephedrine (ZYRTEC-D) 5-120 MG per tablet Take 1  tablet by mouth daily.     . diphenhydrAMINE (BENADRYL) 25 mg capsule Take 25 mg by mouth every 6 (six) hours as needed.    . fluticasone furoate-vilanterol (BREO ELLIPTA) 200-25 MCG/INH AEPB Inhale 1 puff into the lungs daily.    Marland Kitchen levothyroxine (SYNTHROID, LEVOTHROID) 88 MCG tablet Take 100 mcg by mouth daily before breakfast.     . metoprolol succinate (TOPROL-XL) 50 MG 24 hr tablet TAKE ONE TABLET BY MOUTH ONE TIME DAILY 30 tablet 5  . montelukast (SINGULAIR) 10 MG tablet Take 10 mg by mouth at bedtime.    . Multiple Vitamin (MULTIVITAMIN WITH MINERALS) TABS Take 1 tablet by mouth daily.    . Multiple Vitamins-Minerals (PRESERVISION AREDS 2 PO) Take by mouth.    . pantoprazole (PROTONIX) 40 MG tablet Take 40 mg by mouth 2 (two) times daily.    . Vitamin D, Ergocalciferol, (DRISDOL) 50000 UNITS CAPS Take 50,000 Units by mouth every 7 (seven) days.    . fluticasone (FLONASE) 50 MCG/ACT nasal spray Place 2  sprays into the nose daily. 1 g 0   No current facility-administered medications for this visit.     ROS: see HPI for pertinent positives and negatives    Physical Examination  Vitals:   01/31/17 1157 01/31/17 1158  BP: (!) 144/85 (!) 145/73  Pulse: 95   Resp: 18   Temp: (!) 97 F (36.1 C)   TempSrc: Oral   SpO2: 97%   Weight: 140 lb 11.2 oz (63.8 kg)    Body mass index is 22.71 kg/m.  General: A&O x 3, WDWN.  HEENT: grossly WNL.   Pulmonary: Sym exp, respirations are non labored, good air movement in all fields, CTAB, no rales, rhonchi, or wheezing.  Cardiac: Regular rhythm and rate, no detected murmur.  Vascular: Vessel Right Left  Radial Palpable Palpable  Carotid without bruit without bruit  Aorta Not palpable N/A  Popliteal Not palpable Not palpable  PT Not Palpable Not Palpable  DP Palpable Palpable   Gastrointestinal: soft, NTND, -G/R, - HSM, - palpable masses, - CVAT.  Musculoskeletal: M/S 5/5 throughout, extremities without ischemic changes.  Skin: no rash, no cellulitis, no ulcers noted.   Neurologic: Pain and light touch intact in extremities, Motor exam as listed above. CN 2-12 intact   DATA Mesenteric Duplex (Date: 01/31/2017):   Ao: 72 cm/s  Celiac artery: 202 cm/s  SMA proximal: 221 cm/s  SMA mid: 165 cm/s  IMA: NV  Patent SMA with <70% stenosis  Technically difficult exam due to bowel gas.   Medical Decision Making  Megan Foster is a 83 y.o. female who presents with asymptomatic chronic mesenteric ischemia. This was treated by PTA of the SMA by Dr. Bridgett Larsson 04/16/2012. She no longer has food fear, does not have post prandial abdominal pain. Her weight has stabilized.   Based on her exam and studies, I have offered the patient return in 1 year with mesenteric duplex.  I advised her that if she develops pain in her stomach after eating to notify us.    I discussed in depth with the patient the nature of  atherosclerosis, and emphasized the importance of maximal medical management including strict control of blood pressure, blood glucose, and lipid levels, obtaining regular exercise, and cessation of smoking.    The patient is aware that without maximal medical management the underlying atherosclerotic disease process will progress, limiting the benefit of any interventions. The patient is currently on a statin: atorvastatin 20 mg  daily.   The patient is currently on an anti-platelet: ASA 81 mg daily.    Thank you for allowing [place] to participate in this patient's care.  Clemon Chambers, RN, MSN, FNP-C [facility] Office: [phone]  Clinic MD: Donzetta Matters  01/31/2017, 12:16 PM

## 2017-02-03 NOTE — Addendum Note (Signed)
Addended by: Lianne Cure A on: 02/03/2017 09:56 AM   Modules accepted: Orders

## 2017-06-18 DIAGNOSIS — H353122 Nonexudative age-related macular degeneration, left eye, intermediate dry stage: Secondary | ICD-10-CM | POA: Diagnosis not present

## 2017-06-18 DIAGNOSIS — Z961 Presence of intraocular lens: Secondary | ICD-10-CM | POA: Diagnosis not present

## 2017-06-18 DIAGNOSIS — H40013 Open angle with borderline findings, low risk, bilateral: Secondary | ICD-10-CM | POA: Diagnosis not present

## 2017-11-10 DIAGNOSIS — Z9119 Patient's noncompliance with other medical treatment and regimen: Secondary | ICD-10-CM | POA: Diagnosis not present

## 2017-11-10 DIAGNOSIS — J3089 Other allergic rhinitis: Secondary | ICD-10-CM | POA: Diagnosis not present

## 2017-11-10 DIAGNOSIS — H1045 Other chronic allergic conjunctivitis: Secondary | ICD-10-CM | POA: Diagnosis not present

## 2017-11-10 DIAGNOSIS — J4530 Mild persistent asthma, uncomplicated: Secondary | ICD-10-CM | POA: Diagnosis not present

## 2017-12-17 DIAGNOSIS — M810 Age-related osteoporosis without current pathological fracture: Secondary | ICD-10-CM | POA: Diagnosis not present

## 2017-12-17 DIAGNOSIS — R7301 Impaired fasting glucose: Secondary | ICD-10-CM | POA: Diagnosis not present

## 2017-12-17 DIAGNOSIS — E038 Other specified hypothyroidism: Secondary | ICD-10-CM | POA: Diagnosis not present

## 2017-12-17 DIAGNOSIS — I10 Essential (primary) hypertension: Secondary | ICD-10-CM | POA: Diagnosis not present

## 2017-12-17 DIAGNOSIS — E781 Pure hyperglyceridemia: Secondary | ICD-10-CM | POA: Diagnosis not present

## 2017-12-18 DIAGNOSIS — Z1231 Encounter for screening mammogram for malignant neoplasm of breast: Secondary | ICD-10-CM | POA: Diagnosis not present

## 2017-12-24 DIAGNOSIS — I10 Essential (primary) hypertension: Secondary | ICD-10-CM | POA: Diagnosis not present

## 2017-12-24 DIAGNOSIS — N183 Chronic kidney disease, stage 3 (moderate): Secondary | ICD-10-CM | POA: Diagnosis not present

## 2017-12-24 DIAGNOSIS — Z0000 Encounter for general adult medical examination without abnormal findings: Secondary | ICD-10-CM | POA: Diagnosis not present

## 2017-12-24 DIAGNOSIS — R7301 Impaired fasting glucose: Secondary | ICD-10-CM | POA: Diagnosis not present

## 2017-12-24 DIAGNOSIS — Z6823 Body mass index (BMI) 23.0-23.9, adult: Secondary | ICD-10-CM | POA: Diagnosis not present

## 2017-12-24 DIAGNOSIS — M810 Age-related osteoporosis without current pathological fracture: Secondary | ICD-10-CM | POA: Diagnosis not present

## 2017-12-24 DIAGNOSIS — K219 Gastro-esophageal reflux disease without esophagitis: Secondary | ICD-10-CM | POA: Diagnosis not present

## 2017-12-24 DIAGNOSIS — R82998 Other abnormal findings in urine: Secondary | ICD-10-CM | POA: Diagnosis not present

## 2017-12-24 DIAGNOSIS — Z1389 Encounter for screening for other disorder: Secondary | ICD-10-CM | POA: Diagnosis not present

## 2017-12-24 DIAGNOSIS — E7849 Other hyperlipidemia: Secondary | ICD-10-CM | POA: Diagnosis not present

## 2017-12-24 DIAGNOSIS — K551 Chronic vascular disorders of intestine: Secondary | ICD-10-CM | POA: Diagnosis not present

## 2017-12-24 DIAGNOSIS — E038 Other specified hypothyroidism: Secondary | ICD-10-CM | POA: Diagnosis not present

## 2017-12-24 DIAGNOSIS — Z23 Encounter for immunization: Secondary | ICD-10-CM | POA: Diagnosis not present

## 2017-12-24 DIAGNOSIS — J45998 Other asthma: Secondary | ICD-10-CM | POA: Diagnosis not present

## 2018-01-01 DIAGNOSIS — Z808 Family history of malignant neoplasm of other organs or systems: Secondary | ICD-10-CM | POA: Diagnosis not present

## 2018-01-01 DIAGNOSIS — L720 Epidermal cyst: Secondary | ICD-10-CM | POA: Diagnosis not present

## 2018-01-01 DIAGNOSIS — L814 Other melanin hyperpigmentation: Secondary | ICD-10-CM | POA: Diagnosis not present

## 2018-01-01 DIAGNOSIS — L821 Other seborrheic keratosis: Secondary | ICD-10-CM | POA: Diagnosis not present

## 2018-02-18 ENCOUNTER — Encounter (HOSPITAL_COMMUNITY): Payer: Medicare Other

## 2018-02-18 ENCOUNTER — Ambulatory Visit: Payer: Medicare Other | Admitting: Family

## 2018-05-15 ENCOUNTER — Ambulatory Visit: Payer: Medicare Other | Admitting: Family

## 2018-05-15 ENCOUNTER — Encounter (HOSPITAL_COMMUNITY): Payer: Medicare Other

## 2018-06-08 ENCOUNTER — Encounter (INDEPENDENT_AMBULATORY_CARE_PROVIDER_SITE_OTHER): Payer: Self-pay

## 2018-07-21 DIAGNOSIS — H40013 Open angle with borderline findings, low risk, bilateral: Secondary | ICD-10-CM | POA: Diagnosis not present

## 2018-07-21 DIAGNOSIS — H353122 Nonexudative age-related macular degeneration, left eye, intermediate dry stage: Secondary | ICD-10-CM | POA: Diagnosis not present

## 2018-07-21 DIAGNOSIS — Z961 Presence of intraocular lens: Secondary | ICD-10-CM | POA: Diagnosis not present

## 2018-12-21 DIAGNOSIS — M810 Age-related osteoporosis without current pathological fracture: Secondary | ICD-10-CM | POA: Diagnosis not present

## 2018-12-21 DIAGNOSIS — E7849 Other hyperlipidemia: Secondary | ICD-10-CM | POA: Diagnosis not present

## 2018-12-21 DIAGNOSIS — R7301 Impaired fasting glucose: Secondary | ICD-10-CM | POA: Diagnosis not present

## 2018-12-21 DIAGNOSIS — E038 Other specified hypothyroidism: Secondary | ICD-10-CM | POA: Diagnosis not present

## 2018-12-28 DIAGNOSIS — E785 Hyperlipidemia, unspecified: Secondary | ICD-10-CM | POA: Diagnosis not present

## 2018-12-28 DIAGNOSIS — J45909 Unspecified asthma, uncomplicated: Secondary | ICD-10-CM | POA: Diagnosis not present

## 2018-12-28 DIAGNOSIS — I10 Essential (primary) hypertension: Secondary | ICD-10-CM | POA: Diagnosis not present

## 2018-12-28 DIAGNOSIS — F3342 Major depressive disorder, recurrent, in full remission: Secondary | ICD-10-CM | POA: Diagnosis not present

## 2018-12-28 DIAGNOSIS — R7301 Impaired fasting glucose: Secondary | ICD-10-CM | POA: Diagnosis not present

## 2018-12-28 DIAGNOSIS — Z1339 Encounter for screening examination for other mental health and behavioral disorders: Secondary | ICD-10-CM | POA: Diagnosis not present

## 2018-12-28 DIAGNOSIS — Z0000 Encounter for general adult medical examination without abnormal findings: Secondary | ICD-10-CM | POA: Diagnosis not present

## 2018-12-28 DIAGNOSIS — K219 Gastro-esophageal reflux disease without esophagitis: Secondary | ICD-10-CM | POA: Diagnosis not present

## 2018-12-28 DIAGNOSIS — M810 Age-related osteoporosis without current pathological fracture: Secondary | ICD-10-CM | POA: Diagnosis not present

## 2018-12-28 DIAGNOSIS — Z23 Encounter for immunization: Secondary | ICD-10-CM | POA: Diagnosis not present

## 2018-12-28 DIAGNOSIS — E559 Vitamin D deficiency, unspecified: Secondary | ICD-10-CM | POA: Diagnosis not present

## 2018-12-28 DIAGNOSIS — Z1331 Encounter for screening for depression: Secondary | ICD-10-CM | POA: Diagnosis not present

## 2018-12-28 DIAGNOSIS — M19041 Primary osteoarthritis, right hand: Secondary | ICD-10-CM | POA: Diagnosis not present

## 2018-12-28 DIAGNOSIS — N183 Chronic kidney disease, stage 3 unspecified: Secondary | ICD-10-CM | POA: Diagnosis not present

## 2018-12-28 DIAGNOSIS — E039 Hypothyroidism, unspecified: Secondary | ICD-10-CM | POA: Diagnosis not present

## 2018-12-28 DIAGNOSIS — R82998 Other abnormal findings in urine: Secondary | ICD-10-CM | POA: Diagnosis not present

## 2019-01-30 ENCOUNTER — Emergency Department (HOSPITAL_COMMUNITY): Payer: Medicare Other | Admitting: Certified Registered Nurse Anesthetist

## 2019-01-30 ENCOUNTER — Other Ambulatory Visit: Payer: Self-pay

## 2019-01-30 ENCOUNTER — Encounter (HOSPITAL_COMMUNITY)
Admission: EM | Disposition: A | Discharge status: Home / Self Care | Payer: Self-pay | Source: Home / Self Care | Attending: Emergency Medicine

## 2019-01-30 ENCOUNTER — Emergency Department (HOSPITAL_BASED_OUTPATIENT_CLINIC_OR_DEPARTMENT_OTHER)
Admission: EM | Admit: 2019-01-30 | Discharge: 2019-01-30 | Disposition: A | Discharge status: Home / Self Care | Payer: Medicare Other | Attending: Emergency Medicine | Admitting: Emergency Medicine

## 2019-01-30 ENCOUNTER — Encounter (HOSPITAL_BASED_OUTPATIENT_CLINIC_OR_DEPARTMENT_OTHER): Payer: Self-pay | Admitting: Emergency Medicine

## 2019-01-30 DIAGNOSIS — Y929 Unspecified place or not applicable: Secondary | ICD-10-CM | POA: Diagnosis not present

## 2019-01-30 DIAGNOSIS — J45909 Unspecified asthma, uncomplicated: Secondary | ICD-10-CM | POA: Diagnosis not present

## 2019-01-30 DIAGNOSIS — Z79899 Other long term (current) drug therapy: Secondary | ICD-10-CM | POA: Diagnosis not present

## 2019-01-30 DIAGNOSIS — Y999 Unspecified external cause status: Secondary | ICD-10-CM | POA: Diagnosis not present

## 2019-01-30 DIAGNOSIS — K449 Diaphragmatic hernia without obstruction or gangrene: Secondary | ICD-10-CM

## 2019-01-30 DIAGNOSIS — K222 Esophageal obstruction: Secondary | ICD-10-CM | POA: Diagnosis not present

## 2019-01-30 DIAGNOSIS — K219 Gastro-esophageal reflux disease without esophagitis: Secondary | ICD-10-CM

## 2019-01-30 DIAGNOSIS — Y9389 Activity, other specified: Secondary | ICD-10-CM | POA: Diagnosis not present

## 2019-01-30 DIAGNOSIS — J449 Chronic obstructive pulmonary disease, unspecified: Secondary | ICD-10-CM | POA: Insufficient documentation

## 2019-01-30 DIAGNOSIS — R000 Tachycardia, unspecified: Secondary | ICD-10-CM | POA: Diagnosis not present

## 2019-01-30 DIAGNOSIS — I10 Essential (primary) hypertension: Secondary | ICD-10-CM | POA: Insufficient documentation

## 2019-01-30 DIAGNOSIS — Z7982 Long term (current) use of aspirin: Secondary | ICD-10-CM | POA: Insufficient documentation

## 2019-01-30 DIAGNOSIS — T18128A Food in esophagus causing other injury, initial encounter: Secondary | ICD-10-CM | POA: Diagnosis not present

## 2019-01-30 DIAGNOSIS — E039 Hypothyroidism, unspecified: Secondary | ICD-10-CM | POA: Diagnosis not present

## 2019-01-30 DIAGNOSIS — K317 Polyp of stomach and duodenum: Secondary | ICD-10-CM

## 2019-01-30 DIAGNOSIS — X58XXXA Exposure to other specified factors, initial encounter: Secondary | ICD-10-CM | POA: Diagnosis not present

## 2019-01-30 DIAGNOSIS — Z20828 Contact with and (suspected) exposure to other viral communicable diseases: Secondary | ICD-10-CM | POA: Insufficient documentation

## 2019-01-30 DIAGNOSIS — Z03818 Encounter for observation for suspected exposure to other biological agents ruled out: Secondary | ICD-10-CM | POA: Diagnosis not present

## 2019-01-30 HISTORY — PX: FOREIGN BODY REMOVAL: SHX962

## 2019-01-30 HISTORY — PX: ESOPHAGOGASTRODUODENOSCOPY: SHX5428

## 2019-01-30 LAB — CBC WITH DIFFERENTIAL/PLATELET
Abs Immature Granulocytes: 0.01 10*3/uL (ref 0.00–0.07)
Basophils Absolute: 0.1 10*3/uL (ref 0.0–0.1)
Basophils Relative: 1 %
Eosinophils Absolute: 0.3 10*3/uL (ref 0.0–0.5)
Eosinophils Relative: 5 %
HCT: 42.7 % (ref 36.0–46.0)
Hemoglobin: 14.5 g/dL (ref 12.0–15.0)
Immature Granulocytes: 0 %
Lymphocytes Relative: 25 %
Lymphs Abs: 1.6 10*3/uL (ref 0.7–4.0)
MCH: 32.3 pg (ref 26.0–34.0)
MCHC: 34 g/dL (ref 30.0–36.0)
MCV: 95.1 fL (ref 80.0–100.0)
Monocytes Absolute: 0.5 10*3/uL (ref 0.1–1.0)
Monocytes Relative: 8 %
Neutro Abs: 3.9 10*3/uL (ref 1.7–7.7)
Neutrophils Relative %: 61 %
Platelets: 252 10*3/uL (ref 150–400)
RBC: 4.49 MIL/uL (ref 3.87–5.11)
RDW: 11.9 % (ref 11.5–15.5)
WBC: 6.4 10*3/uL (ref 4.0–10.5)
nRBC: 0 % (ref 0.0–0.2)

## 2019-01-30 LAB — COMPREHENSIVE METABOLIC PANEL
ALT: 16 U/L (ref 0–44)
AST: 21 U/L (ref 15–41)
Albumin: 4.1 g/dL (ref 3.5–5.0)
Alkaline Phosphatase: 83 U/L (ref 38–126)
Anion gap: 10 (ref 5–15)
BUN: 15 mg/dL (ref 8–23)
CO2: 26 mmol/L (ref 22–32)
Calcium: 9.7 mg/dL (ref 8.9–10.3)
Chloride: 98 mmol/L (ref 98–111)
Creatinine, Ser: 0.91 mg/dL (ref 0.44–1.00)
GFR calc Af Amer: >60 mL/min (ref >60)
GFR calc non Af Amer: 56 mL/min — ABNORMAL LOW (ref >60)
Glucose, Bld: 110 mg/dL — ABNORMAL HIGH (ref 70–99)
Potassium: 3.9 mmol/L (ref 3.5–5.1)
Sodium: 134 mmol/L — ABNORMAL LOW (ref 135–145)
Total Bilirubin: 1 mg/dL (ref 0.3–1.2)
Total Protein: 7.3 g/dL (ref 6.5–8.1)

## 2019-01-30 LAB — RESPIRATORY PANEL BY RT PCR (FLU A&B, COVID)
Influenza A by PCR: NEGATIVE
Influenza B by PCR: NEGATIVE
SARS Coronavirus 2 by RT PCR: NEGATIVE

## 2019-01-30 LAB — SARS CORONAVIRUS 2 AG (30 MIN TAT): SARS Coronavirus 2 Ag: NEGATIVE

## 2019-01-30 SURGERY — EGD (ESOPHAGOGASTRODUODENOSCOPY)
Anesthesia: Monitor Anesthesia Care | Laterality: Left

## 2019-01-30 MED ORDER — GLUCAGON HCL RDNA (DIAGNOSTIC) 1 MG IJ SOLR
0.5000 mg | Freq: Once | INTRAMUSCULAR | Status: AC
  Administered 2019-01-30: 15:00:00 0.5 mg via INTRAVENOUS

## 2019-01-30 MED ORDER — SUCRALFATE 1 GM/10ML PO SUSP: 1.0000 g | Freq: Four times a day (QID) | ORAL | 1 refills | Status: DC

## 2019-01-30 MED ORDER — PROPOFOL 500 MG/50ML IV EMUL
INTRAVENOUS | Status: DC | PRN
  Administered 2019-01-30 (×2): 25 mg via INTRAVENOUS

## 2019-01-30 MED ORDER — SODIUM CHLORIDE 0.9 % IV SOLN: INTRAVENOUS | Status: DC

## 2019-01-30 MED ORDER — PROPOFOL 500 MG/50ML IV EMUL
INTRAVENOUS | Status: DC | PRN
  Administered 2019-01-30: 125 ug/kg/min via INTRAVENOUS

## 2019-01-30 MED ORDER — SUCRALFATE 1 GM/10ML PO SUSP
1.0000 g | Freq: Once | ORAL | Status: AC
  Administered 2019-01-30: 20:00:00 1 g via ORAL
  Filled 2019-01-30: qty 10

## 2019-01-30 MED ORDER — PROPOFOL 10 MG/ML IV BOLUS
INTRAVENOUS | Status: AC
  Filled 2019-01-30: qty 40

## 2019-01-30 MED ORDER — GLUCAGON HCL RDNA (DIAGNOSTIC) 1 MG IJ SOLR
INTRAMUSCULAR | Status: AC
  Filled 2019-01-30: qty 1

## 2019-01-30 MED ORDER — LACTATED RINGERS IV SOLN: INTRAVENOUS | Status: DC | PRN

## 2019-01-30 NOTE — Transfer of Care (Signed)
Immediate Anesthesia Transfer of Care Note  Patient: Megan Foster  Procedure(s) Performed: ESOPHAGOGASTRODUODENOSCOPY (EGD) (Left ) FOREIGN BODY REMOVAL  Patient Location: Endoscopy Unit  Anesthesia Type:MAC  Level of Consciousness: drowsy and patient cooperative  Airway & Oxygen Therapy: Patient Spontanous Breathing and Patient connected to face mask oxygen  Post-op Assessment: Report given to RN and Post -op Vital signs reviewed and stable  Post vital signs: Reviewed and stable  Last Vitals:  Vitals Value Taken Time  BP    Temp    Pulse    Resp    SpO2      Last Pain:  Vitals:   01/30/19 1800  TempSrc: Oral  PainSc: 0-No pain         Complications: No apparent anesthesia complications

## 2019-01-30 NOTE — Discharge Instructions (Signed)
Use the sucralfate suspension before meals and at bedtime.  Do a full liquid diet for the next 2 days and then soft foods.  Restart your Protonix.

## 2019-01-30 NOTE — Op Note (Signed)
[facility] Patient Name: Megan Foster Procedure Date: 01/30/2019 MRN: [id number] Attending MD: Gerrit Heck , MD Date of Birth: Sep 06, 1927 CSN: [id number] Age: 85 Admit Type: Outpatient Procedure:                Upper GI endoscopy Indications:              Dysphagia, Foreign body in the esophagus                           85 yo female with a known history of hiatal hernia                            and GERD presents with acute food impaction after                            eating chicken last evening. Not tolerating any PO                            intake. Providers:                Gerrit Heck, MD, Ashley Jacobs, RN, Janeece Agee,                            Technician, Laverda Sorenson, Technician, Herbie Drape, CRNA Referring MD:              Medicines:                Monitored Anesthesia Care Complications:            No immediate complications. Estimated Blood Loss:     Estimated blood loss was minimal. Procedure:                Pre-Anesthesia Assessment:                           - Prior to the procedure, a History and Physical                            was performed, and patient medications and                            allergies were reviewed. The patient's tolerance of                            previous anesthesia was also reviewed. The risks                            and benefits of the procedure and the sedation                            options and risks were discussed with the patient.  All questions were answered, and informed consent                            was obtained. Prior Anticoagulants: The patient has                            taken no previous anticoagulant or antiplatelet                            agents. ASA Grade Assessment: III - A patient with                            severe systemic disease. After reviewing the risks                            and benefits, the patient was  deemed in                            satisfactory condition to undergo the procedure.                           After obtaining informed consent, the endoscope was                            passed under direct vision. Throughout the                            procedure, the patient's blood pressure, pulse, and                            oxygen saturations were monitored continuously. The                            [id number] Olympus gastroscope was                            introduced through the mouth, and advanced to the                            second part of duodenum. The upper GI endoscopy was                            accomplished without difficulty. The patient                            tolerated the procedure well. Scope In: Scope Out: Findings:      Food bolus was found in the lower third of the esophagus. This was       successfully advanced into the stomach using air insufflation and gentle       endoscopic pressure. After clearing food bolus, there was stasis injury       and erythema noted in the distal esophagus at the site of recent food       impaction. No perforation noted.  One benign-appearing, intrinsic moderate stenosis was found 35 cm from       the incisors, immediately distal to the impaction site. The stenosis was       traversed, but 2 small mucosal rents noted with endoscope advancement,       consistent with dilation of the stricture with endoscope alone.       Estimated blood loss was minimal.      A 2 cm sliding type hiatal hernia was present.      A few small sessile polyps with no bleeding and no stigmata of recent       bleeding were found in the gastric fundus and in the gastric body.      The gastric antrum and pylorus were normal.      The duodenal bulb, first portion of the duodenum and second portion of       the duodenum were normal. Impression:               - Food in the lower third of the esophagus.                           -  Benign-appearing esophageal stenosis.                           - 2 cm hiatal hernia.                           - A few gastric polyps.                           - Normal antrum and pylorus.                           - Normal duodenal bulb, first portion of the                            duodenum and second portion of the duodenum.                           - No specimens collected. Moderate Sedation:      Not Applicable - Patient had care per Anesthesia. Recommendation:           - Return patient to ER for possible discharge same                            day.                           - Full liquid diet for 2 days, then if tolerating                            without issue, can progess to soft foods. Do NOT                            advance beyond soft foods until follow-up                            appointment  in the GI clinic.                           - Continue present medications.                           - Use sucralfate suspension 1 gram PO QID for 2                            weeks.                           - Follow-up with Dr. Bryan Lemma in the [place] clinic in 3 weeks.                           - Repeat upper endoscopy in 6 weeks to check                            healing and for retreatment/balloon dilation of the                            known stricture.                           - Resume Protonix 40 mg PO BID as prescribed. Procedure Code(s):        --- Professional ---                           [phone], Esophagogastroduodenoscopy, flexible,                            transoral; diagnostic, including collection of                            specimen(s) by brushing or washing, when performed                            (separate procedure) Diagnosis Code(s):        --- Professional ---                           [id number], Food in esophagus causing other injury,                            initial encounter                            K22.2, Esophageal obstruction                           K44.9, Diaphragmatic hernia without obstruction or                            gangrene  K31.7, Polyp of stomach and duodenum                           R13.10, Dysphagia, unspecified                           T18.108A, Unspecified foreign body in esophagus                            causing other injury, initial encounter CPT copyright 2019 [employer]. All rights reserved. The codes documented in this report are preliminary and upon coder review may  be revised to meet current compliance requirements. Gerrit Heck, MD 01/30/2019 6:44:55 PM Number of Addenda: 0

## 2019-01-30 NOTE — ED Provider Notes (Signed)
Patient transferred from [facility] due to food bolus impaction after she ate a chicken nuggets.  She is still having sensation of something in her throat and is still regurgitating.  Patient was taken to endoscopy suite for removal of food bolus.  7:44 PM Food bolus was removed and patient is back from endoscopy suite and feeling well.  She is awake and alert and appears to be stable for discharge.  Patient was given 1 dose of sucralfate suspension here because she did not think she be able to pick it up from her pharmacy Kavin Leech, MD 01/30/19 1945

## 2019-01-30 NOTE — ED Notes (Signed)
Left with [facility] at this time. 

## 2019-01-30 NOTE — Interval H&P Note (Signed)
History and Physical Interval Note:  01/30/2019 6:11 PM  [place]  has presented today for surgery, with the diagnosis of Food impaction, dysphagia.  The various methods of treatment have been discussed with the patient and family. After consideration of risks, benefits and other options for treatment, the patient has consented to  Procedure(s): ESOPHAGOGASTRODUODENOSCOPY (EGD) (Left) as a surgical intervention.  The patient's history has been reviewed, patient examined, no change in status, stable for surgery.  I have reviewed the patient's chart and labs.  Questions were answered to the patient's satisfaction.     Dominic Pea Gresham Caetano

## 2019-01-30 NOTE — Anesthesia Preprocedure Evaluation (Signed)
Anesthesia Evaluation  Patient identified by MRN, date of birth, ID band Patient awake    Reviewed: Allergy & Precautions, H&P , NPO status , Patient's Chart, lab work & pertinent test results  Airway Mallampati: II   Neck ROM: full    Dental  (+) Edentulous Upper, Edentulous Lower   Pulmonary asthma ,    breath sounds clear to auscultation       Cardiovascular hypertension, + Peripheral Vascular Disease   Rhythm:regular Rate:Normal     Neuro/Psych  Headaches, PSYCHIATRIC DISORDERS Depression  Neuromuscular disease    GI/Hepatic hiatal hernia, GERD  ,Food impaction. Esophageal stricture   Endo/Other  Hypothyroidism   Renal/GU      Musculoskeletal  (+) Arthritis ,   Abdominal   Peds  Hematology   Anesthesia Other Findings   Reproductive/Obstetrics                             Anesthesia Physical Anesthesia Plan  ASA: III  Anesthesia Plan: MAC   Post-op Pain Management:    Induction: Intravenous  PONV Risk Score and Plan: 2 and Ondansetron, Propofol infusion and Treatment may vary due to age or medical condition  Airway Management Planned: Nasal Cannula  Additional Equipment:   Intra-op Plan:   Post-operative Plan:   Informed Consent: I have reviewed the patients History and Physical, chart, labs and discussed the procedure including the risks, benefits and alternatives for the proposed anesthesia with the patient or authorized representative who has indicated his/her understanding and acceptance.       Plan Discussed with: CRNA, Anesthesiologist and Surgeon  Anesthesia Plan Comments:         Anesthesia Quick Evaluation

## 2019-01-30 NOTE — ED Provider Notes (Signed)
[place] EMERGENCY DEPARTMENT Provider Note   CSN: [id number] Arrival date & time: 01/30/19  1410     History Chief Complaint  Patient presents with  . Foreign Body    Megan Foster is a 85 y.o. female with history of esophageal dysmotility, hiatal hernia, hypertension who presents with food impaction that began last night after swallowing a chicken tender.  Patient states she does not have any teeth and did not showed up well enough.  She denies difficulty breathing, but has been unable to tolerate liquids or food since then.  She reports she just spits up fluid when she tries to drink.  She denies any pain, but has a foreign body sensation in her upper abdomen and lower chest.  She denies any nausea or vomiting except when trying to drink fluids.  HPI     Past Medical History:  Diagnosis Date  . Arthritis   . Asthma   . Depression   . Elevated LFTs   . Esophageal dysmotility   . GERD (gastroesophageal reflux disease)   . Headache(784.0)   . Hiatal hernia   . Hyperplastic polyps of stomach   . Hypertension   . Hypothyroid   . Irritable bowel syndrome   . Nausea & vomiting 04/02/2012  . Osteoporosis   . Shingles   . Sinusitis   . Tachycardia   . Vitamin D deficiency     Patient Active Problem List   Diagnosis Date Noted  . Right groin pain 05/28/2013  . Pelvic pain in female 05/28/2013  . SMA stenosis 11/17/2012  . Chronic mesenteric ischemia ([place]) 05/12/2012  . 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 Palpitations 12/18/2010  . HTN (hypertension) 12/18/2010  . THYROID DISORDER 04/21/2007  . HYPERLIPIDEMIA 04/21/2007  . ASTHMATIC BRONCHITIS, ACUTE 04/21/2007  . GERD 04/21/2007  . IBS 04/21/2007  . ALLERGY 04/21/2007  . SCHATZKI'S RING 09/26/2006  . ESOPHAGEAL MOTILITY DISORDER 09/26/2006  . HIATAL HERNIA 09/26/2006  . GASTRIC POLYP 03/04/2006    Past Surgical History:  Procedure Laterality Date  . AORTOGRAM  04/16/12  .  COLONOSCOPY  2008   normal  . ESOPHAGOGASTRODUODENOSCOPY  2014   multiple   . MULTIPLE TOOTH EXTRACTIONS    . TOTAL ABDOMINAL HYSTERECTOMY  1999     OB History   No obstetric history on file.     Family History  Problem Relation Age of Onset  . Hypertension Mother   . Heart attack Mother   . Heart attack Father 50  . Hypertension Father   . Peripheral vascular disease Father   . Heart failure Sister   . Cancer Sister   . Hypertension Brother   . Cancer Brother   . Hypertension Brother   . Breast cancer Sister   . Ovarian cancer Other        mat cousin  . Prostate cancer Maternal Uncle   . Colon polyps Sister        siblings  . Colon cancer Other        Maternal Aunt x3 Maternal Uncle x 2  . Esophageal cancer Brother        died at 53  . Kidney disease Brother     Social History   Tobacco Use  . Smoking status: Never Smoker  . Smokeless tobacco: Never Used  Substance Use Topics  . Alcohol use: No    Alcohol/week: 0.0 standard drinks    Comment: <1 day  . Drug use: No  Home Medications Prior to Admission medications   Medication Sig Start Date End Date Taking? Authorizing Provider  acetaminophen (TYLENOL) 500 MG tablet Take 500 mg by mouth every 6 (six) hours as needed for pain.    [provider]  Albuterol Sulfate (PROAIR HFA IN) Inhale 2 puffs into the lungs every 6 (six) hours as needed (for shortness of breath).     [provider]  aspirin 81 MG tablet Take 81 mg by mouth daily.    [provider]  atorvastatin (LIPITOR) 20 MG tablet Take 20 mg by mouth daily.    [provider]  cetirizine-pseudoephedrine (ZYRTEC-D) 5-120 MG per tablet Take 1 tablet by mouth daily.     [provider]  diphenhydrAMINE (BENADRYL) 25 mg capsule Take 25 mg by mouth every 6 (six) hours as needed.    [provider]  fluticasone (FLONASE) 50 MCG/ACT nasal spray Place 2 sprays into the nose daily. 12/18/10 04/14/12  [place],  Peter M, MD  fluticasone furoate-vilanterol (BREO ELLIPTA) 200-25 MCG/INH AEPB Inhale 1 puff into the lungs daily.    [provider]  levothyroxine (SYNTHROID, LEVOTHROID) 88 MCG tablet Take 100 mcg by mouth daily before breakfast.     [provider]  metoprolol succinate (TOPROL-XL) 50 MG 24 hr tablet TAKE ONE TABLET BY MOUTH ONE TIME DAILY 09/02/12   [place], Peter M, MD  montelukast (SINGULAIR) 10 MG tablet Take 10 mg by mouth at bedtime.    [provider]  Multiple Vitamin (MULTIVITAMIN WITH MINERALS) TABS Take 1 tablet by mouth daily.    [provider]  Multiple Vitamins-Minerals (PRESERVISION AREDS 2 PO) Take by mouth.    [provider]  pantoprazole (PROTONIX) 40 MG tablet Take 40 mg by mouth 2 (two) times daily.    [provider]  Vitamin D, Ergocalciferol, (DRISDOL) 50000 UNITS CAPS Take 50,000 Units by mouth every 7 (seven) days.    [provider]    Allergies    Patient has no known allergies.  Review of Systems   Review of Systems  Constitutional: Negative for chills and fever.  HENT: Negative for facial swelling and sore throat.   Respiratory: Negative for shortness of breath.   Cardiovascular: Negative for chest pain (no pain, FB sensation).  Gastrointestinal: Positive for vomiting. Negative for abdominal pain and nausea.  Genitourinary: Negative for dysuria.  Musculoskeletal: Negative for back pain.  Skin: Negative for rash and wound.  Neurological: Negative for headaches.  Psychiatric/Behavioral: The patient is not nervous/anxious.     Physical Exam Updated Vital Signs BP (!) 154/78   Pulse (!) 111   Temp 98.2 F (36.8 C) (Oral)   Resp 18   Ht 5\' 5"  (1.651 m)   Wt 61.2 kg   SpO2 100%   BMI 22.47 kg/m   Physical Exam Vitals and nursing note reviewed.  Constitutional:      General: She is not in acute distress.    Appearance: She is well-developed. She is not diaphoretic.  HENT:     Head:  Normocephalic and atraumatic.     Mouth/Throat:     Pharynx: No oropharyngeal exudate.  Eyes:     General: No scleral icterus.       Right eye: No discharge.        Left eye: No discharge.     Conjunctiva/sclera: Conjunctivae normal.     Pupils: Pupils are equal, round, and reactive to light.  Neck:     Thyroid: No  thyromegaly.  Cardiovascular:     Rate and Rhythm: Regular rhythm. Tachycardia present.     Heart sounds: Normal heart sounds. No murmur. No friction rub. No gallop.      Comments: Patient states that her heart rate is always a little fast Pulmonary:     Effort: Pulmonary effort is normal. No respiratory distress.     Breath sounds: Normal breath sounds. No stridor. No wheezing or rales.  Abdominal:     General: Bowel sounds are normal. There is no distension.     Palpations: Abdomen is soft.     Tenderness: There is no abdominal tenderness. There is no guarding or rebound.  Musculoskeletal:     Cervical back: Normal range of motion and neck supple.  Lymphadenopathy:     Cervical: No cervical adenopathy.  Skin:    General: Skin is warm and dry.     Coloration: Skin is not pale.     Findings: No rash.  Neurological:     Mental Status: She is alert.     Coordination: Coordination normal.     ED Results / Procedures / Treatments   Labs (all labs ordered are listed, but only abnormal results are displayed) Labs Reviewed  COMPREHENSIVE METABOLIC PANEL - Abnormal; Notable for the following components:      Result Value   Sodium 134 (*)    Glucose, Bld 110 (*)    GFR calc non Af Amer 56 (*)    All other components within normal limits  SARS CORONAVIRUS 2 AG (30 MIN TAT)  RESPIRATORY PANEL BY RT PCR (FLU A&B, COVID)  CBC WITH DIFFERENTIAL/PLATELET    EKG EKG Interpretation  Date/Time:  Saturday January 30 2019 14:30:48 EST Ventricular Rate:  108 PR Interval:    QRS Duration: 80 QT Interval:  305 QTC Calculation: 409 R Axis:   -5 Text  Interpretation: Sinus tachycardia Low voltage, precordial leads Confirmed by Quintella Reichert [phone]) on 01/30/2019 2:35:46 PM   Radiology No results found.  Procedures Procedures (including critical care time)  Medications Ordered in ED Medications  glucagon (human recombinant) (GLUCAGEN) 1 MG injection (  Not Given 01/30/19 1530)  0.9 %  sodium chloride infusion (has no administration in time range)  glucagon (human recombinant) (GLUCAGEN) injection 0.5 mg (0.5 mg Intravenous Given 01/30/19 1524)    ED Course  I have reviewed the triage vital signs and the nursing notes.  Pertinent labs & imaging results that were available during my care of the patient were reviewed by me and considered in my medical decision making (see chart for details).    MDM Rules/Calculators/A&P                      Patient with reported food impaction since last night.  She is unable to tolerate oral fluids or eat.  She reports she spits it right back up.  She was eating chicken nuggets last night. Patient has been in no acute distress and denies any significant pain or difficulty breathing. I discussed patient case with gastroenterologist, Dr. Helyn Numbers, who advised trial of glucagon, but transfer to [facility] for endoscopy and disimpaction.  I discussed patient case with ED physician at [facility], Dr. Maryan Rued, who accepts patient for transfer.  I appreciate the above consultants for their assistance with the patient.  Patient also guided by my attending, Dr. Ralene Bathe, who guided the patient's management and agrees with plan.  Final Clinical Impression(s) / ED Diagnoses Final diagnoses:  Food impaction of esophagus, initial encounter    Rx / DC Orders ED Discharge Orders    None       Frederica Kuster, PA-C 01/30/19 1713    Quintella Reichert, MD 01/31/19 [phone]

## 2019-01-30 NOTE — ED Notes (Signed)
Endoscopy here for patient transfer.

## 2019-01-30 NOTE — H&P (Signed)
Consultation  Referring Provider:     Caryl Ada ([facility]) Primary Care Physician:  Marton Redwood, MD Primary Gastroenterologist:     Dr. Delfin Edis, MD    Reason for Consultation:    Food impaction, dysphagia         HPI:   Megan Foster is a 85 y.o. female with a history of hypertension, hiatal hernia, GERD, reported esophageal dysmotility, presents earlier today to the [facility] with food impaction.  Was otherwise in her usual state of health yesterday, eating chicken for dinner last night at approximately 1900 and felt food getting stuck, pointing to her lower sternal border.  Has not been able to tolerate any p.o. intake, fluids since that point.  Symptoms did not improve overnight or this morning, so she presented to ER late in the afternoon.  Otherwise afebrile.  Aside from discomfort in the lower chest, no frank chest pain, SOB.  Evaluation in the ER notable for normal CMP, CBC, Covid negative.  Aside from ASA 81 mg, no antiplatelet therapy or systemic anticoagulation.  Takes Protonix 40 mg p.o. twice daily for reflux.  Endoscopic history: -EGD (03/2012, Dr. Olevia Perches): 2 cm HH, mild gastritis -EGD (02/2008, Dr. Olevia Perches): Normal esophagus, empiric dilation with 16 mm Savary dilator without mucosal rent -EGD (a 02/2006, Dr. Olevia Perches): Normal esophagus, 15 mm gastric polyp -EGD (02/2001, Dr. Olevia Perches): Normal  -UGI series (09/2006): Small hiatal hernia, mild Schatzki's ring, mild esophageal dysmotility noted  Past Medical History:  Diagnosis Date  . Arthritis   . Asthma   . Depression   . Elevated LFTs   . Esophageal dysmotility   . GERD (gastroesophageal reflux disease)   . Headache(784.0)   . Hiatal hernia   . Hyperplastic polyps of stomach   . Hypertension   . Hypothyroid   . Irritable bowel syndrome   . Nausea & vomiting 04/02/2012  . Osteoporosis   . Shingles   . Sinusitis   . Tachycardia   . Vitamin D deficiency     Past  Surgical History:  Procedure Laterality Date  . AORTOGRAM  04/16/12  . COLONOSCOPY  2008   normal  . ESOPHAGOGASTRODUODENOSCOPY  2014   multiple   . MULTIPLE TOOTH EXTRACTIONS    . TOTAL ABDOMINAL HYSTERECTOMY  1999    Family History  Problem Relation Age of Onset  . Hypertension Mother   . Heart attack Mother   . Heart attack Father 40  . Hypertension Father   . Peripheral vascular disease Father   . Heart failure Sister   . Cancer Sister   . Hypertension Brother   . Cancer Brother   . Hypertension Brother   . Breast cancer Sister   . Ovarian cancer Other        mat cousin  . Prostate cancer Maternal Uncle   . Colon polyps Sister        siblings  . Colon cancer Other        Maternal Aunt x3 Maternal Uncle x 2  . Esophageal cancer Brother        died at 31  . Kidney disease Brother      Social History   Tobacco Use  . Smoking status: Never Smoker  . Smokeless tobacco: Never Used  Substance Use Topics  . Alcohol use: No    Alcohol/week: 0.0 standard drinks    Comment: <1 day  . Drug use: No  Prior to Admission medications   Medication Sig Start Date End Date Taking? Authorizing Provider  acetaminophen (TYLENOL) 500 MG tablet Take 500 mg by mouth every 6 (six) hours as needed for pain.    [provider]  Albuterol Sulfate (PROAIR HFA IN) Inhale 2 puffs into the lungs every 6 (six) hours as needed (for shortness of breath).     [provider]  aspirin 81 MG tablet Take 81 mg by mouth daily.    [provider]  atorvastatin (LIPITOR) 20 MG tablet Take 20 mg by mouth daily.    [provider]  cetirizine-pseudoephedrine (ZYRTEC-D) 5-120 MG per tablet Take 1 tablet by mouth daily.     [provider]  diphenhydrAMINE (BENADRYL) 25 mg capsule Take 25 mg by mouth every 6 (six) hours as needed.    [provider]  fluticasone (FLONASE) 50 MCG/ACT nasal spray Place 2 sprays into the nose daily. 12/18/10 04/14/12   Martinique, Peter M, MD  fluticasone furoate-vilanterol (BREO ELLIPTA) 200-25 MCG/INH AEPB Inhale 1 puff into the lungs daily.    [provider]  levothyroxine (SYNTHROID, LEVOTHROID) 88 MCG tablet Take 100 mcg by mouth daily before breakfast.     [provider]  metoprolol succinate (TOPROL-XL) 50 MG 24 hr tablet TAKE ONE TABLET BY MOUTH ONE TIME DAILY 09/02/12   [place], Peter M, MD  montelukast (SINGULAIR) 10 MG tablet Take 10 mg by mouth at bedtime.    [provider]  Multiple Vitamin (MULTIVITAMIN WITH MINERALS) TABS Take 1 tablet by mouth daily.    [provider]  Multiple Vitamins-Minerals (PRESERVISION AREDS 2 PO) Take by mouth.    [provider]  pantoprazole (PROTONIX) 40 MG tablet Take 40 mg by mouth 2 (two) times daily.    [provider]  Vitamin D, Ergocalciferol, (DRISDOL) 50000 UNITS CAPS Take 50,000 Units by mouth every 7 (seven) days.    [provider]    Current Facility-Administered Medications  Medication Dose Route Frequency Provider Last Rate Last Admin  . 0.9 %  sodium chloride infusion   Intravenous Continuous Requan Hardge V, DO      . glucagon (human recombinant) (GLUCAGEN) 1 MG injection             Allergies as of 01/30/2019  . (No Known Allergies)     Review of Systems:    As per HPI, otherwise negative    Physical Exam:  Vital signs in last 24 hours: Temp:  [97.7 F (36.5 C)-98.2 F (36.8 C)] 97.9 F (36.6 C) (12/19 1800) Pulse Rate:  [73-132] 73 (12/19 1800) Resp:  [14-18] 18 (12/19 1800) BP: (147-174)/(78-99) 147/81 (12/19 1800) SpO2:  [97 %-100 %] 98 % (12/19 1800) Weight:  [61.2 kg] 61.2 kg (12/19 1417)   General:   Pleasant female in NAD.  Sitting upright, speaking in full sentences.  No teeth Head:  Normocephalic and atraumatic. Eyes:   No icterus.   Conjunctiva pink. Ears:  Normal auditory acuity. Neck:  Supple Lungs:  Respirations even and unlabored. Lungs clear to  auscultation bilaterally.   No wheezes, crackles, or rhonchi.  Heart:  Regular rate and rhythm; no MRG Abdomen:  Soft, nondistended, nontender. Normal bowel sounds. No appreciable masses or hepatomegaly.  Rectal:  Not performed.  Msk:  Symmetrical without gross deformities.  Extremities:  Without edema. Neurologic:  Alert and  oriented x4;  grossly normal neurologically. Skin:  Intact without significant lesions or rashes. Psych:  Alert and cooperative.  Normal affect.  LAB RESULTS: Recent Labs    01/30/19 1446  WBC 6.4  HGB 14.5  HCT 42.7  PLT 252   BMET Recent Labs    01/30/19 1446  NA 134*  K 3.9  CL 98  CO2 26  GLUCOSE 110*  BUN 15  CREATININE 0.91  CALCIUM 9.7   LFT Recent Labs    01/30/19 1446  PROT 7.3  ALBUMIN 4.1  AST 21  ALT 16  ALKPHOS 83  BILITOT 1.0   PT/INR No results for input(s): LABPROT, INR in the last 72 hours.  STUDIES: No results found.    Impression / Plan:   1) Acute food impaction 2) Dysphagia 3) Hiatal hernia 61) GERD  85 year old female presents with acute food impaction after eating chicken last evening.  Intolerant to p.o. intake.  Plan for urgent/emergent EGD for food retrieval.  -EGD now with MAC -Additional recommendations pending EGD findings -Will need to reestablish with our GI clinic.  I am happy to see her in the [place] clinic since Dr. Olevia Perches has since retired  The indications, risks, and benefits of EGD were explained to the patient in detail. Risks include but are not limited to bleeding, perforation, adverse reaction to medications, and cardiopulmonary compromise. Sequelae include but are not limited to the possibility of surgery, hositalization, and mortality. The patient verbalized understanding and wished to proceed. All questions answered, referred to scheduler. Further recommendations pending results of the exam.     LOS: 0 days   Lavena Bullion  01/30/2019, 6:05 PM

## 2019-01-30 NOTE — ED Triage Notes (Signed)
Pt reports that she has a piece of chicken tender stuck in her throat since last night. Pt states she is unable to swallow clear liquids.

## 2019-01-31 ENCOUNTER — Other Ambulatory Visit: Payer: Self-pay

## 2019-01-31 DIAGNOSIS — K551 Chronic vascular disorders of intestine: Secondary | ICD-10-CM

## 2019-02-01 ENCOUNTER — Ambulatory Visit (HOSPITAL_COMMUNITY)
Admission: RE | Admit: 2019-02-01 | Discharge: 2019-02-01 | Disposition: A | Discharge status: Home / Self Care | Payer: Medicare Other | Source: Ambulatory Visit | Attending: Family | Admitting: Family

## 2019-02-01 ENCOUNTER — Ambulatory Visit (INDEPENDENT_AMBULATORY_CARE_PROVIDER_SITE_OTHER): Payer: Medicare Other | Admitting: Family

## 2019-02-01 ENCOUNTER — Encounter: Payer: Self-pay | Admitting: Family

## 2019-02-01 ENCOUNTER — Other Ambulatory Visit: Payer: Self-pay

## 2019-02-01 VITALS — BP 144/74 | HR 79 | Temp 96.8°F | Resp 20 | Ht 65.0 in | Wt 131.0 lb

## 2019-02-01 DIAGNOSIS — K551 Chronic vascular disorders of intestine: Secondary | ICD-10-CM | POA: Insufficient documentation

## 2019-02-01 NOTE — Progress Notes (Signed)
CC: Follow up Chronic Mesenteric Ischemia  History of Present Illness  Megan Foster is a 85 y.o. ([birth year]) female whom Dr. 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 This was treated by PTA of the SMA by Dr. Chenon03/07/2012. Patient had lost 30 pounds and was having classic mesenteric ischemia symptoms with postprandial pain. The symptoms have been completely resolved and she returned to her normal weight.  She states she has a narrowing of the esophagus, sees GI for this. On 01-30-19 she was seen in the ED for food impaction in her esophagus, removed by endoscopy.  She denies post prandial abdominal pain. She takes stool softener for constipation.   She is drinking an Ensure twice daily. States she does not have a good appetite, denies food fear. Her only child died 57 months ago with malignant melanoma.  She has lost some weight, she believes, due to her daughter's recent death. She states that she has several family members and neighbors that are supportive.  She is a retired [employer].   Pt denies any history of stroke or TIA. She denies claudication type symptoms with walking.   She has never used tobacco, does not have DM. She takes a statin, ASA; she takes no anticoagulants.   Past Medical History:  Diagnosis Date  . Arthritis   . Asthma   . Depression   . Elevated LFTs   . Esophageal dysmotility   . GERD (gastroesophageal reflux disease)   . Headache(784.0)   . Hiatal hernia   . Hyperplastic polyps of stomach   . Hypertension   . Hypothyroid   . Irritable bowel syndrome   . Nausea & vomiting 04/02/2012  . Osteoporosis   . Shingles   . Sinusitis   . Tachycardia   . Vitamin D deficiency     Social History Social History   Tobacco Use  . Smoking status: Never Smoker  . Smokeless tobacco: Never Used  Substance Use Topics  . Alcohol use: No    Alcohol/week: 0.0 standard drinks    Comment:  <1 day  . Drug use: No    Family History Family History  Problem Relation Age of Onset  . Hypertension Mother   . Heart attack Mother   . Heart attack Father 64  . Hypertension Father   . Peripheral vascular disease Father   . Heart failure Sister   . Cancer Sister   . Hypertension Brother   . Cancer Brother   . Hypertension Brother   . Breast cancer Sister   . Ovarian cancer Other        mat cousin  . Prostate cancer Maternal Uncle   . Colon polyps Sister        siblings  . Colon cancer Other        Maternal Aunt x3 Maternal Uncle x 2  . Esophageal cancer Brother        died at 74  . Kidney disease Brother     Surgical History Past Surgical History:  Procedure Laterality Date  . AORTOGRAM  04/16/12  . COLONOSCOPY  2008   normal  . ESOPHAGOGASTRODUODENOSCOPY  2014   multiple   . MULTIPLE TOOTH EXTRACTIONS    . TOTAL ABDOMINAL HYSTERECTOMY  1999    No Known Allergies  Current Outpatient Medications  Medication Sig Dispense Refill  . acetaminophen (TYLENOL) 500 MG tablet Take 500 mg by mouth every 6 (six) hours as needed for pain.    Marland Kitchen  Albuterol Sulfate (PROAIR HFA IN) Inhale 2 puffs into the lungs every 6 (six) hours as needed (for shortness of breath).     Marland Kitchen aspirin 81 MG tablet Take 81 mg by mouth daily.    Marland Kitchen atorvastatin (LIPITOR) 20 MG tablet Take 20 mg by mouth daily.    . cetirizine-pseudoephedrine (ZYRTEC-D) 5-120 MG per tablet Take 1 tablet by mouth daily.     . diphenhydrAMINE (BENADRYL) 25 mg capsule Take 25 mg by mouth every 6 (six) hours as needed.    . fluticasone furoate-vilanterol (BREO ELLIPTA) 200-25 MCG/INH AEPB Inhale 1 puff into the lungs daily.    Marland Kitchen levothyroxine (SYNTHROID, LEVOTHROID) 88 MCG tablet Take 100 mcg by mouth daily before breakfast.     . metoprolol succinate (TOPROL-XL) 50 MG 24 hr tablet TAKE ONE TABLET BY MOUTH ONE TIME DAILY 30 tablet 5  . montelukast (SINGULAIR) 10 MG tablet Take 10 mg by mouth at bedtime.    . Multiple  Vitamin (MULTIVITAMIN WITH MINERALS) TABS Take 1 tablet by mouth daily.    . Multiple Vitamins-Minerals (PRESERVISION AREDS 2 PO) Take by mouth.    . pantoprazole (PROTONIX) 40 MG tablet Take 40 mg by mouth 2 (two) times daily.    . sucralfate (CARAFATE) 1 GM/10ML suspension Take 10 mLs (1 g total) by mouth 4 (four) times daily. 420 mL 1  . Vitamin D, Ergocalciferol, (DRISDOL) 50000 UNITS CAPS Take 50,000 Units by mouth every 7 (seven) days.    . fluticasone (FLONASE) 50 MCG/ACT nasal spray Place 2 sprays into the nose daily. 1 g 0   No current facility-administered medications for this visit.    ROS: see HPI for pertinent positives and negatives    Physical Examination  Vitals:   02/01/19 0954  BP: (!) 144/74  Pulse: 79  Resp: 20  Temp: (!) 96.8 F (36 C)  TempSrc: Oral  SpO2: 97%  Weight: 131 lb (59.4 kg)  Height: 5\' 5"  (1.651 m)   Body mass index is 21.8 kg/m.  General: A&O x 3, WDWN, elderly female in NAD Gait: slow, steady, using cane HEENT: no gross abnormalities  Pulmonary: Sym exp, good air movt, CTAB, no rales, rhonchi, or wheezes Cardiac: RRR, Nl S1, S2, no detected Murmur.  Vascular: palpable radial pulses, no signs of ischemia in lower extremities   Gastrointestinal: soft, NTND, -G/R, - HSM, - masses palpated, - CVAT. Musculoskeletal: M/S 4/5 throughout, Extremities without ischemic changes Neurologic: Pain and light touch intact in extremities, Motor exam as listed above. CN 2-12 intact.  Skin: No rash, no cellulitis, no ulcers noted.  Psychiatric: Normal thought content, mood appropriate to clinical situation.     DATA   Mesenteric Duplex Findings (02-01-19): +----------------------+--------+--------+------+--------+ Mesenteric            PSV cm/sEDV cm/sPlaqueComments +----------------------+--------+--------+------+--------+ Aorta at SMA             76                           +----------------------+--------+--------+------+--------+ Celiac Artery Origin    120      40                  +----------------------+--------+--------+------+--------+ Celiac Artery Proximal  181      38                  +----------------------+--------+--------+------+--------+ SMA Origin  152      48                  +----------------------+--------+--------+------+--------+ SMA Proximal            249      53                  +----------------------+--------+--------+------+--------+ SMA Mid                 149      38                  +----------------------+--------+--------+------+--------+ CHA                     111      22                  +----------------------+--------+--------+------+--------+ Splenic                 170      24                  +----------------------+--------+--------+------+--------+ IMA                     107      0                   +----------------------+--------+--------+------+--------+ Summary: Mesenteric: Normal Celiac artery , Inferior Mesenteric artery, Splenic artery and Hepatic artery findings. <70% SMA stenosis.    Mesenteric Duplex (Date: 01/31/2017):   Ao: 72 cm/s  Celiac artery: 202 cm/s  SMA proximal: 221 cm/s  SMA mid: 165 cm/s  IMA: NV  Patent SMA with <70% stenosis  Technically difficult exam due to bowel gas.    Medical Decision Making  Deshae Anamika Herbert is a 85 y.o. female who presents withasymptomatic chronic mesenteric ischemia. This was treated by PTA of the SMA by Dr. Bridgett Larsson 04/16/2012. She no longer has food fear, does not have post prandial abdominal pain.   Today's mesenteric duplex shows no significant stenosis of the mesenteric arteries.   Her only child died recently, within 106 month of being diagnosed with malignant melanoma. She states her PCP offered to refer her for counseling, I offered to refer her for grief counseling; she states she  will think about it and ask Dr. Brigitte Pulse for a referral if she decides on counseling.    Based on her exam and studies, I have offered the patientreturn in 1 year with mesenteric duplex. I advised her that if she develops pain in her stomach after eating to notify [facility].    Clemon Chambers, RN, MSN, FNP-C [facility] Office: [phone]  Clinic MD: Trula Slade  02/01/2019, 10:02 AM

## 2019-02-01 NOTE — Patient Instructions (Signed)
Chronic Mesenteric Ischemia  Chronic mesenteric ischemia is poor blood flow (circulation) in the vessels that supply blood to the stomach, intestines, and liver (mesenteric organs). When the blood supply is severely restricted, these organs cannot work properly. This condition is also called mesenteric angina, or intestinal angina. This condition is a long-term (chronic) condition. It happens when an artery or vein that provides blood to the mesenteric organs gradually becomes blocked or narrows over time, restricting the blood supply to these organs. What are the causes? This condition is commonly caused by fatty deposits that build up in an artery (plaque), which can narrow the artery and restrict blood flow. Other causes include:  Weakened areas in blood vessel walls (aneurysms).  Conditions that cause twisting or inflammation of blood vessels, such as fibromuscular dysplasia or arteritis.  A disorder in which blood clots form in the veins (venous thrombosis).  Scarring and thickening (fibrosis) of blood vessels caused by radiation therapy.  A tear in the aorta, the body's main artery (aortic dissection).  Blood vessel problems after illegal drug use, such as use of cocaine.  Tumors in the nervous system (neurofibromatosis).  Certain autoimmune diseases, such as lupus. What increases the risk? The following factors may make you more likely to develop this condition:  Being female.  Being over age 50, especially if you have a history of heart problems.  Smoking.  Having congestive heart failure.  Having an irregular heartbeat (arrhythmia).  Having a history of heart attack or stroke.  Having diabetes.  Having high cholesterol.  Having high blood pressure (hypertension).  Being overweight or obese.  Having kidney disease (renal disease) that requires dialysis. What are the signs or symptoms? Symptoms of this condition include:  Pain or cramps in the abdomen that  develop 15-60 minutes after a meal. This pain may last for 1-3 hours. Some people may develop a fear of eating because of this symptom.  Weight loss.  Diarrhea.  Bloody stool.  Nausea.  Vomiting.  Bloating.  Abdominal pain after stress or with exercise. How is this diagnosed? This condition is diagnosed based on:  Your medical history.  A physical exam.  Tests, such as: ? Ultrasound. ? CT scan. ? Blood tests. ? Urine tests. ? An imaging test that involves injecting a dye into your arteries to show blood flow through blood vessels (angiogram). This can help to show if there are any blockages in the vessels that lead to the intestines. ? Passing a small probe through the mouth and into the stomach to measure the output of carbon dioxide (gastric tonometry). This can help to indicate whether there is decreased blood flow to the stomach and intestines. How is this treated? This condition may be treated with:  Dietary changes such as eating smaller, low-fat, meals more frequently.  Lifestyle changes to treat underlying conditions that contribute to the disease, such as high cholesterol and high blood pressure.  Medicines to reduce blood clotting and increase blood flow.  Surgery to remove the blockage, repair arteries or veins, and restore blood flow. This may involve: ? Angioplasty. This is surgery to widen the affected artery, reduce the blockage, and sometimes insert a small, mesh tube (stent). ? Bypass surgery. This may be done to go around (bypass) the blockage and reconnect healthy arteries or veins. ? Placing a stent in the affected area. This may be done to help keep blocked arteries open. Follow these instructions at home: Eating and drinking   Eat a heart-healthy diet. This   includes fresh fruits and vegetables, whole grains, and lean proteins like chicken, fish, and beans.  Avoid foods that contain a lot of: ? Salt (sodium). ? Sugar. ? Saturated fat (such as  red meat). ? Trans fat (such as in fried foods).  Stay hydrated. Drink enough fluid to keep your urine pale yellow. Lifestyle  Stay active and get regular exercise as told by your health care provider. Aim for 150 minutes of moderate activity or 75 minutes of vigorous activity a week. Ask your health care provider what activities and forms of exercise are safe for you.  Maintain a healthy weight.  Work with your health care provider to manage your cholesterol.  Manage any other health problems you have, such as high blood pressure, diabetes, or heart rhythm problems.  Do not use any products that contain nicotine or tobacco, such as cigarettes, e-cigarettes, and chewing tobacco. If you need help quitting, ask your health care provider. General instructions  Take over-the-counter and prescription medicines only as told by your health care provider.  Keep all follow-up visits as told by your health care provider. This is important.  You may need to take actions to prevent or treat constipation, such as: ? Drink enough fluid to keep your urine pale yellow. ? Take over-the-counter or prescription medicines. ? Eat foods that are high in fiber, such as beans, whole grains, and fresh fruits and vegetables. ? Limit foods that are high in fat and processed sugars, such as fried or sweet foods. Contact a health care provider if:  Your symptoms do not improve or they return after treatment.  You have a fever.  You are constipated. Get help right away if you:  Have severe abdominal pain.  Have severe chest pain.  Have shortness of breath.  Feel weak or dizzy.  Have fast or irregular heartbeats (palpitations).  Have numbness or weakness in your face, arm, or leg.  Are confused.  Have trouble speaking or people have trouble understanding what you are saying.  Have trouble urinating.  Have blood in your stool.  Have severe nausea, vomiting, or persistent diarrhea. These  symptoms may represent a serious problem that is an emergency. Do not wait to see if the symptoms will go away. Get medical help right away. Call your local emergency services (911 in the [place] Do not drive yourself to the hospital. Summary  Mesenteric ischemia is poor circulation in the vessels that supply blood to the the stomach, intestines, and liver (mesenteric organs).  This condition happens when an artery or vein that provides blood to the mesenteric organs gradually becomes blocked or narrow, restricting the blood supply to the organs.  This condition is commonly caused by fatty deposits that build up in an artery (plaque), which can narrow the artery and restrict blood flow.  You are more likely to develop this condition if you are over age 42 and have a history of heart problems, high blood pressure, diabetes, or high cholesterol.  This condition is usually treated with medicines, dietary and lifestyle changes, and surgery to remove the blockage, repair arteries or veins, and restore blood flow. This information is not intended to replace advice given to you by your health care provider. Make sure you discuss any questions you have with your health care provider. Document Released: 09/17/2010 Document Revised: 10/03/2017 Document Reviewed: 10/03/2017 Elsevier Patient Education  2020 [employer].

## 2019-02-08 ENCOUNTER — Encounter (HOSPITAL_COMMUNITY): Payer: Medicare Other

## 2019-02-08 ENCOUNTER — Ambulatory Visit: Payer: Medicare Other | Admitting: Family

## 2019-02-08 NOTE — Anesthesia Postprocedure Evaluation (Signed)
Anesthesia Post Note  Patient: Megan Foster  Procedure(s) Performed: ESOPHAGOGASTRODUODENOSCOPY (EGD) (Left ) FOREIGN BODY REMOVAL     Patient location during evaluation: Endoscopy Anesthesia Type: MAC Level of consciousness: awake and alert Pain management: pain level controlled Vital Signs Assessment: post-procedure vital signs reviewed and stable Respiratory status: spontaneous breathing, nonlabored ventilation, respiratory function stable and patient connected to nasal cannula oxygen Cardiovascular status: blood pressure returned to baseline and stable Postop Assessment: no apparent nausea or vomiting Anesthetic complications: no    Last Vitals:  Vitals:   01/30/19 1850 01/30/19 2003  BP: 136/82 (!) 150/70  Pulse: 99 94  Resp: 14 16  Temp:    SpO2: 99% 97%    Last Pain:  Vitals:   01/30/19 2017  TempSrc:   PainSc: 0-No pain                 Travious Vanover S

## 2019-02-22 ENCOUNTER — Other Ambulatory Visit: Payer: Self-pay | Admitting: *Deleted

## 2019-02-22 DIAGNOSIS — K551 Chronic vascular disorders of intestine: Secondary | ICD-10-CM

## 2019-03-01 ENCOUNTER — Encounter: Payer: Self-pay | Admitting: Gastroenterology

## 2019-03-01 ENCOUNTER — Other Ambulatory Visit: Payer: Self-pay

## 2019-03-01 ENCOUNTER — Ambulatory Visit (INDEPENDENT_AMBULATORY_CARE_PROVIDER_SITE_OTHER): Payer: Medicare Other | Admitting: Gastroenterology

## 2019-03-01 VITALS — BP 134/72 | HR 88 | Temp 97.5°F | Ht 65.5 in | Wt 137.1 lb

## 2019-03-01 DIAGNOSIS — K222 Esophageal obstruction: Secondary | ICD-10-CM | POA: Diagnosis not present

## 2019-03-01 DIAGNOSIS — K449 Diaphragmatic hernia without obstruction or gangrene: Secondary | ICD-10-CM | POA: Diagnosis not present

## 2019-03-01 DIAGNOSIS — K219 Gastro-esophageal reflux disease without esophagitis: Secondary | ICD-10-CM | POA: Diagnosis not present

## 2019-03-01 DIAGNOSIS — Z01818 Encounter for other preprocedural examination: Secondary | ICD-10-CM

## 2019-03-01 DIAGNOSIS — K551 Chronic vascular disorders of intestine: Secondary | ICD-10-CM

## 2019-03-01 DIAGNOSIS — Z800 Family history of malignant neoplasm of digestive organs: Secondary | ICD-10-CM | POA: Diagnosis not present

## 2019-03-01 MED ORDER — PANTOPRAZOLE SODIUM 40 MG PO TBEC: 40.0000 mg | DELAYED_RELEASE_TABLET | Freq: Two times a day (BID) | ORAL | 5 refills | Status: DC

## 2019-03-01 NOTE — Patient Instructions (Signed)
You have been scheduled for an endoscopy. Please follow written instructions given to you at your visit today. If you use inhalers (even only as needed), please bring them with you on the day of your procedure. Your physician has requested that you go to www.startemmi.com and enter the access code given to you at your visit today. This web site gives a general overview about your procedure. However, you should still follow specific instructions given to you by our office regarding your preparation for the procedure.  We have sent the following medications to your pharmacy for you to pick up at your convenience:  It was a pleasure to see you today!  Vito Cirigliano, D.O.

## 2019-03-01 NOTE — Progress Notes (Signed)
P  Chief Complaint:    Dysphagia, food impaction, esophageal stenosis  GI History: 86 y.o. female  retired Therapist, sports with a history of hypertension, hiatal hernia, GERD, esophageal stenosis, reported esophageal dysmotility, with recent acute food impaction in 01/2019.  Was in her usual state of health prior to symptom onset.  Emergent EGD with food retrieval as outlined below.  Longstanding history of reflux, which has otherwise been well controlled with Protonix 40 mg bid. Trialed going down to daily, but had breakthrough sxs. Rare use of Mylanta for prn breakthrough.   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  Last seen in the [facility]ascular Clinic 01/2019.  Mesenteric duplex shows no significant stenoses.  Endoscopic History: -EGD (01/2019, Dr. Bryan Lemma; food impaction): Food bolus in lower one third removed, moderate intrinsic stenosis at 35 cm dilated with endoscope passage alone with 2 mucosal rents, 2 cm HH, gastric polyps -EGD (03/2012, Dr. Olevia Perches): 2 cm HH, mild gastritis -EGD (02/2008, Dr. Olevia Perches): Normal esophagus, empiric dilation with 16 mm Savary dilator without mucosal rent -EGD (a 02/2006, Dr. Olevia Perches): Normal esophagus, 15 mm gastric polyp -EGD (02/2001, Dr. Olevia Perches): Normal  -UGI series (09/2006): Small hiatal hernia, mild Schatzki's ring, mild esophageal dysmotility noted   HPI:     Patient is a 86 y.o. female presenting to the [facility]astro[facility]ology Clinic for follow-up.  Initially seen by me on 01/30/2019 for acute food impaction requiring emergent EGD.  EGD with food bolus in the lower 1/3rd of the esophagus along with moderate intrinsic stenosis at 35 cm (just distal to food impaction) which was dilated with passage of the endoscope alone with 2 mucosal rents.  She was treated with Protonix 40 mg bid (was already taking), Carafate qid, and recommended to maintain soft diet with repeat EGD in  6 weeks for evaluation and probable dilation.  Today, she states she is feeling well.  Tolerating soft diet without any issues.  Tolerating medications as prescribed. Completed sucralfate x2 weeks. No recurrence of dysphagia.   Requesting refill of Protonix 40 mg BID.   Does have an issue with dentures not fitting (too big), so doesn't wear them. Miantains soft diet. Drinking 2 Ensures/day.   Other than above, no new labs or imaging since ER evaluation last month.  Normal CBC and CMP with negative Covid at that time.  Review of systems:     No chest pain, no SOB, no fevers, no urinary sx   Past Medical History:  Diagnosis Date  . Arthritis   . Asthma   . Depression   . Elevated LFTs   . Esophageal dysmotility   . GERD (gastroesophageal reflux disease)   . Headache(784.0)   . Hiatal hernia   . Hyperplastic polyps of stomach   . Hypertension   . Hypothyroid   . Irritable bowel syndrome   . Macular degeneration   . Nausea & vomiting 04/02/2012  . Osteoporosis   . Shingles   . Sinusitis   . Tachycardia   . Vitamin D deficiency     Patient's surgical history, family medical history, social history, medications and allergies were all reviewed in Epic    Current Outpatient Medications  Medication Sig Dispense Refill  . acetaminophen (TYLENOL) 500 MG tablet Take 500 mg by mouth every 6 (six) hours as needed for pain.    . Albuterol Sulfate (PROAIR HFA IN) Inhale 2 puffs into the lungs every 6 (  six) hours as needed (for shortness of breath).     Marland Kitchen aspirin 81 MG tablet Take 81 mg by mouth daily.    Marland Kitchen atorvastatin (LIPITOR) 20 MG tablet Take 20 mg by mouth daily.    . cetirizine-pseudoephedrine (ZYRTEC-D) 5-120 MG per tablet Take 1 tablet by mouth daily.     . diphenhydrAMINE (BENADRYL) 25 mg capsule Take 25 mg by mouth every 6 (six) hours as needed.    . fluticasone (FLONASE) 50 MCG/ACT nasal spray Place 2 sprays into the nose daily. (Patient taking differently: Place 1 spray into  the nose daily. ) 1 g 0  . fluticasone furoate-vilanterol (BREO ELLIPTA) 200-25 MCG/INH AEPB Inhale 1 puff into the lungs daily.    Marland Kitchen levothyroxine (SYNTHROID, LEVOTHROID) 88 MCG tablet Take 88 mcg by mouth daily before breakfast.     . metoprolol succinate (TOPROL-XL) 50 MG 24 hr tablet TAKE ONE TABLET BY MOUTH ONE TIME DAILY 30 tablet 5  . montelukast (SINGULAIR) 10 MG tablet Take 10 mg by mouth at bedtime.    . Multiple Vitamins-Minerals (CENTRUM SILVER 50+WOMEN PO) Take 1 tablet by mouth daily.    . Multiple Vitamins-Minerals (PRESERVISION AREDS 2 PO) Take 1 tablet by mouth 2 (two) times daily.     . pantoprazole (PROTONIX) 40 MG tablet Take 40 mg by mouth 2 (two) times daily.    . Vitamin D, Ergocalciferol, (DRISDOL) 50000 UNITS CAPS Take 50,000 Units by mouth every 7 (seven) days.    . sucralfate (CARAFATE) 1 GM/10ML suspension Take 10 mLs (1 g total) by mouth 4 (four) times daily. (Patient not taking: Reported on 03/01/2019) 420 mL 1   No current facility-administered medications for this visit.    Physical Exam:     BP 134/72   Pulse 88   Temp (!) 97.5 F (36.4 C)   Ht 5' 5.5" (1.664 m)   Wt 137 lb 2 oz (62.2 kg)   BMI 22.47 kg/m   GENERAL:  Pleasant female in NAD PSYCH: : Cooperative, normal affect EENT:  conjunctiva pink, mucous membranes moist, neck supple without masses CARDIAC:  RRR, no murmur heard, no peripheral edema PULM: Normal respiratory effort, lungs CTA bilaterally, no wheezing ABDOMEN:  Nondistended, soft, nontender. No obvious masses, no hepatomegaly,  normal bowel sounds SKIN:  turgor, no lesions seen Musculoskeletal:  Normal muscle tone, normal strength NEURO: Alert and oriented x 3, no focal neurologic deficits   IMPRESSION and PLAN:    1) Esophageal Stenosis 2) History of food impaction -Repeat EGD with dilation of known lower esophageal stenosis -Encouraged to follow-up about ill fitting dentures for possible refitting -Resume soft foods for now  until repeat EGD with dilation  3) GERD 4) Hiatal hernia -Reflux symptoms well controlled on current therapy -Refilled Protonix 40 mg bid -Discussed risks, benefits, alternatives of prolonged high-dose PPI use.  She does have a history of osteoporosis.  However, has had breakthrough reflux whenever she has trialed titration down to daily dosing.  After discussion regarding risks/ADRs of PPIs, she would like to continue with current therapy  5) Family history of Esophageal Cancer -Brother died of esophageal cancer in his 76s.  Can certainly evaluate for premalignant changes at time of repeat EGD as above  6) History of chronic Mesenteric Ischemia -Resolved after SMA stent placement 2014.  No recurrence.  Recent ultrasound with patency  7) Chronic aspirin use -Okay to resume in the perioperative period  The indications, risks, and benefits of EGD were explained to the  patient in detail. Risks include but are not limited to bleeding, perforation, adverse reaction to medications, and cardiopulmonary compromise. Sequelae include but are not limited to the possibility of surgery, hositalization, and mortality. The patient verbalized understanding and wished to proceed. All questions answered, referred to scheduler. Further recommendations pending results of the exam.       Lavena Bullion ,DO, FACG 03/01/2019, 10:27 AM

## 2019-03-04 ENCOUNTER — Ambulatory Visit (INDEPENDENT_AMBULATORY_CARE_PROVIDER_SITE_OTHER): Payer: Medicare Other

## 2019-03-04 ENCOUNTER — Other Ambulatory Visit: Payer: Self-pay | Admitting: Gastroenterology

## 2019-03-04 DIAGNOSIS — Z1159 Encounter for screening for other viral diseases: Secondary | ICD-10-CM

## 2019-03-05 LAB — SARS CORONAVIRUS 2 (TAT 6-24 HRS): SARS Coronavirus 2: NEGATIVE

## 2019-03-08 ENCOUNTER — Encounter: Payer: Self-pay | Admitting: Gastroenterology

## 2019-03-08 ENCOUNTER — Other Ambulatory Visit: Payer: Self-pay

## 2019-03-08 ENCOUNTER — Ambulatory Visit (AMBULATORY_SURGERY_CENTER): Payer: Medicare Other | Admitting: Gastroenterology

## 2019-03-08 VITALS — BP 144/67 | HR 74 | Temp 98.2°F | Resp 16 | Ht 65.0 in | Wt 137.0 lb

## 2019-03-08 DIAGNOSIS — K317 Polyp of stomach and duodenum: Secondary | ICD-10-CM

## 2019-03-08 DIAGNOSIS — K222 Esophageal obstruction: Secondary | ICD-10-CM

## 2019-03-08 DIAGNOSIS — K449 Diaphragmatic hernia without obstruction or gangrene: Secondary | ICD-10-CM | POA: Diagnosis not present

## 2019-03-08 DIAGNOSIS — R1319 Other dysphagia: Secondary | ICD-10-CM

## 2019-03-08 DIAGNOSIS — R1310 Dysphagia, unspecified: Secondary | ICD-10-CM | POA: Diagnosis not present

## 2019-03-08 DIAGNOSIS — K219 Gastro-esophageal reflux disease without esophagitis: Secondary | ICD-10-CM | POA: Diagnosis not present

## 2019-03-08 MED ORDER — SODIUM CHLORIDE 0.9 % IV SOLN: 500.0000 mL | Freq: Once | INTRAVENOUS | Status: DC

## 2019-03-08 NOTE — Op Note (Signed)
[facility] Patient Name: Megan Foster Procedure Date: 03/08/2019 10:05 AM MRN: [id number] Endoscopist: Gerrit Heck , MD Age: 86 Referring MD:  Date of Birth: 13-Nov-1927 Gender: Female Account #: [id number] Procedure:                Upper GI endoscopy Indications:              Dysphagia, Esophageal reflux, For therapy of                            esophageal stenosis                           86 yo female with history of GERD, well controlled                            on high dose PPI, along with esophageal stenosis                            c/b esophageal food impcation in 01/2019 requiring                            endoscopic retireval. She presents today for                            re-evaluation and esophageal dilation.                           -EGD (01/2019, Dr. Bryan Lemma; food impaction):                            Food bolus in lower one third removed, moderate                            intrinsic stenosis at 35 cm dilated with endoscope                            passage alone with 2 mucosal rents, 2 cm HH,                            gastric polyps                           -EGD (03/2012, Dr. Olevia Perches): 2 cm HH, mild gastritis                           -EGD (02/2008, Dr. Olevia Perches): Normal esophagus,                            empiric dilation with 16 mm Savary dilator without                            mucosal rent                           -EGD (a 02/2006, Dr. Olevia Perches): Normal esophagus,  15                            mm gastric polyp                           -EGD (02/2001, Dr. Olevia Perches): Normal Medicines:                Monitored Anesthesia Care Procedure:                Pre-Anesthesia Assessment:                           - Prior to the procedure, a History and Physical                            was performed, and patient medications and                            allergies were reviewed. The patient's tolerance of                            previous anesthesia  was also reviewed. The risks                            and benefits of the procedure and the sedation                            options and risks were discussed with the patient.                            All questions were answered, and informed consent                            was obtained. Prior Anticoagulants: The patient has                            taken no previous anticoagulant or antiplatelet                            agents. ASA Grade Assessment: III - A patient with                            severe systemic disease. After reviewing the risks                            and benefits, the patient was deemed in                            satisfactory condition to undergo the procedure.                           After obtaining informed consent, the endoscope was  passed under direct vision. Throughout the                            procedure, the patient's blood pressure, pulse, and                            oxygen saturations were monitored continuously. The                            Endoscope was introduced through the mouth, and                            advanced to the third part of duodenum. The upper                            GI endoscopy was accomplished without difficulty.                            The patient tolerated the procedure well. Scope In: Scope Out: Findings:                 One benign-appearing, intrinsic moderate stenosis                            was found 35 cm from the incisors. This stenosis                            measured 1 cm (in length). The stenosis was                            traversed. A TTS dilator was passed through the                            scope. Dilation with an 18-19-20 mm balloon dilator                            was performed to 19 mm. The dilation site was                            examined and showed mild mucosal disruption. This                            was then biopsied with a cold  forceps for further                            fracturing of the ring. No pathology specimen sent                            from this. Estimated blood loss was minimal.                           A 2 cm hiatal hernia was present.  The upper third of the esophagus and middle third                            of the esophagus were normal.                           Multiple 2 to 8 mm sessile polyps with no bleeding                            and no stigmata of recent bleeding were found in                            the gastric fundus and in the gastric body. Several                            of these polyps were removed with a cold biopsy                            forceps for histologic representative evaluation.                            Resection and retrieval were complete. Estimated                            blood loss was minimal.                           The pylorus was normal.                           The duodenal bulb, first portion of the duodenum                            and second portion of the duodenum were normal. Complications:            No immediate complications. Estimated Blood Loss:     Estimated blood loss was minimal. Impression:               - Benign-appearing esophageal stenosis. Dilated                            with 18, 19 mm TTS balloon with appropriate mucosal                            rent consistent with successful dilation. The ring                            was then further fractured using cold forceps.                           - 2 cm hiatal hernia.                           - Normal upper third of esophagus and middle third  of esophagus.                           - Multiple gastric polyps. Several were resected                            and retrieved.                           - Normal pylorus.                           - Normal duodenal bulb, first portion of the                             duodenum and second portion of the duodenum. Recommendation:           - Patient has a contact number available for                            emergencies. The signs and symptoms of potential                            delayed complications were discussed with the                            patient. Return to normal activities tomorrow.                            Written discharge instructions were provided to the                            patient.                           - Soft diet for 2 days following esophageal                            dilation, then slowly progress diet as tolerated.                           - Continue present medications.                           - Await pathology results.                           - Repeat upper endoscopy PRN for retreatment.                           - Return to GI clinic at appointment to be                            scheduled.                           - Follow-up with PCM regarding ill-fitting dentures  and referral for evaluation. Gerrit Heck, MD 03/08/2019 10:40:20 AM

## 2019-03-08 NOTE — Patient Instructions (Signed)
Please read handouts provided. Soft diet for two days, then progress to regular diet. Await pathology results. Continue present medications. Return to GI clinic at appointment to be scheduled. Follow-up with [facility] regarding ill-fitting dentures and referral for evaluation.      YOU HAD AN ENDOSCOPIC PROCEDURE TODAY AT [facility]:   Refer to the procedure report that was given to you for any specific questions about what was found during the examination.  If the procedure report does not answer your questions, please call your gastroenterologist to clarify.  If you requested that your care partner not be given the details of your procedure findings, then the procedure report has been included in a sealed envelope for you to review at your convenience later.  YOU SHOULD EXPECT: Some feelings of bloating in the abdomen. Passage of more gas than usual.  Walking can help get rid of the air that was put into your GI tract during the procedure and reduce the bloating. If you had a lower endoscopy (such as a colonoscopy or flexible sigmoidoscopy) you may notice spotting of blood in your stool or on the toilet paper. If you underwent a bowel prep for your procedure, you may not have a normal bowel movement for a few days.  Please Note:  You might notice some irritation and congestion in your nose or some drainage.  This is from the oxygen used during your procedure.  There is no need for concern and it should clear up in a day or so.  SYMPTOMS TO REPORT IMMEDIATELY:    Following upper endoscopy (EGD)  Vomiting of blood or coffee ground material  New chest pain or pain under the shoulder blades  Painful or persistently difficult swallowing  New shortness of breath  Fever of 100F or higher  Black, tarry-looking stools  For urgent or emergent issues, a gastroenterologist can be reached at any hour by calling [phone].   DIET Drink plenty of fluids but you should avoid  alcoholic beverages for 24 hours.  ACTIVITY:  You should plan to take it easy for the rest of today and you should NOT DRIVE or use heavy machinery until tomorrow (because of the sedation medicines used during the test).    FOLLOW UP: Our staff will call the number listed on your records 48-72 hours following your procedure to check on you and address any questions or concerns that you may have regarding the information given to you following your procedure. If we do not reach you, we will leave a message.  We will attempt to reach you two times.  During this call, we will ask if you have developed any symptoms of COVID 19. If you develop any symptoms (ie: fever, flu-like symptoms, shortness of breath, cough etc.) before then, please call [phone].  If you test positive for Covid 19 in the 2 weeks post procedure, please call and report this information to [place].    If any biopsies were taken you will be contacted by phone or by letter within the next 1-3 weeks.  Please call us at [phone] if you have not heard about the biopsies in 3 weeks.    SIGNATURES/CONFIDENTIALITY: You and/or your care partner have signed paperwork which will be entered into your electronic medical record.  These signatures attest to the fact that that the information above on your After Visit Summary has been reviewed and is understood.  Full responsibility of the confidentiality of this discharge information lies with you  and/or your care-partner. 

## 2019-03-08 NOTE — Progress Notes (Signed)
Pt tolerated well. VSS. Awake and to recovery. 

## 2019-03-08 NOTE — Progress Notes (Signed)
Temp   VS  CW  Pt's states no medical or surgical changes since previsit or office visit.   

## 2019-03-08 NOTE — Progress Notes (Signed)
Called to room to assist during endoscopic procedure.  Patient ID and intended procedure confirmed with present staff. Received instructions for my participation in the procedure from the performing physician.  

## 2019-03-10 ENCOUNTER — Telehealth: Payer: Self-pay

## 2019-03-10 NOTE — Telephone Encounter (Signed)
  Follow up Call-  Call back number 03/08/2019  Post procedure Call Back phone  # [phone]  Permission to leave phone message Yes  Some recent data might be hidden     Patient questions:  Do you have a fever, pain , or abdominal swelling? No. Pain Score  0 *  Have you tolerated food without any problems? Yes.    Have you been able to return to your normal activities? Yes.    Do you have any questions about your discharge instructions: Diet   No. Medications  No. Follow up visit  No.  Do you have questions or concerns about your Care? No.  Actions: * If pain score is 4 or above: No action needed, pain <4.  1. Have you developed a fever since your procedure? no  2.   Have you had an respiratory symptoms (SOB or cough) since your procedure? no  3.   Have you tested positive for COVID 19 since your procedure no  4.   Have you had any family members/close contacts diagnosed with the COVID 19 since your procedure?  no   If yes to any of these questions please route to Joylene John, RN and Alphonsa Gin, Therapist, sports.

## 2019-03-10 NOTE — Telephone Encounter (Signed)
Left message for patient to call back to the office; Patient needs to be scheduled for:  4 wk f/u EGD-dysphagia/esophageal reflux/for therapy of esophageal stenosis/Cirigliano/

## 2019-03-11 NOTE — Telephone Encounter (Signed)
Patient returned call to the office-patient has been schedueld for f/u OV on 03/29/2019 at 2:40 pm at the [facility] office-patient is aware and has requested the [facility] office for convenience; Patient advised to call back to the office at [phone] should questions/concerns arise;  Patient verbalized understanding of information/instructions;

## 2019-03-12 ENCOUNTER — Encounter: Payer: Self-pay | Admitting: Gastroenterology

## 2019-03-29 ENCOUNTER — Ambulatory Visit: Payer: Medicare Other | Admitting: Gastroenterology

## 2019-04-08 DIAGNOSIS — Z23 Encounter for immunization: Secondary | ICD-10-CM | POA: Diagnosis not present

## 2019-04-20 ENCOUNTER — Ambulatory Visit (INDEPENDENT_AMBULATORY_CARE_PROVIDER_SITE_OTHER): Payer: Medicare Other | Admitting: Gastroenterology

## 2019-04-20 ENCOUNTER — Encounter: Payer: Self-pay | Admitting: Gastroenterology

## 2019-04-20 ENCOUNTER — Other Ambulatory Visit: Payer: Self-pay

## 2019-04-20 VITALS — BP 130/78 | HR 60 | Temp 98.4°F | Ht 66.0 in | Wt 136.5 lb

## 2019-04-20 DIAGNOSIS — K219 Gastro-esophageal reflux disease without esophagitis: Secondary | ICD-10-CM | POA: Diagnosis not present

## 2019-04-20 DIAGNOSIS — K222 Esophageal obstruction: Secondary | ICD-10-CM

## 2019-04-20 DIAGNOSIS — K551 Chronic vascular disorders of intestine: Secondary | ICD-10-CM | POA: Diagnosis not present

## 2019-04-20 DIAGNOSIS — K449 Diaphragmatic hernia without obstruction or gangrene: Secondary | ICD-10-CM

## 2019-04-20 DIAGNOSIS — Z800 Family history of malignant neoplasm of digestive organs: Secondary | ICD-10-CM | POA: Diagnosis not present

## 2019-04-20 NOTE — Progress Notes (Signed)
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  Was in her usual state of health prior to symptom onset.  Emergent EGD with food retrieval as outlined below.  Was seen in follow-up on 03/01/2019 with subsequent EGD on 03/08/2019 as below.  Longstanding history of reflux, which has otherwise been well controlled with Protonix 40 mg bid. Trialed going down to daily, but had breakthrough sxs. Rare use of Mylanta for prn breakthrough.   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  Last seen in the Vascular Clinic 01/2019.  Mesenteric duplex shows no significant stenoses.  Endoscopic History: -EGD (03/08/2019, Dr. Bryan Lemma): 1 cm stenosis at 35 cm dilated with 19 mm TTS balloon then fractured with forceps, 2 cm HH, benign fundic gland polyps -EGD (01/2019, Dr. Bryan Lemma; food impaction): Food bolus in lower one third removed, moderate intrinsic stenosis at 35 cm dilated with endoscope passage alone with 2 mucosal rents, 2 cm HH, gastric polyps -EGD (03/2012, Dr. Olevia Perches): 2 cm HH, mild gastritis -EGD (02/2008, Dr. Olevia Perches): Normal esophagus, empiric dilation with 16 mmSavary dilator without mucosal rent -EGD (a 02/2006, Dr. Olevia Perches): Normal esophagus, 15 mm gastric polyp -EGD (02/2001, Dr. Olevia Perches): Normal  -UGI series (09/2006): Small hiatal hernia, mild Schatzki's ring, mild esophageal dysmotility noted  HPI:     Patient is a 86 y.o. female presenting to the Gastroenterology Clinic for follow-up.  Last seen by me in 03/01/2019- was tolerating soft diet without any issues taking all medications as prescribed.  EGD on 03/08/2019 as above with TTS balloon dilation to 19 mm of esophageal stenosis that fractured with  forceps.  Recommended repeat EGD as needed along with follow-up with PCM/dental for denture fitting.  Today, she states she feels well since EGD with dil. No more dysphagia. Has stopped carafate and continues to take Protonix as prescribed.  No complaints today and feels well.  Has received first dose of Covid vaccine; second dose due on 3/26.  Planning on finding a new dentist for evaluation of new dentures.  Review of systems:     No chest pain, no SOB, no fevers, no urinary sx   Past Medical History:  Diagnosis Date  . Allergy    environmental per pt  . Arthritis   . Asthma   . Depression   . Elevated LFTs   . Esophageal dysmotility   . GERD (gastroesophageal reflux disease)   . Headache(784.0)   . Hiatal hernia   . Hyperplastic polyps of stomach   . Hypertension   . Hypothyroid   . Irritable bowel syndrome   . Macular degeneration   . Nausea & vomiting 04/02/2012  . Osteoporosis   . Shingles   . Sinusitis   . Tachycardia   . Vitamin D deficiency     Patient's surgical history, family medical history, social history, medications and allergies were all reviewed in Epic    Current Outpatient Medications  Medication Sig Dispense Refill  . acetaminophen (TYLENOL) 500 MG tablet Take 500 mg by mouth every 6 (six) hours as needed for pain.    . Albuterol Sulfate (PROAIR HFA IN) Inhale 2 puffs into the lungs every 6 (six) hours as needed (for shortness of breath).     Marland Kitchen aspirin  81 MG tablet Take 81 mg by mouth daily.    Marland Kitchen atorvastatin (LIPITOR) 20 MG tablet Take 20 mg by mouth daily.    . cetirizine-pseudoephedrine (ZYRTEC-D) 5-120 MG per tablet Take 1 tablet by mouth daily.     . diphenhydrAMINE (BENADRYL) 25 mg capsule Take 25 mg by mouth every 6 (six) hours as needed.    . fluticasone (FLONASE) 50 MCG/ACT nasal spray Place 2 sprays into the nose daily. 1 g 0  . fluticasone furoate-vilanterol (BREO ELLIPTA) 200-25 MCG/INH AEPB Inhale 1 puff into the lungs daily.    Marland Kitchen  levothyroxine (SYNTHROID, LEVOTHROID) 88 MCG tablet Take 88 mcg by mouth daily before breakfast.     . metoprolol succinate (TOPROL-XL) 50 MG 24 hr tablet TAKE ONE TABLET BY MOUTH ONE TIME DAILY 30 tablet 5  . montelukast (SINGULAIR) 10 MG tablet Take 10 mg by mouth at bedtime.    . Multiple Vitamins-Minerals (CENTRUM SILVER 50+WOMEN PO) Take 1 tablet by mouth daily.    . Multiple Vitamins-Minerals (PRESERVISION AREDS 2 PO) Take 1 tablet by mouth 2 (two) times daily.     . pantoprazole (PROTONIX) 40 MG tablet Take 1 tablet (40 mg total) by mouth 2 (two) times daily. 180 tablet 5  . triamcinolone cream (KENALOG) 0.5 %     . Vitamin D, Ergocalciferol, (DRISDOL) 50000 UNITS CAPS Take 50,000 Units by mouth every 7 (seven) days.    . sucralfate (CARAFATE) 1 GM/10ML suspension Take 10 mLs (1 g total) by mouth 4 (four) times daily. (Patient not taking: Reported on 03/01/2019) 420 mL 1   No current facility-administered medications for this visit.    Physical Exam:     BP 130/78   Pulse 60   Temp 98.4 F (36.9 C)   Ht 5\' 6"  (1.676 m)   Wt 136 lb 8 oz (61.9 kg)   BMI 22.03 kg/m   GENERAL:  Pleasant female in NAD PSYCH: : Cooperative, normal affect EENT:  conjunctiva pink, mucous membranes moist, NEURO: Alert and oriented x 3, no focal neurologic deficits   IMPRESSION and PLAN:    1) Esophageal Stenosis 2) History of food impaction -Resolution dysphagia with recent EGD with dilation. -Encouraged to follow-up about ill fitting dentures for possible refitting -Continue to cut food into small pieces, chew thoroughly with plenty of fluids with meals -RTC if any return of symptoms which could prompt repeat EGD with dilation as needed  3) GERD 4) Hiatal hernia -Reflux symptoms well controlled on current therapy -Resume Protonix 40 mg bid. Has had breakthrough reflux whenever she has trialed titration down to daily dosing.  After discussion regarding risks/ADRs of PPIs, she would like to  continue with current therapy  5) Family history of Esophageal Cancer -Brother died of esophageal cancer in his 51s.    No e/o EAC on her EGDs  6) History of chronic Mesenteric Ischemia -Resolved after SMA stent placement 2014.  No recurrence.  Recent ultrasound with patency     RTC prn      Lavena Bullion ,DO, FACG 04/20/2019, 3:12 PM

## 2019-04-20 NOTE — Patient Instructions (Signed)
Return as needed.   It was a pleasure to see you today!  Vito Cirigliano, D.O.  

## 2019-05-03 ENCOUNTER — Ambulatory Visit: Payer: Medicare Other | Admitting: Gastroenterology

## 2019-05-07 DIAGNOSIS — Z23 Encounter for immunization: Secondary | ICD-10-CM | POA: Diagnosis not present

## 2019-07-21 DIAGNOSIS — H40012 Open angle with borderline findings, low risk, left eye: Secondary | ICD-10-CM | POA: Diagnosis not present

## 2019-07-21 DIAGNOSIS — H401411 Capsular glaucoma with pseudoexfoliation of lens, right eye, mild stage: Secondary | ICD-10-CM | POA: Diagnosis not present

## 2019-08-11 DIAGNOSIS — H40012 Open angle with borderline findings, low risk, left eye: Secondary | ICD-10-CM | POA: Diagnosis not present

## 2019-08-11 DIAGNOSIS — H401411 Capsular glaucoma with pseudoexfoliation of lens, right eye, mild stage: Secondary | ICD-10-CM | POA: Diagnosis not present

## 2019-09-01 DIAGNOSIS — H401411 Capsular glaucoma with pseudoexfoliation of lens, right eye, mild stage: Secondary | ICD-10-CM | POA: Diagnosis not present

## 2019-09-01 DIAGNOSIS — H18593 Other hereditary corneal dystrophies, bilateral: Secondary | ICD-10-CM | POA: Diagnosis not present

## 2019-09-01 DIAGNOSIS — H40012 Open angle with borderline findings, low risk, left eye: Secondary | ICD-10-CM | POA: Diagnosis not present

## 2019-11-28 DIAGNOSIS — Z23 Encounter for immunization: Secondary | ICD-10-CM | POA: Diagnosis not present

## 2019-12-28 DIAGNOSIS — R7301 Impaired fasting glucose: Secondary | ICD-10-CM | POA: Diagnosis not present

## 2019-12-28 DIAGNOSIS — E559 Vitamin D deficiency, unspecified: Secondary | ICD-10-CM | POA: Diagnosis not present

## 2019-12-28 DIAGNOSIS — E039 Hypothyroidism, unspecified: Secondary | ICD-10-CM | POA: Diagnosis not present

## 2019-12-28 DIAGNOSIS — E781 Pure hyperglyceridemia: Secondary | ICD-10-CM | POA: Diagnosis not present

## 2019-12-31 DIAGNOSIS — Z23 Encounter for immunization: Secondary | ICD-10-CM | POA: Diagnosis not present

## 2020-01-04 DIAGNOSIS — J45909 Unspecified asthma, uncomplicated: Secondary | ICD-10-CM | POA: Diagnosis not present

## 2020-01-04 DIAGNOSIS — Z1339 Encounter for screening examination for other mental health and behavioral disorders: Secondary | ICD-10-CM | POA: Diagnosis not present

## 2020-01-04 DIAGNOSIS — Z0000 Encounter for general adult medical examination without abnormal findings: Secondary | ICD-10-CM | POA: Diagnosis not present

## 2020-01-04 DIAGNOSIS — R82998 Other abnormal findings in urine: Secondary | ICD-10-CM | POA: Diagnosis not present

## 2020-01-04 DIAGNOSIS — I10 Essential (primary) hypertension: Secondary | ICD-10-CM | POA: Diagnosis not present

## 2020-01-04 DIAGNOSIS — K551 Chronic vascular disorders of intestine: Secondary | ICD-10-CM | POA: Diagnosis not present

## 2020-01-04 DIAGNOSIS — N1831 Chronic kidney disease, stage 3a: Secondary | ICD-10-CM | POA: Diagnosis not present

## 2020-01-04 DIAGNOSIS — Z1331 Encounter for screening for depression: Secondary | ICD-10-CM | POA: Diagnosis not present

## 2020-01-04 DIAGNOSIS — G8929 Other chronic pain: Secondary | ICD-10-CM | POA: Diagnosis not present

## 2020-01-04 DIAGNOSIS — M810 Age-related osteoporosis without current pathological fracture: Secondary | ICD-10-CM | POA: Diagnosis not present

## 2020-01-04 DIAGNOSIS — M19041 Primary osteoarthritis, right hand: Secondary | ICD-10-CM | POA: Diagnosis not present

## 2020-01-04 DIAGNOSIS — M545 Low back pain, unspecified: Secondary | ICD-10-CM | POA: Diagnosis not present

## 2020-01-04 DIAGNOSIS — R7301 Impaired fasting glucose: Secondary | ICD-10-CM | POA: Diagnosis not present

## 2020-01-04 DIAGNOSIS — K219 Gastro-esophageal reflux disease without esophagitis: Secondary | ICD-10-CM | POA: Diagnosis not present

## 2020-01-04 DIAGNOSIS — F3342 Major depressive disorder, recurrent, in full remission: Secondary | ICD-10-CM | POA: Diagnosis not present

## 2020-02-02 ENCOUNTER — Ambulatory Visit (INDEPENDENT_AMBULATORY_CARE_PROVIDER_SITE_OTHER): Payer: Medicare Other | Admitting: Physician Assistant

## 2020-02-02 ENCOUNTER — Ambulatory Visit (HOSPITAL_COMMUNITY)
Admission: RE | Admit: 2020-02-02 | Discharge: 2020-02-02 | Disposition: A | Discharge status: Home / Self Care | Payer: Medicare Other | Source: Ambulatory Visit | Attending: Internal Medicine | Admitting: Internal Medicine

## 2020-02-02 ENCOUNTER — Other Ambulatory Visit: Payer: Self-pay

## 2020-02-02 VITALS — BP 162/90 | HR 87 | Temp 98.0°F | Wt 138.1 lb

## 2020-02-02 DIAGNOSIS — K551 Chronic vascular disorders of intestine: Secondary | ICD-10-CM

## 2020-02-02 NOTE — Progress Notes (Signed)
Established Mesenteric Ischemia   History of Present Illness   Megan Foster is a 86 y.o. (05-20-1927) female who presents to go over vascular studies related to chronic mesenteric artery stenosis.  She underwent balloon angioplasty of the SMA by Dr. Imogene Burn on 04/16/2012.  At the time she was symptomatic and had lost 30 pounds with concurrent postprandial pain.  Patient states that since that procedure she has not had any of the symptoms return.  She admittedly has not been eating much however this is related to history of esophageal stricture.  She had her esophagus stretched in January 2021.  Patient states she mainly eats a soft diet and drinks protein shakes for extra nutrients.  She also denies any claudication, rest pain, or tissue changes of bilateral lower extremities.  She denies tobacco use.  She is on aspirin and statin daily.  Patient states she has also been depressed since her daughter passed away from melanoma last year.  She is accompanied to her appointment today by her sister.  Patient states she had considered grief counseling however believes she is doing better as time goes on.  Current Outpatient Medications  Medication Sig Dispense Refill  . acetaminophen (TYLENOL) 500 MG tablet Take 500 mg by mouth every 6 (six) hours as needed for pain.    . Albuterol Sulfate (PROAIR HFA IN) Inhale 2 puffs into the lungs every 6 (six) hours as needed (for shortness of breath).     Marland Kitchen aspirin 81 MG tablet Take 81 mg by mouth daily.    Marland Kitchen atorvastatin (LIPITOR) 20 MG tablet Take 20 mg by mouth daily.    . cetirizine-pseudoephedrine (ZYRTEC-D) 5-120 MG per tablet Take 1 tablet by mouth daily.     . diphenhydrAMINE (BENADRYL) 25 mg capsule Take 25 mg by mouth every 6 (six) hours as needed.    . fluticasone furoate-vilanterol (BREO ELLIPTA) 200-25 MCG/INH AEPB Inhale 1 puff into the lungs daily.    Marland Kitchen levothyroxine (SYNTHROID, LEVOTHROID) 88 MCG tablet Take 88 mcg by mouth daily before breakfast.      . metoprolol succinate (TOPROL-XL) 50 MG 24 hr tablet TAKE ONE TABLET BY MOUTH ONE TIME DAILY 30 tablet 5  . montelukast (SINGULAIR) 10 MG tablet Take 10 mg by mouth at bedtime.    . Multiple Vitamins-Minerals (CENTRUM SILVER 50+WOMEN PO) Take 1 tablet by mouth daily.    . Multiple Vitamins-Minerals (PRESERVISION AREDS 2 PO) Take 1 tablet by mouth 2 (two) times daily.     . pantoprazole (PROTONIX) 40 MG tablet Take 1 tablet (40 mg total) by mouth 2 (two) times daily. 180 tablet 5  . triamcinolone cream (KENALOG) 0.5 %     . Vitamin D, Ergocalciferol, (DRISDOL) 50000 UNITS CAPS Take 50,000 Units by mouth every 7 (seven) days.    . fluticasone (FLONASE) 50 MCG/ACT nasal spray Place 2 sprays into the nose daily. 1 g 0  . sucralfate (CARAFATE) 1 GM/10ML suspension Take 10 mLs (1 g total) by mouth 4 (four) times daily. (Patient not taking: Reported on 03/01/2019) 420 mL 1   No current facility-administered medications for this visit.    REVIEW OF SYSTEMS (negative unless checked):   Cardiac:  []  Chest pain or chest pressure? []  Shortness of breath upon activity? []  Shortness of breath when lying flat? []  Irregular heart rhythm?  Vascular:  []  Pain in calf, thigh, or hip brought on by walking? []  Pain in feet at night that wakes you up from your sleep? []   Blood clot in your veins? []  Leg swelling?  Pulmonary:  []  Oxygen at home? []  Productive cough? []  Wheezing?  Neurologic:  []  Sudden weakness in arms or legs? []  Sudden numbness in arms or legs? []  Sudden onset of difficult speaking or slurred speech? []  Temporary loss of vision in one eye? []  Problems with dizziness?  Gastrointestinal:  []  Blood in stool? []  Vomited blood?  Genitourinary:  []  Burning when urinating? []  Blood in urine?  Psychiatric:  []  Major depression  Hematologic:  []  Bleeding problems? []  Problems with blood clotting?  Dermatologic:  []  Rashes or ulcers?  Constitutional:  []  Fever or  chills?  Ear/Nose/Throat:  []  Change in hearing? []  Nose bleeds? []  Sore throat?  Musculoskeletal:  []  Back pain? []  Joint pain? []  Muscle pain?   Physical Examination   Vitals:   02/02/20 0939  BP: (!) 162/90  Pulse: 87  Temp: 98 F (36.7 C)  TempSrc: Skin  SpO2: 98%  Weight: 138 lb 1.6 oz (62.6 kg)   Body mass index is 22.29 kg/m.  General:  WDWN in NAD; vital signs documented above Gait: Not observed HENT: WNL, normocephalic Pulmonary: normal non-labored breathing , without Rales, rhonchi,  wheezing Cardiac: regular HR Abdomen: soft, NT, no masses Skin: without rashes Vascular Exam/Pulses:  Right Left  Radial 2+ (normal) 2+ (normal)  DP absent 2+ (normal)  PT absent absent   Extremities: without ischemic changes, without Gangrene , without cellulitis; without open wounds;  Musculoskeletal: no muscle wasting or atrophy  Neurologic: A&O X 3;  No focal weakness or paresthesias are detected Psychiatric:  The pt has Normal affect.  Non-Invasive Vascular Imaging   Mesenteric Duplex :    SMA: origin 240 c/s; proximal vessel 329 c/s    Medical Decision Making   Megan Foster is a 86 y.o. female who presents with chronic mesenteric ischemia with history of SMA balloon angioplasty   Patient remains asymptomatic however there has been an increase in velocities over the past year in the proximal SMA  I offered the patient a 40-month follow-up for closer surveillance however she would prefer to keep annual follow up.  I explained the significance of acute mesenteric ischemia if this were to occur and both the patient and her sister understand.  Patient also states she would likely not want to pursue any invasive measures at her age however she would still like to come for surveillance annually.  Continue aspirin and statin daily  She will call with any change in symptoms, questions, or concerns.  Otherwise we will recheck mesenteric duplex in 1 year     Dagoberto Ligas PA-C [facility] Office: [phone]  Clinic MD: Dr. Scot Dock

## 2020-02-15 DIAGNOSIS — H1045 Other chronic allergic conjunctivitis: Secondary | ICD-10-CM | POA: Diagnosis not present

## 2020-02-15 DIAGNOSIS — J4530 Mild persistent asthma, uncomplicated: Secondary | ICD-10-CM | POA: Diagnosis not present

## 2020-02-15 DIAGNOSIS — J3089 Other allergic rhinitis: Secondary | ICD-10-CM | POA: Diagnosis not present

## 2020-04-01 ENCOUNTER — Other Ambulatory Visit: Payer: Self-pay | Admitting: Gastroenterology

## 2020-04-19 DIAGNOSIS — H401411 Capsular glaucoma with pseudoexfoliation of lens, right eye, mild stage: Secondary | ICD-10-CM | POA: Diagnosis not present

## 2020-04-19 DIAGNOSIS — H40012 Open angle with borderline findings, low risk, left eye: Secondary | ICD-10-CM | POA: Diagnosis not present

## 2020-08-02 DIAGNOSIS — Z23 Encounter for immunization: Secondary | ICD-10-CM | POA: Diagnosis not present

## 2020-09-10 DIAGNOSIS — E785 Hyperlipidemia, unspecified: Secondary | ICD-10-CM | POA: Diagnosis not present

## 2020-09-10 DIAGNOSIS — I129 Hypertensive chronic kidney disease with stage 1 through stage 4 chronic kidney disease, or unspecified chronic kidney disease: Secondary | ICD-10-CM | POA: Diagnosis not present

## 2020-09-10 DIAGNOSIS — N1831 Chronic kidney disease, stage 3a: Secondary | ICD-10-CM | POA: Diagnosis not present

## 2020-09-10 DIAGNOSIS — M810 Age-related osteoporosis without current pathological fracture: Secondary | ICD-10-CM | POA: Diagnosis not present

## 2020-10-11 DIAGNOSIS — N1831 Chronic kidney disease, stage 3a: Secondary | ICD-10-CM | POA: Diagnosis not present

## 2020-10-11 DIAGNOSIS — E785 Hyperlipidemia, unspecified: Secondary | ICD-10-CM | POA: Diagnosis not present

## 2020-10-11 DIAGNOSIS — M810 Age-related osteoporosis without current pathological fracture: Secondary | ICD-10-CM | POA: Diagnosis not present

## 2020-10-11 DIAGNOSIS — I129 Hypertensive chronic kidney disease with stage 1 through stage 4 chronic kidney disease, or unspecified chronic kidney disease: Secondary | ICD-10-CM | POA: Diagnosis not present

## 2020-10-12 DIAGNOSIS — Z20822 Contact with and (suspected) exposure to covid-19: Secondary | ICD-10-CM | POA: Diagnosis not present

## 2020-10-24 DIAGNOSIS — H353122 Nonexudative age-related macular degeneration, left eye, intermediate dry stage: Secondary | ICD-10-CM | POA: Diagnosis not present

## 2020-10-24 DIAGNOSIS — H40012 Open angle with borderline findings, low risk, left eye: Secondary | ICD-10-CM | POA: Diagnosis not present

## 2020-10-24 DIAGNOSIS — H26492 Other secondary cataract, left eye: Secondary | ICD-10-CM | POA: Diagnosis not present

## 2020-10-24 DIAGNOSIS — H18593 Other hereditary corneal dystrophies, bilateral: Secondary | ICD-10-CM | POA: Diagnosis not present

## 2020-10-24 DIAGNOSIS — H401411 Capsular glaucoma with pseudoexfoliation of lens, right eye, mild stage: Secondary | ICD-10-CM | POA: Diagnosis not present

## 2020-10-24 DIAGNOSIS — Z961 Presence of intraocular lens: Secondary | ICD-10-CM | POA: Diagnosis not present

## 2020-11-02 DIAGNOSIS — Z23 Encounter for immunization: Secondary | ICD-10-CM | POA: Diagnosis not present

## 2021-01-22 DIAGNOSIS — Z23 Encounter for immunization: Secondary | ICD-10-CM | POA: Diagnosis not present

## 2021-02-06 DIAGNOSIS — M810 Age-related osteoporosis without current pathological fracture: Secondary | ICD-10-CM | POA: Diagnosis not present

## 2021-02-06 DIAGNOSIS — E559 Vitamin D deficiency, unspecified: Secondary | ICD-10-CM | POA: Diagnosis not present

## 2021-02-06 DIAGNOSIS — R7301 Impaired fasting glucose: Secondary | ICD-10-CM | POA: Diagnosis not present

## 2021-02-06 DIAGNOSIS — I10 Essential (primary) hypertension: Secondary | ICD-10-CM | POA: Diagnosis not present

## 2021-02-06 DIAGNOSIS — E785 Hyperlipidemia, unspecified: Secondary | ICD-10-CM | POA: Diagnosis not present

## 2021-02-06 DIAGNOSIS — E039 Hypothyroidism, unspecified: Secondary | ICD-10-CM | POA: Diagnosis not present

## 2021-02-07 ENCOUNTER — Other Ambulatory Visit: Payer: Self-pay

## 2021-02-07 DIAGNOSIS — K551 Chronic vascular disorders of intestine: Secondary | ICD-10-CM

## 2021-02-08 DIAGNOSIS — N1831 Chronic kidney disease, stage 3a: Secondary | ICD-10-CM | POA: Diagnosis not present

## 2021-02-08 DIAGNOSIS — E785 Hyperlipidemia, unspecified: Secondary | ICD-10-CM | POA: Diagnosis not present

## 2021-02-08 DIAGNOSIS — Z0000 Encounter for general adult medical examination without abnormal findings: Secondary | ICD-10-CM | POA: Diagnosis not present

## 2021-02-08 DIAGNOSIS — Z1339 Encounter for screening examination for other mental health and behavioral disorders: Secondary | ICD-10-CM | POA: Diagnosis not present

## 2021-02-08 DIAGNOSIS — Z1331 Encounter for screening for depression: Secondary | ICD-10-CM | POA: Diagnosis not present

## 2021-02-08 DIAGNOSIS — K551 Chronic vascular disorders of intestine: Secondary | ICD-10-CM | POA: Diagnosis not present

## 2021-02-08 DIAGNOSIS — R7301 Impaired fasting glucose: Secondary | ICD-10-CM | POA: Diagnosis not present

## 2021-02-08 DIAGNOSIS — J45909 Unspecified asthma, uncomplicated: Secondary | ICD-10-CM | POA: Diagnosis not present

## 2021-02-08 DIAGNOSIS — E039 Hypothyroidism, unspecified: Secondary | ICD-10-CM | POA: Diagnosis not present

## 2021-02-08 DIAGNOSIS — I10 Essential (primary) hypertension: Secondary | ICD-10-CM | POA: Diagnosis not present

## 2021-02-08 DIAGNOSIS — G4700 Insomnia, unspecified: Secondary | ICD-10-CM | POA: Diagnosis not present

## 2021-02-08 DIAGNOSIS — M810 Age-related osteoporosis without current pathological fracture: Secondary | ICD-10-CM | POA: Diagnosis not present

## 2021-02-08 DIAGNOSIS — I129 Hypertensive chronic kidney disease with stage 1 through stage 4 chronic kidney disease, or unspecified chronic kidney disease: Secondary | ICD-10-CM | POA: Diagnosis not present

## 2021-02-08 DIAGNOSIS — E781 Pure hyperglyceridemia: Secondary | ICD-10-CM | POA: Diagnosis not present

## 2021-02-08 DIAGNOSIS — E559 Vitamin D deficiency, unspecified: Secondary | ICD-10-CM | POA: Diagnosis not present

## 2021-02-08 DIAGNOSIS — R82998 Other abnormal findings in urine: Secondary | ICD-10-CM | POA: Diagnosis not present

## 2021-02-13 DIAGNOSIS — J3089 Other allergic rhinitis: Secondary | ICD-10-CM | POA: Diagnosis not present

## 2021-02-13 DIAGNOSIS — H1045 Other chronic allergic conjunctivitis: Secondary | ICD-10-CM | POA: Diagnosis not present

## 2021-02-13 DIAGNOSIS — J4530 Mild persistent asthma, uncomplicated: Secondary | ICD-10-CM | POA: Diagnosis not present

## 2021-02-20 ENCOUNTER — Other Ambulatory Visit: Payer: Self-pay

## 2021-02-20 ENCOUNTER — Ambulatory Visit (HOSPITAL_COMMUNITY)
Admission: RE | Admit: 2021-02-20 | Discharge: 2021-02-20 | Disposition: A | Discharge status: Home / Self Care | Payer: Medicare Other | Source: Ambulatory Visit | Attending: Vascular Surgery | Admitting: Vascular Surgery

## 2021-02-20 ENCOUNTER — Ambulatory Visit (INDEPENDENT_AMBULATORY_CARE_PROVIDER_SITE_OTHER): Payer: Medicare Other | Admitting: Physician Assistant

## 2021-02-20 VITALS — BP 139/69 | HR 76 | Temp 97.9°F | Resp 18 | Ht 65.5 in | Wt 142.3 lb

## 2021-02-20 DIAGNOSIS — K551 Chronic vascular disorders of intestine: Secondary | ICD-10-CM

## 2021-02-20 NOTE — Progress Notes (Signed)
Office Note     CC:  follow up Requesting Provider:  Ginger Organ., MD  HPI: Megan Foster is a 88 y.o. (03-10-1927) female who presents to go over vascular studies related to chronic mesenteric artery stenosis.  She underwent balloon angioplasty of the SMA by Dr. Vallarie Mare on 04/16/2012.  This was performed due to postprandial pain with a 30 pound weight loss.  Since that procedure she states she does not have any postprandial pain or other symptom return.  At last office visit she was grieving the death of her daughter who passed away due to melanoma.  She states she is doing much better and does not feel depressed any longer.  She denies any claudication, rest pain, or nonhealing wounds of bilateral lower extremities.  She is on aspirin and a statin daily.   Past Medical History:  Diagnosis Date   Allergy    environmental per pt   Arthritis    Asthma    Depression    Elevated LFTs    Esophageal dysmotility    GERD (gastroesophageal reflux disease)    Headache(784.0)    Hiatal hernia    Hyperplastic polyps of stomach    Hypertension    Hypothyroid    Irritable bowel syndrome    Macular degeneration    Nausea & vomiting 04/02/2012   Osteoporosis    Shingles    Sinusitis    Tachycardia    Vitamin D deficiency     Past Surgical History:  Procedure Laterality Date   AORTOGRAM  04/16/12   COLONOSCOPY  2008   normal   ESOPHAGOGASTRODUODENOSCOPY  2014   multiple    ESOPHAGOGASTRODUODENOSCOPY Left 01/30/2019   Procedure: ESOPHAGOGASTRODUODENOSCOPY (EGD);  Surgeon: Lavena Bullion, DO;  Location: [facility];  Service: Gastroenterology;  Laterality: Left;   FOREIGN BODY REMOVAL  01/30/2019   Procedure: FOREIGN BODY REMOVAL;  Surgeon: Lavena Bullion, DO;  Location: [facility];  Service: Gastroenterology;;   MULTIPLE TOOTH EXTRACTIONS     TOTAL ABDOMINAL HYSTERECTOMY  1999   UPPER GASTROINTESTINAL ENDOSCOPY      Social History   Socioeconomic History   Marital  status: Widowed    Spouse name: Not on file   Number of children: 1   Years of education: Not on file   Highest education level: Not on file  Occupational History   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    Comment: Retired Marine scientist  Tobacco Use   Smoking status: Never   Smokeless tobacco: Never  Vaping Use   Vaping Use: Never used  Substance and Sexual Activity   Alcohol use: No    Alcohol/week: 0.0 standard drinks    Comment: <1 day   Drug use: No   Sexual activity: Not on file  Other Topics Concern   Not on file  Social History Narrative   Not on file   Social Determinants of Health   Financial Resource Strain: Not on file  Food Insecurity: Not on file  Transportation Needs: Not on file  Physical Activity: Not on file  Stress: Not on file  Social Connections: Not on file  Intimate Partner Violence: Not on file    Family History  Problem Relation Age of Onset   Hypertension Mother    Heart attack Mother    Heart attack Father 44   Hypertension Father    Peripheral vascular disease Father    Heart failure Sister    Cancer Sister    Hypertension Brother  Cancer Brother    Hypertension Brother    Breast cancer Sister    Ovarian cancer Other        mat cousin   Prostate cancer Maternal Uncle    Colon polyps Sister        siblings   Colon cancer Other        Maternal Aunt x3 Maternal Uncle x 2   Esophageal cancer Brother        died at 51   Kidney disease Brother    Stomach cancer Neg Hx    Rectal cancer Neg Hx     Current Outpatient Medications  Medication Sig Dispense Refill   acetaminophen (TYLENOL) 500 MG tablet Take 500 mg by mouth every 6 (six) hours as needed for pain.     Albuterol Sulfate (PROAIR HFA IN) Inhale 2 puffs into the lungs every 6 (six) hours as needed (for shortness of breath).      aspirin 81 MG tablet Take 81 mg by mouth daily.     atorvastatin (LIPITOR) 20 MG tablet Take 20 mg by mouth daily.     cetirizine-pseudoephedrine  (ZYRTEC-D) 5-120 MG per tablet Take 1 tablet by mouth daily.      diphenhydrAMINE (BENADRYL) 25 mg capsule Take 25 mg by mouth every 6 (six) hours as needed.     fluticasone furoate-vilanterol (BREO ELLIPTA) 200-25 MCG/INH AEPB Inhale 1 puff into the lungs daily.     levothyroxine (SYNTHROID, LEVOTHROID) 88 MCG tablet Take 88 mcg by mouth daily before breakfast.      metoprolol succinate (TOPROL-XL) 50 MG 24 hr tablet TAKE ONE TABLET BY MOUTH ONE TIME DAILY 30 tablet 5   montelukast (SINGULAIR) 10 MG tablet Take 10 mg by mouth at bedtime.     Multiple Vitamins-Minerals (CENTRUM SILVER 50+WOMEN PO) Take 1 tablet by mouth daily.     Multiple Vitamins-Minerals (PRESERVISION AREDS 2 PO) Take 1 tablet by mouth 2 (two) times daily.      pantoprazole (PROTONIX) 40 MG tablet TAKE 1 TABLET BY MOUTH 2 TIMES DAILY 180 tablet 4   triamcinolone cream (KENALOG) 0.5 %      Vitamin D, Ergocalciferol, (DRISDOL) 50000 UNITS CAPS Take 50,000 Units by mouth every 7 (seven) days.     fluticasone (FLONASE) 50 MCG/ACT nasal spray Place 2 sprays into the nose daily. 1 g 0   sucralfate (CARAFATE) 1 GM/10ML suspension Take 10 mLs (1 g total) by mouth 4 (four) times daily. (Patient not taking: Reported on 03/01/2019) 420 mL 1   No current facility-administered medications for this visit.    No Known Allergies   REVIEW OF SYSTEMS:  [X]  denotes positive finding, [ ]  denotes negative finding Cardiac  Comments:  Chest pain or chest pressure:    Shortness of breath upon exertion:    Short of breath when lying flat:    Irregular heart rhythm:        Vascular    Pain in calf, thigh, or hip brought on by ambulation:    Pain in feet at night that wakes you up from your sleep:     Blood clot in your veins:    Leg swelling:         Pulmonary    Oxygen at home:    Productive cough:     Wheezing:         Neurologic    Sudden weakness in arms or legs:     Sudden numbness in arms or legs:  Sudden onset of  difficulty speaking or slurred speech:    Temporary loss of vision in one eye:     Problems with dizziness:         Gastrointestinal    Blood in stool:     Vomited blood:         Genitourinary    Burning when urinating:     Blood in urine:        Psychiatric    Major depression:         Hematologic    Bleeding problems:    Problems with blood clotting too easily:        Skin    Rashes or ulcers:        Constitutional    Fever or chills:      PHYSICAL EXAMINATION:  Vitals:   02/20/21 0947  BP: 139/69  Pulse: 76  Resp: 18  Temp: 97.9 F (36.6 C)  TempSrc: Temporal  SpO2: 99%  Weight: 142 lb 4.8 oz (64.5 kg)  Height: 5' 5.5" (1.664 m)    General:  WDWN in NAD; vital signs documented above Gait: Not observed HENT: WNL, normocephalic Pulmonary: normal non-labored breathing Cardiac: regular HR Abdomen: soft, NT, no masses Skin: without rashes Vascular Exam/Pulses:  Right Left  Radial 2+ (normal) 2+ (normal)   Extremities: without ischemic changes, without Gangrene , without cellulitis; without open wounds;  Musculoskeletal: no muscle wasting or atrophy  Neurologic: A&O X 3;  No focal weakness or paresthesias are detected Psychiatric:  The pt has Normal affect.   Non-Invasive Vascular Imaging:   Largely unchanged mesenteric artery duplex however proximal SMA velocities have increased slightly to 355 cm/s    ASSESSMENT/PLAN:: 88 y.o. female with history of balloon angioplasty of proximal SMA  -Patient denies any postprandial pain, fear of food, or additional weight loss -Proximal SMA stenosis increased again slightly to 355 cm/s.  Patient however remains asymptomatic thus we will not proceed with angiography at this time.  We will however shorten the surveillance interval from 1 year to 6 months.  She will call/return office sooner if she develops any of the above symptoms.  Patient states she would be willing to undergo mesenteric angiography if needed in  the future   Dagoberto Ligas, PA-C [facility] [phone]  Clinic MD:   Carlis Abbott

## 2021-02-23 ENCOUNTER — Other Ambulatory Visit: Payer: Self-pay | Admitting: *Deleted

## 2021-02-23 DIAGNOSIS — K551 Chronic vascular disorders of intestine: Secondary | ICD-10-CM

## 2021-02-27 DIAGNOSIS — Z20822 Contact with and (suspected) exposure to covid-19: Secondary | ICD-10-CM | POA: Diagnosis not present

## 2021-04-10 DIAGNOSIS — I10 Essential (primary) hypertension: Secondary | ICD-10-CM | POA: Diagnosis not present

## 2021-04-10 DIAGNOSIS — M810 Age-related osteoporosis without current pathological fracture: Secondary | ICD-10-CM | POA: Diagnosis not present

## 2021-04-10 DIAGNOSIS — N1831 Chronic kidney disease, stage 3a: Secondary | ICD-10-CM | POA: Diagnosis not present

## 2021-04-10 DIAGNOSIS — E785 Hyperlipidemia, unspecified: Secondary | ICD-10-CM | POA: Diagnosis not present

## 2021-04-24 DIAGNOSIS — H40012 Open angle with borderline findings, low risk, left eye: Secondary | ICD-10-CM | POA: Diagnosis not present

## 2021-04-24 DIAGNOSIS — H1045 Other chronic allergic conjunctivitis: Secondary | ICD-10-CM | POA: Diagnosis not present

## 2021-04-24 DIAGNOSIS — H401411 Capsular glaucoma with pseudoexfoliation of lens, right eye, mild stage: Secondary | ICD-10-CM | POA: Diagnosis not present

## 2021-05-01 DIAGNOSIS — Z20822 Contact with and (suspected) exposure to covid-19: Secondary | ICD-10-CM | POA: Diagnosis not present

## 2021-05-11 DIAGNOSIS — M810 Age-related osteoporosis without current pathological fracture: Secondary | ICD-10-CM | POA: Diagnosis not present

## 2021-05-11 DIAGNOSIS — N1831 Chronic kidney disease, stage 3a: Secondary | ICD-10-CM | POA: Diagnosis not present

## 2021-05-11 DIAGNOSIS — I10 Essential (primary) hypertension: Secondary | ICD-10-CM | POA: Diagnosis not present

## 2021-05-11 DIAGNOSIS — E785 Hyperlipidemia, unspecified: Secondary | ICD-10-CM | POA: Diagnosis not present

## 2021-05-11 DIAGNOSIS — Z20822 Contact with and (suspected) exposure to covid-19: Secondary | ICD-10-CM | POA: Diagnosis not present

## 2021-05-24 DIAGNOSIS — Z20822 Contact with and (suspected) exposure to covid-19: Secondary | ICD-10-CM | POA: Diagnosis not present

## 2021-06-01 DIAGNOSIS — Z20822 Contact with and (suspected) exposure to covid-19: Secondary | ICD-10-CM | POA: Diagnosis not present

## 2021-06-07 DIAGNOSIS — Z20822 Contact with and (suspected) exposure to covid-19: Secondary | ICD-10-CM | POA: Diagnosis not present

## 2021-06-13 DIAGNOSIS — Z20822 Contact with and (suspected) exposure to covid-19: Secondary | ICD-10-CM | POA: Diagnosis not present

## 2021-06-14 DIAGNOSIS — Z20822 Contact with and (suspected) exposure to covid-19: Secondary | ICD-10-CM | POA: Diagnosis not present

## 2021-06-15 DIAGNOSIS — Z20822 Contact with and (suspected) exposure to covid-19: Secondary | ICD-10-CM | POA: Diagnosis not present

## 2021-06-16 DIAGNOSIS — U071 COVID-19: Secondary | ICD-10-CM | POA: Diagnosis not present

## 2021-06-20 DIAGNOSIS — Z20822 Contact with and (suspected) exposure to covid-19: Secondary | ICD-10-CM | POA: Diagnosis not present

## 2021-10-25 DIAGNOSIS — H401411 Capsular glaucoma with pseudoexfoliation of lens, right eye, mild stage: Secondary | ICD-10-CM | POA: Diagnosis not present

## 2021-10-25 DIAGNOSIS — H1045 Other chronic allergic conjunctivitis: Secondary | ICD-10-CM | POA: Diagnosis not present

## 2021-10-25 DIAGNOSIS — H353122 Nonexudative age-related macular degeneration, left eye, intermediate dry stage: Secondary | ICD-10-CM | POA: Diagnosis not present

## 2021-10-25 DIAGNOSIS — H40012 Open angle with borderline findings, low risk, left eye: Secondary | ICD-10-CM | POA: Diagnosis not present

## 2021-10-25 DIAGNOSIS — H18593 Other hereditary corneal dystrophies, bilateral: Secondary | ICD-10-CM | POA: Diagnosis not present

## 2021-10-25 DIAGNOSIS — Z961 Presence of intraocular lens: Secondary | ICD-10-CM | POA: Diagnosis not present

## 2021-10-27 DIAGNOSIS — Z23 Encounter for immunization: Secondary | ICD-10-CM | POA: Diagnosis not present

## 2021-11-08 DIAGNOSIS — H401411 Capsular glaucoma with pseudoexfoliation of lens, right eye, mild stage: Secondary | ICD-10-CM | POA: Diagnosis not present

## 2021-11-20 DIAGNOSIS — Z23 Encounter for immunization: Secondary | ICD-10-CM | POA: Diagnosis not present

## 2021-12-03 NOTE — Progress Notes (Signed)
Office Note   History of Present Illness   Megan Foster is a 88 y.o. (November 15, 1927) female who presents for follow up of chronic mesenteric artery stenosis.  She underwent balloon angioplasty of her SMA by Dr. Vallarie Mare on 04/16/2012.  This was performed due to SMA stenosis with postprandial pain and 30 pound weight loss.  She has been on aspirin and statin daily.  At her last follow-up with [place] on 02/20/2021, she was not having any symptoms such as weight loss or post prandial pain. Vascular studies did reveal proximal SMA stenosis with velocities up to 355 cm/s. She was to follow up in 6 months to monitor this stenosis. She is willing to undergo mesenteric angiography if needed in the future.  Today at follow up she reports no new issues. She is still eating multiple small meals daily without pain. She has no nausea. She has not lost any weight.  Current Outpatient Medications  Medication Sig Dispense Refill   acetaminophen (TYLENOL) 500 MG tablet Take 500 mg by mouth every 6 (six) hours as needed for pain.     Albuterol Sulfate (PROAIR HFA IN) Inhale 2 puffs into the lungs every 6 (six) hours as needed (for shortness of breath).      aspirin 81 MG tablet Take 81 mg by mouth daily.     atorvastatin (LIPITOR) 20 MG tablet Take 20 mg by mouth daily.     cetirizine-pseudoephedrine (ZYRTEC-D) 5-120 MG per tablet Take 1 tablet by mouth daily.      diphenhydrAMINE (BENADRYL) 25 mg capsule Take 25 mg by mouth every 6 (six) hours as needed.     fluticasone (FLONASE) 50 MCG/ACT nasal spray Place 2 sprays into the nose daily. 1 g 0   fluticasone furoate-vilanterol (BREO ELLIPTA) 200-25 MCG/INH AEPB Inhale 1 puff into the lungs daily.     levothyroxine (SYNTHROID, LEVOTHROID) 88 MCG tablet Take 88 mcg by mouth daily before breakfast.      metoprolol succinate (TOPROL-XL) 50 MG 24 hr tablet TAKE ONE TABLET BY MOUTH ONE TIME DAILY 30 tablet 5   montelukast (SINGULAIR) 10 MG tablet Take 10 mg by mouth at  bedtime.     Multiple Vitamins-Minerals (CENTRUM SILVER 50+WOMEN PO) Take 1 tablet by mouth daily.     Multiple Vitamins-Minerals (PRESERVISION AREDS 2 PO) Take 1 tablet by mouth 2 (two) times daily.      pantoprazole (PROTONIX) 40 MG tablet TAKE 1 TABLET BY MOUTH 2 TIMES DAILY 180 tablet 4   sucralfate (CARAFATE) 1 GM/10ML suspension Take 10 mLs (1 g total) by mouth 4 (four) times daily. (Patient not taking: Reported on 03/01/2019) 420 mL 1   triamcinolone cream (KENALOG) 0.5 %      Vitamin D, Ergocalciferol, (DRISDOL) 50000 UNITS CAPS Take 50,000 Units by mouth every 7 (seven) days.     No current facility-administered medications for this visit.    REVIEW OF SYSTEMS (negative unless checked):   Cardiac:  '[]'$  Chest pain or chest pressure? '[]'$  Shortness of breath upon activity? '[]'$  Shortness of breath when lying flat? '[]'$  Irregular heart rhythm?  Vascular:  '[]'$  Pain in calf, thigh, or hip brought on by walking? '[]'$  Pain in feet at night that wakes you up from your sleep? '[]'$  Blood clot in your veins? '[]'$  Leg swelling?  Pulmonary:  '[]'$  Oxygen at home? '[]'$  Productive cough? '[]'$  Wheezing?  Neurologic:  '[]'$  Sudden weakness in arms or legs? '[]'$  Sudden numbness in arms or legs? '[]'$  Sudden onset  of difficult speaking or slurred speech? '[]'$  Temporary loss of vision in one eye? '[]'$  Problems with dizziness?  Gastrointestinal:  '[]'$  Blood in stool? '[]'$  Vomited blood?  Genitourinary:  '[]'$  Burning when urinating? '[]'$  Blood in urine?  Psychiatric:  '[]'$  Major depression  Hematologic:  '[]'$  Bleeding problems? '[]'$  Problems with blood clotting?  Dermatologic:  '[]'$  Rashes or ulcers?  Constitutional:  '[]'$  Fever or chills?  Ear/Nose/Throat:  '[]'$  Change in hearing? '[]'$  Nose bleeds? '[]'$  Sore throat?  Musculoskeletal:  '[]'$  Back pain? '[]'$  Joint pain? '[]'$  Muscle pain?   Physical Examination   Vitals:   12/04/21 1117  BP: (!) 170/88  Pulse: 82  Resp: 20  Temp: 98.4 F (36.9 C)  SpO2: 97%  Weight:  142 lb (64.4 kg)  Height: '5\' 5"'$  (1.651 m)   Body mass index is 23.63 kg/m.  General:  WDWN in NAD; vital signs documented above Gait: Not observed HENT: WNL, normocephalic Pulmonary: normal non-labored breathing , without rales, rhonchi,  wheezing Cardiac: regular HR, without murmurs without carotid bruit Abdomen: soft, NT, no masses Skin: without rashes Vascular Exam/Pulses: bilateral feet warm and well perfused Extremities: without ischemic changes, without gangrene , without cellulitis; without open wounds;  Musculoskeletal: no muscle wasting or atrophy  Neurologic: A&O X 3;  No focal weakness or paresthesias are detected Psychiatric:  The pt has Normal affect.  Non-Invasive Vascular Imaging   Mesenteric Duplex (12/04/21):   +--------------------+--------+--------+------+--------+  Mesenteric          PSV cm/sEDV cm/sPlaqueComments  +--------------------+--------+--------+------+--------+  Aorta Prox             87                           +--------------------+--------+--------+------+--------+  Celiac Artery Origin  258                           +--------------------+--------+--------+------+--------+  SMA Origin            120      13                   +--------------------+--------+--------+------+--------+  SMA Proximal          243      24                   +--------------------+--------+--------+------+--------+  SMA Mid               227      17                   +--------------------+--------+--------+------+--------+  SMA Distal            106      0                    +--------------------+--------+--------+------+--------+      Summary:  Mesenteric:     Velocities in the celiac artery are just into the >70% stenosis range.  Velicocities in the proximal SMA are in the >70% range. Unable to obtain  velocities in the 300cm/s range as was seen on the previous exam.   Medical Decision Making   [name] is a  88 y.o. female who presents with asymptomatic mesenteric stenosis with history of proximal SMA angioplasty  Based on the patient's vascular studies, she is on the border of 70% stenosis of the proximal SMA. Highest  velocity today at 243, and at her previous visit it was 355. She is still eating well without nausea, postprandial pain, or weight loss. She will continue her aspirin and statin She can follow up with [place] in 6 months with repeat mesenteric duplex   Vicente Serene PA-C [facility]: [facility] MD: Scot Dock call MD

## 2021-12-04 ENCOUNTER — Ambulatory Visit (INDEPENDENT_AMBULATORY_CARE_PROVIDER_SITE_OTHER): Payer: Medicare Other | Admitting: Physician Assistant

## 2021-12-04 ENCOUNTER — Ambulatory Visit (HOSPITAL_COMMUNITY)
Admission: RE | Admit: 2021-12-04 | Discharge: 2021-12-04 | Disposition: A | Discharge status: Home / Self Care | Payer: Medicare Other | Source: Ambulatory Visit | Attending: Vascular Surgery | Admitting: Vascular Surgery

## 2021-12-04 VITALS — BP 170/88 | HR 82 | Temp 98.4°F | Resp 20 | Ht 65.0 in | Wt 142.0 lb

## 2021-12-04 DIAGNOSIS — K551 Chronic vascular disorders of intestine: Secondary | ICD-10-CM | POA: Diagnosis not present

## 2021-12-07 ENCOUNTER — Other Ambulatory Visit: Payer: Self-pay

## 2021-12-07 DIAGNOSIS — K551 Chronic vascular disorders of intestine: Secondary | ICD-10-CM

## 2022-02-22 DIAGNOSIS — E039 Hypothyroidism, unspecified: Secondary | ICD-10-CM | POA: Diagnosis not present

## 2022-02-22 DIAGNOSIS — R7301 Impaired fasting glucose: Secondary | ICD-10-CM | POA: Diagnosis not present

## 2022-02-22 DIAGNOSIS — I10 Essential (primary) hypertension: Secondary | ICD-10-CM | POA: Diagnosis not present

## 2022-02-22 DIAGNOSIS — R7989 Other specified abnormal findings of blood chemistry: Secondary | ICD-10-CM | POA: Diagnosis not present

## 2022-02-22 DIAGNOSIS — E559 Vitamin D deficiency, unspecified: Secondary | ICD-10-CM | POA: Diagnosis not present

## 2022-02-22 DIAGNOSIS — E785 Hyperlipidemia, unspecified: Secondary | ICD-10-CM | POA: Diagnosis not present

## 2022-03-01 DIAGNOSIS — J309 Allergic rhinitis, unspecified: Secondary | ICD-10-CM | POA: Diagnosis not present

## 2022-03-01 DIAGNOSIS — R7301 Impaired fasting glucose: Secondary | ICD-10-CM | POA: Diagnosis not present

## 2022-03-01 DIAGNOSIS — Z1331 Encounter for screening for depression: Secondary | ICD-10-CM | POA: Diagnosis not present

## 2022-03-01 DIAGNOSIS — E785 Hyperlipidemia, unspecified: Secondary | ICD-10-CM | POA: Diagnosis not present

## 2022-03-01 DIAGNOSIS — K551 Chronic vascular disorders of intestine: Secondary | ICD-10-CM | POA: Diagnosis not present

## 2022-03-01 DIAGNOSIS — I10 Essential (primary) hypertension: Secondary | ICD-10-CM | POA: Diagnosis not present

## 2022-03-01 DIAGNOSIS — Z0000 Encounter for general adult medical examination without abnormal findings: Secondary | ICD-10-CM | POA: Diagnosis not present

## 2022-03-01 DIAGNOSIS — N1831 Chronic kidney disease, stage 3a: Secondary | ICD-10-CM | POA: Diagnosis not present

## 2022-03-01 DIAGNOSIS — M810 Age-related osteoporosis without current pathological fracture: Secondary | ICD-10-CM | POA: Diagnosis not present

## 2022-03-01 DIAGNOSIS — Z1339 Encounter for screening examination for other mental health and behavioral disorders: Secondary | ICD-10-CM | POA: Diagnosis not present

## 2022-03-01 DIAGNOSIS — I129 Hypertensive chronic kidney disease with stage 1 through stage 4 chronic kidney disease, or unspecified chronic kidney disease: Secondary | ICD-10-CM | POA: Diagnosis not present

## 2022-03-01 DIAGNOSIS — G4700 Insomnia, unspecified: Secondary | ICD-10-CM | POA: Diagnosis not present

## 2022-03-01 DIAGNOSIS — R82998 Other abnormal findings in urine: Secondary | ICD-10-CM | POA: Diagnosis not present

## 2022-03-21 DIAGNOSIS — H401411 Capsular glaucoma with pseudoexfoliation of lens, right eye, mild stage: Secondary | ICD-10-CM | POA: Diagnosis not present

## 2022-03-21 DIAGNOSIS — Z961 Presence of intraocular lens: Secondary | ICD-10-CM | POA: Diagnosis not present

## 2022-03-21 DIAGNOSIS — H18593 Other hereditary corneal dystrophies, bilateral: Secondary | ICD-10-CM | POA: Diagnosis not present

## 2022-03-21 DIAGNOSIS — H40012 Open angle with borderline findings, low risk, left eye: Secondary | ICD-10-CM | POA: Diagnosis not present

## 2022-03-21 DIAGNOSIS — H1045 Other chronic allergic conjunctivitis: Secondary | ICD-10-CM | POA: Diagnosis not present

## 2022-04-02 DIAGNOSIS — J00 Acute nasopharyngitis [common cold]: Secondary | ICD-10-CM | POA: Diagnosis not present

## 2022-04-02 DIAGNOSIS — Z03818 Encounter for observation for suspected exposure to other biological agents ruled out: Secondary | ICD-10-CM | POA: Diagnosis not present

## 2022-05-23 DIAGNOSIS — H401411 Capsular glaucoma with pseudoexfoliation of lens, right eye, mild stage: Secondary | ICD-10-CM | POA: Diagnosis not present

## 2022-05-23 DIAGNOSIS — H18593 Other hereditary corneal dystrophies, bilateral: Secondary | ICD-10-CM | POA: Diagnosis not present

## 2022-05-23 DIAGNOSIS — H40012 Open angle with borderline findings, low risk, left eye: Secondary | ICD-10-CM | POA: Diagnosis not present

## 2022-05-25 ENCOUNTER — Emergency Department (HOSPITAL_COMMUNITY): Payer: Medicare Other

## 2022-05-25 ENCOUNTER — Encounter (HOSPITAL_COMMUNITY): Payer: Self-pay | Admitting: *Deleted

## 2022-05-25 ENCOUNTER — Inpatient Hospital Stay (HOSPITAL_COMMUNITY)
Admission: EM | Admit: 2022-05-25 | Discharge: 2022-05-29 | DRG: 522 | Disposition: A | Discharge status: Skilled Nursing Facility | Payer: Medicare Other | Attending: Family Medicine | Admitting: Family Medicine

## 2022-05-25 ENCOUNTER — Other Ambulatory Visit: Payer: Self-pay

## 2022-05-25 DIAGNOSIS — M47819 Spondylosis without myelopathy or radiculopathy, site unspecified: Secondary | ICD-10-CM | POA: Diagnosis present

## 2022-05-25 DIAGNOSIS — R54 Age-related physical debility: Secondary | ICD-10-CM | POA: Diagnosis present

## 2022-05-25 DIAGNOSIS — S72041A Displaced fracture of base of neck of right femur, initial encounter for closed fracture: Secondary | ICD-10-CM | POA: Diagnosis not present

## 2022-05-25 DIAGNOSIS — S72011A Unspecified intracapsular fracture of right femur, initial encounter for closed fracture: Secondary | ICD-10-CM | POA: Diagnosis not present

## 2022-05-25 DIAGNOSIS — E039 Hypothyroidism, unspecified: Secondary | ICD-10-CM | POA: Diagnosis not present

## 2022-05-25 DIAGNOSIS — I10 Essential (primary) hypertension: Secondary | ICD-10-CM | POA: Diagnosis present

## 2022-05-25 DIAGNOSIS — M4312 Spondylolisthesis, cervical region: Secondary | ICD-10-CM | POA: Diagnosis present

## 2022-05-25 DIAGNOSIS — I739 Peripheral vascular disease, unspecified: Secondary | ICD-10-CM | POA: Diagnosis not present

## 2022-05-25 DIAGNOSIS — J45909 Unspecified asthma, uncomplicated: Secondary | ICD-10-CM | POA: Diagnosis not present

## 2022-05-25 DIAGNOSIS — R002 Palpitations: Secondary | ICD-10-CM | POA: Diagnosis not present

## 2022-05-25 DIAGNOSIS — H409 Unspecified glaucoma: Secondary | ICD-10-CM | POA: Diagnosis present

## 2022-05-25 DIAGNOSIS — M810 Age-related osteoporosis without current pathological fracture: Secondary | ICD-10-CM | POA: Diagnosis present

## 2022-05-25 DIAGNOSIS — E876 Hypokalemia: Secondary | ICD-10-CM | POA: Diagnosis not present

## 2022-05-25 DIAGNOSIS — D6959 Other secondary thrombocytopenia: Secondary | ICD-10-CM | POA: Diagnosis not present

## 2022-05-25 DIAGNOSIS — R339 Retention of urine, unspecified: Secondary | ICD-10-CM | POA: Diagnosis present

## 2022-05-25 DIAGNOSIS — S0990XA Unspecified injury of head, initial encounter: Secondary | ICD-10-CM | POA: Diagnosis not present

## 2022-05-25 DIAGNOSIS — K551 Chronic vascular disorders of intestine: Secondary | ICD-10-CM | POA: Diagnosis present

## 2022-05-25 DIAGNOSIS — Z83719 Family history of colon polyps, unspecified: Secondary | ICD-10-CM

## 2022-05-25 DIAGNOSIS — Y92009 Unspecified place in unspecified non-institutional (private) residence as the place of occurrence of the external cause: Secondary | ICD-10-CM

## 2022-05-25 DIAGNOSIS — Z90710 Acquired absence of both cervix and uterus: Secondary | ICD-10-CM

## 2022-05-25 DIAGNOSIS — K224 Dyskinesia of esophagus: Secondary | ICD-10-CM | POA: Diagnosis present

## 2022-05-25 DIAGNOSIS — K219 Gastro-esophageal reflux disease without esophagitis: Secondary | ICD-10-CM | POA: Diagnosis not present

## 2022-05-25 DIAGNOSIS — Z043 Encounter for examination and observation following other accident: Secondary | ICD-10-CM | POA: Diagnosis not present

## 2022-05-25 DIAGNOSIS — Z79890 Hormone replacement therapy (postmenopausal): Secondary | ICD-10-CM

## 2022-05-25 DIAGNOSIS — W010XXA Fall on same level from slipping, tripping and stumbling without subsequent striking against object, initial encounter: Secondary | ICD-10-CM | POA: Diagnosis present

## 2022-05-25 DIAGNOSIS — S72001A Fracture of unspecified part of neck of right femur, initial encounter for closed fracture: Secondary | ICD-10-CM | POA: Diagnosis present

## 2022-05-25 DIAGNOSIS — Z7982 Long term (current) use of aspirin: Secondary | ICD-10-CM

## 2022-05-25 DIAGNOSIS — M9701XA Periprosthetic fracture around internal prosthetic right hip joint, initial encounter: Secondary | ICD-10-CM | POA: Diagnosis present

## 2022-05-25 DIAGNOSIS — R2681 Unsteadiness on feet: Secondary | ICD-10-CM | POA: Diagnosis not present

## 2022-05-25 DIAGNOSIS — E559 Vitamin D deficiency, unspecified: Secondary | ICD-10-CM | POA: Diagnosis not present

## 2022-05-25 DIAGNOSIS — D62 Acute posthemorrhagic anemia: Secondary | ICD-10-CM | POA: Diagnosis not present

## 2022-05-25 DIAGNOSIS — Z841 Family history of disorders of kidney and ureter: Secondary | ICD-10-CM

## 2022-05-25 DIAGNOSIS — M25572 Pain in left ankle and joints of left foot: Secondary | ICD-10-CM | POA: Diagnosis not present

## 2022-05-25 DIAGNOSIS — F32A Depression, unspecified: Secondary | ICD-10-CM | POA: Diagnosis present

## 2022-05-25 DIAGNOSIS — Z803 Family history of malignant neoplasm of breast: Secondary | ICD-10-CM

## 2022-05-25 DIAGNOSIS — Z79899 Other long term (current) drug therapy: Secondary | ICD-10-CM

## 2022-05-25 DIAGNOSIS — E785 Hyperlipidemia, unspecified: Secondary | ICD-10-CM | POA: Diagnosis not present

## 2022-05-25 DIAGNOSIS — R262 Difficulty in walking, not elsewhere classified: Secondary | ICD-10-CM | POA: Diagnosis not present

## 2022-05-25 DIAGNOSIS — S72001D Fracture of unspecified part of neck of right femur, subsequent encounter for closed fracture with routine healing: Secondary | ICD-10-CM | POA: Diagnosis not present

## 2022-05-25 DIAGNOSIS — Z8249 Family history of ischemic heart disease and other diseases of the circulatory system: Secondary | ICD-10-CM | POA: Diagnosis not present

## 2022-05-25 DIAGNOSIS — S199XXA Unspecified injury of neck, initial encounter: Secondary | ICD-10-CM | POA: Diagnosis not present

## 2022-05-25 DIAGNOSIS — W19XXXA Unspecified fall, initial encounter: Secondary | ICD-10-CM

## 2022-05-25 DIAGNOSIS — Z981 Arthrodesis status: Secondary | ICD-10-CM | POA: Diagnosis not present

## 2022-05-25 DIAGNOSIS — Z9181 History of falling: Secondary | ICD-10-CM | POA: Diagnosis not present

## 2022-05-25 DIAGNOSIS — Z471 Aftercare following joint replacement surgery: Secondary | ICD-10-CM | POA: Diagnosis not present

## 2022-05-25 DIAGNOSIS — R1314 Dysphagia, pharyngoesophageal phase: Secondary | ICD-10-CM | POA: Diagnosis not present

## 2022-05-25 DIAGNOSIS — Z8041 Family history of malignant neoplasm of ovary: Secondary | ICD-10-CM

## 2022-05-25 DIAGNOSIS — M6281 Muscle weakness (generalized): Secondary | ICD-10-CM | POA: Diagnosis not present

## 2022-05-25 DIAGNOSIS — Z800 Family history of malignant neoplasm of digestive organs: Secondary | ICD-10-CM

## 2022-05-25 DIAGNOSIS — R531 Weakness: Secondary | ICD-10-CM | POA: Diagnosis not present

## 2022-05-25 DIAGNOSIS — M1711 Unilateral primary osteoarthritis, right knee: Secondary | ICD-10-CM | POA: Diagnosis not present

## 2022-05-25 DIAGNOSIS — Z96641 Presence of right artificial hip joint: Secondary | ICD-10-CM | POA: Diagnosis not present

## 2022-05-25 DIAGNOSIS — M25551 Pain in right hip: Secondary | ICD-10-CM | POA: Diagnosis not present

## 2022-05-25 DIAGNOSIS — R41841 Cognitive communication deficit: Secondary | ICD-10-CM | POA: Diagnosis not present

## 2022-05-25 DIAGNOSIS — H3530 Unspecified macular degeneration: Secondary | ICD-10-CM | POA: Diagnosis not present

## 2022-05-25 DIAGNOSIS — Z7401 Bed confinement status: Secondary | ICD-10-CM | POA: Diagnosis not present

## 2022-05-25 LAB — PROTIME-INR
INR: 1 (ref 0.8–1.2)
Prothrombin Time: 12.9 seconds (ref 11.4–15.2)

## 2022-05-25 LAB — BASIC METABOLIC PANEL
Anion gap: 8 (ref 5–15)
BUN: 19 mg/dL (ref 8–23)
CO2: 25 mmol/L (ref 22–32)
Calcium: 9.6 mg/dL (ref 8.9–10.3)
Chloride: 103 mmol/L (ref 98–111)
Creatinine, Ser: 0.82 mg/dL (ref 0.44–1.00)
GFR, Estimated: >60 mL/min (ref >60)
Glucose, Bld: 114 mg/dL — ABNORMAL HIGH (ref 70–99)
Potassium: 4.1 mmol/L (ref 3.5–5.1)
Sodium: 136 mmol/L (ref 135–145)

## 2022-05-25 LAB — URINALYSIS, ROUTINE W REFLEX MICROSCOPIC
Bilirubin Urine: NEGATIVE
Glucose, UA: NEGATIVE mg/dL
Hgb urine dipstick: NEGATIVE
Ketones, ur: NEGATIVE mg/dL
Leukocytes,Ua: NEGATIVE
Nitrite: NEGATIVE
Protein, ur: NEGATIVE mg/dL
Specific Gravity, Urine: 1.013 (ref 1.005–1.030)
pH: 6 (ref 5.0–8.0)

## 2022-05-25 LAB — CBC WITH DIFFERENTIAL/PLATELET
Abs Immature Granulocytes: 0.03 10*3/uL (ref 0.00–0.07)
Basophils Absolute: 0.1 10*3/uL (ref 0.0–0.1)
Basophils Relative: 1 %
Eosinophils Absolute: 0.4 10*3/uL (ref 0.0–0.5)
Eosinophils Relative: 4 %
HCT: 42.4 % (ref 36.0–46.0)
Hemoglobin: 14.3 g/dL (ref 12.0–15.0)
Immature Granulocytes: 0 %
Lymphocytes Relative: 12 %
Lymphs Abs: 1.2 10*3/uL (ref 0.7–4.0)
MCH: 32.4 pg (ref 26.0–34.0)
MCHC: 33.7 g/dL (ref 30.0–36.0)
MCV: 96.1 fL (ref 80.0–100.0)
Monocytes Absolute: 0.5 10*3/uL (ref 0.1–1.0)
Monocytes Relative: 5 %
Neutro Abs: 7.7 10*3/uL (ref 1.7–7.7)
Neutrophils Relative %: 78 %
Platelets: 196 10*3/uL (ref 150–400)
RBC: 4.41 MIL/uL (ref 3.87–5.11)
RDW: 11.9 % (ref 11.5–15.5)
WBC: 9.9 10*3/uL (ref 4.0–10.5)
nRBC: 0 % (ref 0.0–0.2)

## 2022-05-25 LAB — VITAMIN D 25 HYDROXY (VIT D DEFICIENCY, FRACTURES): Vit D, 25-Hydroxy: 124.97 ng/mL — ABNORMAL HIGH (ref 30–100)

## 2022-05-25 LAB — TYPE AND SCREEN
ABO/RH(D): O POS
Antibody Screen: NEGATIVE

## 2022-05-25 MED ORDER — ONDANSETRON HCL 4 MG/2ML IJ SOLN
4.0000 mg | Freq: Once | INTRAMUSCULAR | Status: AC
  Administered 2022-05-25: 4 mg via INTRAVENOUS
  Filled 2022-05-25: qty 2

## 2022-05-25 MED ORDER — PANTOPRAZOLE SODIUM 40 MG PO TBEC
40.0000 mg | DELAYED_RELEASE_TABLET | Freq: Two times a day (BID) | ORAL | Status: DC
  Administered 2022-05-25 – 2022-05-29 (×7): 40 mg via ORAL
  Filled 2022-05-25 (×7): qty 1

## 2022-05-25 MED ORDER — ONDANSETRON HCL 4 MG/2ML IJ SOLN: 4.0000 mg | Freq: Four times a day (QID) | INTRAMUSCULAR | Status: DC | PRN

## 2022-05-25 MED ORDER — DORZOLAMIDE HCL-TIMOLOL MAL 2-0.5 % OP SOLN: 1.0000 [drp] | Freq: Two times a day (BID) | OPHTHALMIC | Status: DC

## 2022-05-25 MED ORDER — BISACODYL 5 MG PO TBEC: 5.0000 mg | DELAYED_RELEASE_TABLET | Freq: Every day | ORAL | Status: DC | PRN

## 2022-05-25 MED ORDER — TRANEXAMIC ACID-NACL 1000-0.7 MG/100ML-% IV SOLN
1000.0000 mg | Freq: Once | INTRAVENOUS | Status: AC
  Administered 2022-05-25: 1000 mg via INTRAVENOUS
  Filled 2022-05-25: qty 100

## 2022-05-25 MED ORDER — TIMOLOL MALEATE 0.5 % OP SOLN
1.0000 [drp] | Freq: Two times a day (BID) | OPHTHALMIC | Status: DC
  Administered 2022-05-25 – 2022-05-29 (×7): 1 [drp] via OPHTHALMIC
  Filled 2022-05-25: qty 5

## 2022-05-25 MED ORDER — SODIUM CHLORIDE (HYPERTONIC) 2 % OP SOLN
1.0000 [drp] | Freq: Three times a day (TID) | OPHTHALMIC | Status: DC
  Administered 2022-05-25 – 2022-05-29 (×12): 1 [drp] via OPHTHALMIC
  Filled 2022-05-25 (×2): qty 15

## 2022-05-25 MED ORDER — MORPHINE SULFATE (PF) 4 MG/ML IV SOLN
4.0000 mg | Freq: Once | INTRAVENOUS | Status: AC
  Administered 2022-05-25: 4 mg via INTRAVENOUS
  Filled 2022-05-25: qty 1

## 2022-05-25 MED ORDER — MORPHINE SULFATE (PF) 2 MG/ML IV SOLN
0.5000 mg | INTRAVENOUS | Status: DC | PRN
  Administered 2022-05-26: 0.5 mg via INTRAVENOUS
  Filled 2022-05-25: qty 1

## 2022-05-25 MED ORDER — LORATADINE 10 MG PO TABS
10.0000 mg | ORAL_TABLET | Freq: Every morning | ORAL | Status: DC
  Administered 2022-05-27 – 2022-05-29 (×3): 10 mg via ORAL
  Filled 2022-05-25 (×3): qty 1

## 2022-05-25 MED ORDER — LATANOPROST 0.005 % OP SOLN
1.0000 [drp] | Freq: Every day | OPHTHALMIC | Status: DC
  Administered 2022-05-25 – 2022-05-28 (×4): 1 [drp] via OPHTHALMIC
  Filled 2022-05-25: qty 2.5

## 2022-05-25 MED ORDER — HEPARIN SODIUM (PORCINE) 5000 UNIT/ML IJ SOLN: 5000.0000 [IU] | Freq: Three times a day (TID) | INTRAMUSCULAR | Status: DC

## 2022-05-25 MED ORDER — SODIUM CHLORIDE 0.9 % IV SOLN: INTRAVENOUS | Status: DC

## 2022-05-25 MED ORDER — DORZOLAMIDE HCL 2 % OP SOLN
1.0000 [drp] | Freq: Two times a day (BID) | OPHTHALMIC | Status: DC
  Administered 2022-05-25 – 2022-05-29 (×7): 1 [drp] via OPHTHALMIC
  Filled 2022-05-25 (×2): qty 10

## 2022-05-25 MED ORDER — VITAMIN D (ERGOCALCIFEROL) 1.25 MG (50000 UNIT) PO CAPS
50000.0000 [IU] | ORAL_CAPSULE | ORAL | Status: DC
  Administered 2022-05-29: 50000 [IU] via ORAL
  Filled 2022-05-25: qty 1

## 2022-05-25 MED ORDER — ZOLPIDEM TARTRATE 5 MG PO TABS
5.0000 mg | ORAL_TABLET | Freq: Every evening | ORAL | Status: DC | PRN
  Administered 2022-05-26: 5 mg via ORAL
  Filled 2022-05-25: qty 1

## 2022-05-25 MED ORDER — HYDROCODONE-ACETAMINOPHEN 5-325 MG PO TABS
1.0000 | ORAL_TABLET | Freq: Four times a day (QID) | ORAL | Status: DC | PRN
  Administered 2022-05-25: 2 via ORAL
  Filled 2022-05-25: qty 2

## 2022-05-25 MED ORDER — ATORVASTATIN CALCIUM 20 MG PO TABS
20.0000 mg | ORAL_TABLET | Freq: Every day | ORAL | Status: DC
  Administered 2022-05-25 – 2022-05-28 (×4): 20 mg via ORAL
  Filled 2022-05-25 (×4): qty 1

## 2022-05-25 MED ORDER — LEVOTHYROXINE SODIUM 88 MCG PO TABS
88.0000 ug | ORAL_TABLET | Freq: Every day | ORAL | Status: DC
  Administered 2022-05-27 – 2022-05-29 (×3): 88 ug via ORAL
  Filled 2022-05-25 (×3): qty 1

## 2022-05-25 MED ORDER — MONTELUKAST SODIUM 10 MG PO TABS
10.0000 mg | ORAL_TABLET | Freq: Every day | ORAL | Status: DC
  Administered 2022-05-25 – 2022-05-28 (×4): 10 mg via ORAL
  Filled 2022-05-25 (×4): qty 1

## 2022-05-25 MED ORDER — FLUTICASONE PROPIONATE 50 MCG/ACT NA SUSP
2.0000 | Freq: Every day | NASAL | Status: DC
  Administered 2022-05-28: 2 via NASAL
  Filled 2022-05-25: qty 16

## 2022-05-25 MED ORDER — SODIUM CHLORIDE (HYPERTONIC) 5 % OP OINT: 1.0000 | TOPICAL_OINTMENT | Freq: Every day | OPHTHALMIC | Status: DC

## 2022-05-25 MED ORDER — IPRATROPIUM BROMIDE 0.03 % NA SOLN
2.0000 | Freq: Every day | NASAL | Status: DC
  Filled 2022-05-25: qty 30

## 2022-05-25 MED ORDER — METOPROLOL SUCCINATE ER 50 MG PO TB24
50.0000 mg | ORAL_TABLET | Freq: Every day | ORAL | Status: DC
  Administered 2022-05-27 – 2022-05-29 (×3): 50 mg via ORAL
  Filled 2022-05-25 (×3): qty 1

## 2022-05-25 NOTE — Hospital Course (Addendum)
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  There was no loss of consciousness.  She fell on her right hip and she said it felt like it fractured at the time.  She was unable to get up off the floor and she crawled to she could get to a telephone and called her sister who came to assist her get to the hospital.  She takes aspirin 81 mg daily.  She is on supplemental vitamin D and has osteoporosis.  She was seen in ED and xrays noted acute comminuted fracture of the right femoral neck with impaction and shortening.  CT of the head and C-spine with no acute abnormalities but diffuse degenerative changes noted.  Patient requested to be treated by [facility] which she holds in high regards.  Dr. Linna Caprice with [facility] was consulted who agreed to see the patient in consultation and recommended patient be admitted to [facility] where they performed their operative procedures.

## 2022-05-25 NOTE — Plan of Care (Signed)

## 2022-05-25 NOTE — H&P (Addendum)
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  There was no loss of consciousness.  She fell on her right hip and she said it felt like it fractured at the time.  She was unable to get up off the floor and she crawled to she could get to a telephone and called her sister who came to assist her get to the hospital.  She takes aspirin 81 mg daily.  She is on supplemental vitamin D and has osteoporosis.  She was seen in ED and xrays noted acute comminuted fracture of the right femoral neck with impaction and shortening.  CT of the head and C-spine with no acute abnormalities but diffuse degenerative changes noted.  Patient requested to be treated by [facility] which she holds in high regards.  Dr. Linna Caprice with [facility] was consulted who agreed to see the patient in consultation and recommended patient be admitted to [facility] where they performed their operative procedures.  Past Medical History:  Diagnosis Date   Allergy    environmental per pt   Arthritis    Asthma    Depression    Elevated LFTs    Esophageal dysmotility    GERD (gastroesophageal reflux disease)    Headache(784.0)    Hiatal hernia    Hyperplastic  polyps of stomach    Hypertension    Hypothyroid    Irritable bowel syndrome    Macular degeneration    Nausea & vomiting 04/02/2012   Osteoporosis    Shingles    Sinusitis    Tachycardia    Vitamin D deficiency     Past Surgical History:  Procedure Laterality Date   AORTOGRAM  04/16/12   COLONOSCOPY  2008   normal   ESOPHAGOGASTRODUODENOSCOPY  2014   multiple    ESOPHAGOGASTRODUODENOSCOPY Left 01/30/2019   Procedure: ESOPHAGOGASTRODUODENOSCOPY (EGD);  Surgeon: Shellia Cleverly, DO;  Location: [facility] ENDOSCOPY;  Service: Gastroenterology;  Laterality: Left;   FOREIGN BODY REMOVAL  01/30/2019   Procedure: FOREIGN BODY REMOVAL;  Surgeon: Shellia Cleverly, DO;  Location: [facility] ENDOSCOPY;  Service: Gastroenterology;;   MULTIPLE TOOTH EXTRACTIONS     TOTAL ABDOMINAL HYSTERECTOMY  1999   UPPER GASTROINTESTINAL ENDOSCOPY       reports that she has never smoked. She has never used smokeless tobacco. She reports that she does not drink alcohol and does not use drugs.  No Known Allergies  Family History  Problem Relation Age of Onset   Hypertension Mother    Heart attack Mother    Heart attack Father 72   Hypertension Father    Peripheral vascular disease Father    Heart failure Sister    Cancer Sister    Hypertension Brother    Cancer Brother    Hypertension Brother    Breast cancer Sister  Ovarian cancer Other        mat cousin   Prostate cancer Maternal Uncle    Colon polyps Sister        siblings   Colon cancer Other        Maternal Aunt x3 Maternal Uncle x 2   Esophageal cancer Brother        died at 59   Kidney disease Brother    Stomach cancer Neg Hx    Rectal cancer Neg Hx     Prior to Admission medications   Medication Sig Start Date End Date Taking? Authorizing Provider  acetaminophen (TYLENOL) 500 MG tablet Take 1,000 mg by mouth in the morning and at bedtime.   Yes [provider]  aspirin 81 MG tablet Take 81 mg by mouth daily.   Yes [provider]  atorvastatin (LIPITOR) 20 MG tablet Take 20 mg by mouth at bedtime.   Yes [provider]  dorzolamide-timolol (COSOPT) 2-0.5 % ophthalmic solution Place 1 drop into the right eye 2 (two) times daily.   Yes [provider]  fexofenadine (ALLEGRA) 180 MG tablet Take 180 mg by mouth every morning.   Yes [provider]  fluticasone (FLONASE) 50 MCG/ACT nasal spray Place 2 sprays into the nose daily. 12/18/10 05/25/22 Yes [place], Peter M, MD  ipratropium (ATROVENT) 0.03 % nasal spray Place 2 sprays into both nostrils daily. 03/01/22  Yes [provider]  latanoprost (XALATAN) 0.005 % ophthalmic solution Place 1 drop into both eyes at bedtime.   Yes [provider]  levocetirizine (XYZAL) 5 MG tablet Take 5 mg by mouth every evening.   Yes [provider]  levothyroxine (SYNTHROID, LEVOTHROID) 88 MCG tablet Take 88 mcg by mouth daily before breakfast. *BRAND NAME ONLY   Yes [provider]  metoprolol succinate (TOPROL-XL) 50 MG 24 hr tablet TAKE ONE TABLET BY MOUTH ONE TIME DAILY Patient taking differently: Take 50 mg by mouth daily. 09/02/12  Yes [place], Peter M, MD  montelukast (SINGULAIR) 10 MG tablet Take 10 mg by mouth at bedtime.   Yes [provider]  Multiple Vitamins-Minerals (PRESERVISION AREDS 2 PO) Take 1 tablet by mouth 2 (two) times daily.    Yes [provider]  pantoprazole (PROTONIX) 40 MG tablet TAKE 1 TABLET BY MOUTH 2 TIMES DAILY Patient taking differently: Take 40 mg by mouth 2 (two) times daily. 05/10/20  Yes Cirigliano, Vito V, DO  sodium chloride (MURO 128) 2 % ophthalmic solution Place 1 drop into both eyes in the morning, at noon, in the evening, and at bedtime.   Yes [provider]  sodium chloride (MURO 128) 5 % ophthalmic ointment Place 1 Application into both eyes at bedtime.   Yes [provider]  triamcinolone (NASACORT ALLERGY 24HR) 55 MCG/ACT AERO nasal  inhaler Place 2 sprays into the nose daily.   Yes [provider]  triamcinolone cream (KENALOG) 0.5 % Apply 1 Application topically daily as needed (for irritation on hands). 01/18/19  Yes [provider]  Vitamin D, Ergocalciferol, (DRISDOL) 50000 UNITS CAPS Take 50,000 Units by mouth every Wednesday.   Yes [provider]  sucralfate (CARAFATE) 1 GM/10ML suspension Take 10 mLs (1 g total) by mouth 4 (four) times daily. Patient not taking: Reported on 03/01/2019 01/30/19 01/30/20  Shellia Cleverly, DO    Physical Exam: Vitals:   05/25/22 1115 05/25/22 1121 05/25/22 1500  BP:  (!) 154/94 (!) 150/88  Pulse:  85 82  Resp:  20 14  Temp:  (!) 97.5 F (36.4 C) 98 F (36.7 C)  TempSrc:  Oral Oral  SpO2:  98% 97%  Weight: 62.1 kg    Height:  (1.651 m)     Constitutional: frail, elderly female, lying supine on gurnee, cooperative, conversational and pleasant, NAD, calm, comfortable Eyes: PERRL, lids and conjunctivae normal ENMT: Mucous membranes are dry. Posterior pharynx clear of any exudate or lesions.  Neck: normal, supple, no masses, no thyromegaly Respiratory: clear to auscultation bilaterally, no wheezing, no crackles. Normal respiratory effort. No accessory muscle use.  Cardiovascular: normal s1, s2 sounds, no murmurs / rubs / gallops. No extremity edema. 2+ pedal pulses. No carotid bruits.  Abdomen: no tenderness, no masses palpated. No hepatosplenomegaly. Bowel sounds positive.  Musculoskeletal: no clubbing / cyanosis. Right leg externally rotated out. Good ROM, no contractures. Warm and dry. Normal muscle tone.  Pedal pulses palpable.  Skin: no rashes, lesions, ulcers. No induration Neurologic: CN 2-12 grossly intact. Sensation intact.  Psychiatric: Normal judgment and insight. Alert and oriented x 3. Normal mood.   Labs on Admission: I have personally reviewed following labs and imaging studies  CBC: Recent Labs  Lab 05/25/22 1126  WBC 9.9   NEUTROABS 7.7  HGB 14.3  HCT 42.4  MCV 96.1  PLT 196   Basic Metabolic Panel: Recent Labs  Lab 05/25/22 1126  NA 136  K 4.1  CL 103  CO2 25  GLUCOSE 114*  BUN 19  CREATININE 0.82  CALCIUM 9.6   GFR: Estimated Creatinine Clearance: 39.4 mL/min (by C-G formula based on SCr of 0.82 mg/dL). Liver Function Tests: No results for input(s): "AST", "ALT", "ALKPHOS", "BILITOT", "PROT", "ALBUMIN" in the last 168 hours. No results for input(s): "LIPASE", "AMYLASE" in the last 168 hours. No results for input(s): "AMMONIA" in the last 168 hours. Coagulation Profile: Recent Labs  Lab 05/25/22 1126  INR 1.0   Cardiac Enzymes: No results for input(s): "CKTOTAL", "CKMB", "CKMBINDEX", "TROPONINI" in the last 168 hours. BNP (last 3 results) No results for input(s): "PROBNP" in the last 8760 hours. HbA1C: No results for input(s): "HGBA1C" in the last 72 hours. CBG: No results for input(s): "GLUCAP" in the last 168 hours. Lipid Profile: No results for input(s): "CHOL", "HDL", "LDLCALC", "TRIG", "CHOLHDL", "LDLDIRECT" in the last 72 hours. Thyroid Function Tests: No results for input(s): "TSH", "T4TOTAL", "FREET4", "T3FREE", "THYROIDAB" in the last 72 hours. Anemia Panel: No results for input(s): "VITAMINB12", "FOLATE", "FERRITIN", "TIBC", "IRON", "RETICCTPCT" in the last 72 hours. Urine analysis:    Component Value Date/Time   COLORURINE YELLOW 05/25/2022 1230   APPEARANCEUR CLEAR 05/25/2022 1230   LABSPEC 1.013 05/25/2022 1230   PHURINE 6.0 05/25/2022 1230   GLUCOSEU NEGATIVE 05/25/2022 1230   HGBUR NEGATIVE 05/25/2022 1230   BILIRUBINUR NEGATIVE 05/25/2022 1230   KETONESUR NEGATIVE 05/25/2022 1230   PROTEINUR NEGATIVE 05/25/2022 1230   NITRITE NEGATIVE 05/25/2022 1230   LEUKOCYTESUR NEGATIVE 05/25/2022 1230    Radiological Exams on Admission: CT Head Wo Contrast  Result Date: 05/25/2022 CLINICAL DATA:  Fall this morning.  Blunt head and neck trauma. EXAM: CT HEAD WITHOUT  CONTRAST CT CERVICAL SPINE WITHOUT CONTRAST TECHNIQUE: Multidetector CT imaging of the head and cervical spine was performed following the standard protocol without intravenous contrast. Multiplanar CT image reconstructions of the cervical spine were also generated. RADIATION DOSE REDUCTION: This exam was performed according to the departmental dose-optimization program which includes automated exposure control, adjustment of the mA and/or kV according  to patient size and/or use of iterative reconstruction technique. COMPARISON:  None Available. FINDINGS: CT HEAD FINDINGS Brain: No evidence of intracranial hemorrhage, acute infarction, hydrocephalus, extra-axial collection, or mass lesion/mass effect. Mild diffuse cerebral atrophy and chronic small vessel disease noted. Vascular:  No hyperdense vessel or other acute findings. Skull: No evidence of fracture or other significant bone abnormality. Sinuses/Orbits: Near-complete opacification of maxillary, ethmoid, sphenoid, and frontal sinuses is seen. Other: None. CT CERVICAL SPINE FINDINGS Alignment: 4 mm anterolisthesis is seen at C3-4, likely due to bilateral facet DJD seen at this level. Skull base and vertebrae: No acute fracture. No primary bone lesion or focal pathologic process. Soft tissues and spinal canal: No prevertebral fluid or swelling. No visible canal hematoma. Disc levels: Severe degenerative disc disease is seen at C4-5, C5-6, and C6-7. Moderate facet DJD is seen bilaterally at C2-3 and C3-4. Advanced atlantoaxial degenerative changes are also seen. Upper chest: No acute findings. Other: None. IMPRESSION: No acute intracranial abnormality. Mild cerebral atrophy and chronic small vessel disease. No evidence of acute cervical spine fracture. Degenerative spondylosis. 4 mm anterolisthesis at C3-4, likely due to bilateral facet DJD at this level. Recommend clinical correlation, and consider flexion-extension views of the cervical spine to assess for  ligamentous instability. Electronically Signed   By: Danae Orleans M.D.   On: 05/25/2022 14:54   CT Cervical Spine Wo Contrast  Result Date: 05/25/2022 CLINICAL DATA:  Fall this morning.  Blunt head and neck trauma. EXAM: CT HEAD WITHOUT CONTRAST CT CERVICAL SPINE WITHOUT CONTRAST TECHNIQUE: Multidetector CT imaging of the head and cervical spine was performed following the standard protocol without intravenous contrast. Multiplanar CT image reconstructions of the cervical spine were also generated. RADIATION DOSE REDUCTION: This exam was performed according to the departmental dose-optimization program which includes automated exposure control, adjustment of the mA and/or kV according to patient size and/or use of iterative reconstruction technique. COMPARISON:  None Available. FINDINGS: CT HEAD FINDINGS Brain: No evidence of intracranial hemorrhage, acute infarction, hydrocephalus, extra-axial collection, or mass lesion/mass effect. Mild diffuse cerebral atrophy and chronic small vessel disease noted. Vascular:  No hyperdense vessel or other acute findings. Skull: No evidence of fracture or other significant bone abnormality. Sinuses/Orbits: Near-complete opacification of maxillary, ethmoid, sphenoid, and frontal sinuses is seen. Other: None. CT CERVICAL SPINE FINDINGS Alignment: 4 mm anterolisthesis is seen at C3-4, likely due to bilateral facet DJD seen at this level. Skull base and vertebrae: No acute fracture. No primary bone lesion or focal pathologic process. Soft tissues and spinal canal: No prevertebral fluid or swelling. No visible canal hematoma. Disc levels: Severe degenerative disc disease is seen at C4-5, C5-6, and C6-7. Moderate facet DJD is seen bilaterally at C2-3 and C3-4. Advanced atlantoaxial degenerative changes are also seen. Upper chest: No acute findings. Other: None. IMPRESSION: No acute intracranial abnormality. Mild cerebral atrophy and chronic small vessel disease. No evidence of  acute cervical spine fracture. Degenerative spondylosis. 4 mm anterolisthesis at C3-4, likely due to bilateral facet DJD at this level. Recommend clinical correlation, and consider flexion-extension views of the cervical spine to assess for ligamentous instability. Electronically Signed   By: Danae Orleans M.D.   On: 05/25/2022 14:54   DG Chest Port 1 View  Result Date: 05/25/2022 CLINICAL DATA:  Fall EXAM: PORTABLE CHEST 1 VIEW COMPARISON:  07/24/2004 FINDINGS: The heart size and mediastinal contours are within normal limits. Aortic atherosclerosis. Calcified granuloma in the right mid lung. Both lungs are clear. No pleural effusion or pneumothorax.  The visualized skeletal structures are unremarkable. IMPRESSION: No active disease. Electronically Signed   By: Duanne Guess D.O.   On: 05/25/2022 13:33   DG Sacrum/Coccyx  Result Date: 05/25/2022 CLINICAL DATA:  Fall EXAM: SACRUM AND COCCYX - 2+ VIEW COMPARISON:  None Available. FINDINGS: There is no evidence of fracture or other focal bone lesions involving the sacrum or coccyx. Evaluation is limited in the absence of a lateral view. Acute right femoral neck fracture with impaction and superior migration. IMPRESSION: 1. Limited exam without evidence of sacrococcygeal fracture. 2. Acute right femoral neck fracture. Electronically Signed   By: Duanne Guess D.O.   On: 05/25/2022 13:31   DG Knee Complete 4 Views Right  Result Date: 05/25/2022 CLINICAL DATA:  Fall EXAM: RIGHT KNEE - COMPLETE 4+ VIEW COMPARISON:  None Available. FINDINGS: No evidence of fracture or dislocation. Small-moderate knee joint effusion. Moderate degenerative changes of the right knee with joint space narrowing and chondrocalcinosis. Soft tissues are unremarkable. IMPRESSION: 1. No acute fracture or dislocation. 2. Small-moderate knee joint effusion, nonspecific. 3. Moderate degenerative changes of the right knee. Electronically Signed   By: Duanne Guess D.O.   On: 05/25/2022  13:29   DG Hip Unilat W or Wo Pelvis 2-3 Views Right  Result Date: 05/25/2022 CLINICAL DATA:  Fall EXAM: DG HIP (WITH OR WITHOUT PELVIS) 2-3V RIGHT COMPARISON:  None Available. FINDINGS: Acute comminuted fracture of the right femoral neck with fracture impaction and shortening. No appreciable involvement of the intertrochanteric region. No hip joint dislocation. Bony pelvis appears intact without evidence of fracture or diastasis. IMPRESSION: Acute comminuted fracture of the right femoral neck with fracture impaction and shortening. Electronically Signed   By: Duanne Guess D.O.   On: 05/25/2022 13:29    EKG: Independently reviewed.    Assessment/Plan Principal Problem:   Closed displaced fracture of right femoral neck Active Problems:   Hyperlipidemia   ESOPHAGEAL MOTILITY DISORDER   GERD   Palpitations   HTN (hypertension)   Superior mesenteric artery stenosis   Chronic mesenteric ischemia   Fall at home    Acute closed right femoral neck fracture  - bed rest now  - [facility]oped[facility] consultation at [facility] Dr. Linna Caprice consulted who will see patient at [facility]  - NPO after midnight in case going to OR on 4/14 - IV pain and nausea medication ordered - IV fluid hydration ordered  - IV tranexamic acid ordered  - check 25-OH vitamin D - patient appears medically stable at this time for proceeding with orthopedic surgery   Chronic allergic Rhinitis  - resume home antihistamines - resume home nasal steroid decongestant - resume nightly montelukast   Essential hypertension  - resume home metoprolol XL 50 mg daily   Hypothyroidism  - resume home daily levothyroxine before breakfast  Glaucoma  - resume home latanoprost, timolol, dorzolamide eye drops  GERD  - resume pantoprazole 40 mg BID   Vitamin D deficiency  - resume weekly drisol 50k IU caps - follow up on vitamin D level  Hyperlipidemia  - resume home atorvastatin 20 mg daily    DVT prophylaxis:   heparin   Code Status: full   Family Communication: sister bedside update 4/13  Disposition Plan: TBD   Consults called: orthopedics Dr. Linna Caprice   Admission status: INP  Level of care: Med-Surg Standley Dakins MD [facility] How to contact the [facility] Attending or Consulting provider 7A - 7P or covering provider during after hours 7P -7A, for  this patient?  Check the care team in [facility] and look for a) attending/consulting [facility] provider listed and b) the [facility] team listed Log into [email or web address] and use [place]'s universal password to access. If you do not have the password, please contact the hospital operator. Locate the [facility] provider you are looking for under [facility] and page to a number that you can be directly reached. If you still have difficulty reaching the provider, please page the [facility] (Director on Call) for the Hospitalists listed on amion for assistance.   If 7PM-7AM, please contact night-coverage [email or web address] Password [facility]  05/25/2022, 3:35 PM

## 2022-05-25 NOTE — ED Triage Notes (Signed)
Pt fell this morning, reported that pt hit her head on a door frame, denies LOC. C/o right hip pain.  Takes ASA daily

## 2022-05-25 NOTE — ED Notes (Signed)
Patient transferred to CT

## 2022-05-25 NOTE — Progress Notes (Signed)
Patient seen on arrival to [place], appears comfortable and has no complaints. I took report from Dr Laural Benes at [facility]. Discussed with Dr Veda Canning, will keep NPO after midnight with possible operative repair tomorrow depending on the OR schedule.   Yardley Lekas M. Elvera Lennox, MD, PhD [facility]  Between 7 am - 7 pm you can contact me via Amion (for emergencies) or Securechat (non urgent matters).  I am not available 7 pm - 7 am, please contact night coverage MD/APP via Amion

## 2022-05-25 NOTE — ED Provider Notes (Signed)
[name] AT [facility] Provider Note   CSN: [id number] Arrival date & time: 05/25/22  1058     History  Chief Complaint  Patient presents with   Megan Foster is a 89 y.o. female.  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  Pt fell today while walking.  She hit her head on a door frame and landed on her right hip.  She denies loc.  She is on asa/plavix, but no blood thinners.           Home Medications Prior to Admission medications   Medication Sig Start Date End Date Taking? Authorizing Provider  acetaminophen (TYLENOL) 500 MG tablet Take 1,000 mg by mouth in the morning and at bedtime.   Yes [provider]  aspirin 81 MG tablet Take 81 mg by mouth daily.   Yes [provider]  atorvastatin (LIPITOR) 20 MG tablet Take 20 mg by mouth at bedtime.   Yes [provider]  dorzolamide-timolol (COSOPT) 2-0.5 % ophthalmic solution Place 1 drop into the right eye 2 (two) times daily.   Yes [provider]  fexofenadine (ALLEGRA) 180 MG tablet Take 180 mg by mouth every morning.   Yes [provider]  fluticasone (FLONASE) 50 MCG/ACT nasal spray Place 2 sprays into the nose daily. 12/18/10 05/25/22 Yes [place], Peter M, MD  ipratropium (ATROVENT) 0.03 % nasal spray Place 2 sprays into both nostrils daily. 03/01/22  Yes [provider]  latanoprost (XALATAN) 0.005 % ophthalmic solution Place 1 drop into both eyes at bedtime.   Yes [provider]  levocetirizine (XYZAL) 5 MG tablet Take 5 mg by mouth every evening.   Yes [provider]  levothyroxine (SYNTHROID, LEVOTHROID) 88 MCG tablet Take 88 mcg by mouth daily before breakfast. *BRAND NAME ONLY   Yes [provider]  metoprolol succinate (TOPROL-XL) 50 MG 24 hr tablet  TAKE ONE TABLET BY MOUTH ONE TIME DAILY Patient taking differently: Take 50 mg by mouth daily. 09/02/12  Yes [place], Peter M, MD  montelukast (SINGULAIR) 10 MG tablet Take 10 mg by mouth at bedtime.   Yes [provider]  Multiple Vitamins-Minerals (PRESERVISION AREDS 2 PO) Take 1 tablet by mouth 2 (two) times daily.    Yes [provider]  pantoprazole (PROTONIX) 40 MG tablet TAKE 1 TABLET BY MOUTH 2 TIMES DAILY Patient taking differently: Take 40 mg by mouth 2 (two) times daily. 05/10/20  Yes Cirigliano, Vito V, DO  sodium chloride (MURO 128) 2 % ophthalmic solution Place 1 drop into both eyes in the morning, at noon, in the evening, and at bedtime.   Yes [provider]  sodium chloride (MURO 128) 5 % ophthalmic ointment Place 1 Application into both eyes at bedtime.   Yes [provider]  triamcinolone (NASACORT ALLERGY 24HR) 55 MCG/ACT AERO nasal inhaler Place 2 sprays into the nose daily.   Yes [provider]  triamcinolone cream (KENALOG) 0.5 % Apply 1 Application topically daily as needed (for irritation on hands). 01/18/19  Yes [provider]  Vitamin D, Ergocalciferol, (DRISDOL) 50000 UNITS CAPS Take 50,000 Units by mouth every Wednesday.   Yes [provider]  sucralfate (CARAFATE) 1 GM/10ML suspension Take 10 mLs (1 g total) by mouth 4 (four) times daily. Patient not taking: Reported on 03/01/2019 01/30/19  01/30/20  Cirigliano, Vito V, DO      Allergies    Patient has no known allergies.    Review of Systems   Review of Systems  Musculoskeletal:        Right hip  All other systems reviewed and are negative.   Physical Exam Updated Vital Signs BP (!) 154/94   Pulse 85   Temp (!) 97.5 F (36.4 C) (Oral)   Resp 20   Ht 5\' 5"  (1.651 m)   Wt 62.1 kg   SpO2 98%   BMI 22.80 kg/m  Physical Exam Vitals and nursing note reviewed.  Constitutional:      Appearance: Normal appearance.  HENT:     Head: Normocephalic  and atraumatic.     Right Ear: External ear normal.     Left Ear: External ear normal.     Nose: Nose normal.     Mouth/Throat:     Mouth: Mucous membranes are moist.     Pharynx: Oropharynx is clear.  Eyes:     Extraocular Movements: Extraocular movements intact.     Conjunctiva/sclera: Conjunctivae normal.     Pupils: Pupils are equal, round, and reactive to light.  Cardiovascular:     Rate and Rhythm: Normal rate and regular rhythm.     Pulses: Normal pulses.     Heart sounds: Normal heart sounds.  Pulmonary:     Effort: Pulmonary effort is normal.     Breath sounds: Normal breath sounds.  Abdominal:     General: Abdomen is flat. Bowel sounds are normal.     Palpations: Abdomen is soft.  Musculoskeletal:     Cervical back: Normal range of motion and neck supple.       Legs:  Skin:    General: Skin is warm.     Capillary Refill: Capillary refill takes less than 2 seconds.  Neurological:     General: No focal deficit present.     Mental Status: She is alert and oriented to person, place, and time.  Psychiatric:        Mood and Affect: Mood normal.        Behavior: Behavior normal.     ED Results / Procedures / Treatments   Labs (all labs ordered are listed, but only abnormal results are displayed) Labs Reviewed  BASIC METABOLIC PANEL - Abnormal; Notable for the following components:      Result Value   Glucose, Bld 114 (*)    All other components within normal limits  CBC WITH DIFFERENTIAL/PLATELET  PROTIME-INR  URINALYSIS, ROUTINE W REFLEX MICROSCOPIC  CBC  CREATININE, SERUM  VITAMIN D 25 HYDROXY (VIT D DEFICIENCY, FRACTURES)  TYPE AND SCREEN    EKG EKG Interpretation  Date/Time:  Saturday May 25 2022 11:40:59 EDT Ventricular Rate:  77 PR Interval:  136 QRS Duration: 103 QT Interval:  388 QTC Calculation: 440 R Axis:   0 Text Interpretation: Sinus arrhythmia Ventricular premature complex Low voltage, extremity and precordial leads Anteroseptal  infarct, old Borderline repolarization abnormality No significant change since last tracing Confirmed by Jacalyn Lefevre [phone]) on 05/25/2022 1:42:16 PM  Radiology CT Head Wo Contrast  Result Date: 05/25/2022 CLINICAL DATA:  Fall this morning.  Blunt head and neck trauma. EXAM: CT HEAD WITHOUT CONTRAST CT CERVICAL SPINE WITHOUT CONTRAST TECHNIQUE: Multidetector CT imaging of the head and cervical spine was performed following the standard protocol without intravenous contrast. Multiplanar CT image reconstructions of the cervical spine were also generated. RADIATION DOSE REDUCTION: This  exam was performed according to the departmental dose-optimization program which includes automated exposure control, adjustment of the mA and/or kV according to patient size and/or use of iterative reconstruction technique. COMPARISON:  None Available. FINDINGS: CT HEAD FINDINGS Brain: No evidence of intracranial hemorrhage, acute infarction, hydrocephalus, extra-axial collection, or mass lesion/mass effect. Mild diffuse cerebral atrophy and chronic small vessel disease noted. Vascular:  No hyperdense vessel or other acute findings. Skull: No evidence of fracture or other significant bone abnormality. Sinuses/Orbits: Near-complete opacification of maxillary, ethmoid, sphenoid, and frontal sinuses is seen. Other: None. CT CERVICAL SPINE FINDINGS Alignment: 4 mm anterolisthesis is seen at C3-4, likely due to bilateral facet DJD seen at this level. Skull base and vertebrae: No acute fracture. No primary bone lesion or focal pathologic process. Soft tissues and spinal canal: No prevertebral fluid or swelling. No visible canal hematoma. Disc levels: Severe degenerative disc disease is seen at C4-5, C5-6, and C6-7. Moderate facet DJD is seen bilaterally at C2-3 and C3-4. Advanced atlantoaxial degenerative changes are also seen. Upper chest: No acute findings. Other: None. IMPRESSION: No acute intracranial abnormality. Mild cerebral  atrophy and chronic small vessel disease. No evidence of acute cervical spine fracture. Degenerative spondylosis. 4 mm anterolisthesis at C3-4, likely due to bilateral facet DJD at this level. Recommend clinical correlation, and consider flexion-extension views of the cervical spine to assess for ligamentous instability. Electronically Signed   By: Danae Orleans M.D.   On: 05/25/2022 14:54   CT Cervical Spine Wo Contrast  Result Date: 05/25/2022 CLINICAL DATA:  Fall this morning.  Blunt head and neck trauma. EXAM: CT HEAD WITHOUT CONTRAST CT CERVICAL SPINE WITHOUT CONTRAST TECHNIQUE: Multidetector CT imaging of the head and cervical spine was performed following the standard protocol without intravenous contrast. Multiplanar CT image reconstructions of the cervical spine were also generated. RADIATION DOSE REDUCTION: This exam was performed according to the departmental dose-optimization program which includes automated exposure control, adjustment of the mA and/or kV according to patient size and/or use of iterative reconstruction technique. COMPARISON:  None Available. FINDINGS: CT HEAD FINDINGS Brain: No evidence of intracranial hemorrhage, acute infarction, hydrocephalus, extra-axial collection, or mass lesion/mass effect. Mild diffuse cerebral atrophy and chronic small vessel disease noted. Vascular:  No hyperdense vessel or other acute findings. Skull: No evidence of fracture or other significant bone abnormality. Sinuses/Orbits: Near-complete opacification of maxillary, ethmoid, sphenoid, and frontal sinuses is seen. Other: None. CT CERVICAL SPINE FINDINGS Alignment: 4 mm anterolisthesis is seen at C3-4, likely due to bilateral facet DJD seen at this level. Skull base and vertebrae: No acute fracture. No primary bone lesion or focal pathologic process. Soft tissues and spinal canal: No prevertebral fluid or swelling. No visible canal hematoma. Disc levels: Severe degenerative disc disease is seen at C4-5,  C5-6, and C6-7. Moderate facet DJD is seen bilaterally at C2-3 and C3-4. Advanced atlantoaxial degenerative changes are also seen. Upper chest: No acute findings. Other: None. IMPRESSION: No acute intracranial abnormality. Mild cerebral atrophy and chronic small vessel disease. No evidence of acute cervical spine fracture. Degenerative spondylosis. 4 mm anterolisthesis at C3-4, likely due to bilateral facet DJD at this level. Recommend clinical correlation, and consider flexion-extension views of the cervical spine to assess for ligamentous instability. Electronically Signed   By: Danae Orleans M.D.   On: 05/25/2022 14:54   DG Chest Port 1 View  Result Date: 05/25/2022 CLINICAL DATA:  Fall EXAM: PORTABLE CHEST 1 VIEW COMPARISON:  07/24/2004 FINDINGS: The heart size and mediastinal contours are  within normal limits. Aortic atherosclerosis. Calcified granuloma in the right mid lung. Both lungs are clear. No pleural effusion or pneumothorax. The visualized skeletal structures are unremarkable. IMPRESSION: No active disease. Electronically Signed   By: Duanne Guess D.O.   On: 05/25/2022 13:33   DG Sacrum/Coccyx  Result Date: 05/25/2022 CLINICAL DATA:  Fall EXAM: SACRUM AND COCCYX - 2+ VIEW COMPARISON:  None Available. FINDINGS: There is no evidence of fracture or other focal bone lesions involving the sacrum or coccyx. Evaluation is limited in the absence of a lateral view. Acute right femoral neck fracture with impaction and superior migration. IMPRESSION: 1. Limited exam without evidence of sacrococcygeal fracture. 2. Acute right femoral neck fracture. Electronically Signed   By: Duanne Guess D.O.   On: 05/25/2022 13:31   DG Knee Complete 4 Views Right  Result Date: 05/25/2022 CLINICAL DATA:  Fall EXAM: RIGHT KNEE - COMPLETE 4+ VIEW COMPARISON:  None Available. FINDINGS: No evidence of fracture or dislocation. Small-moderate knee joint effusion. Moderate degenerative changes of the right knee with  joint space narrowing and chondrocalcinosis. Soft tissues are unremarkable. IMPRESSION: 1. No acute fracture or dislocation. 2. Small-moderate knee joint effusion, nonspecific. 3. Moderate degenerative changes of the right knee. Electronically Signed   By: Duanne Guess D.O.   On: 05/25/2022 13:29   DG Hip Unilat W or Wo Pelvis 2-3 Views Right  Result Date: 05/25/2022 CLINICAL DATA:  Fall EXAM: DG HIP (WITH OR WITHOUT PELVIS) 2-3V RIGHT COMPARISON:  None Available. FINDINGS: Acute comminuted fracture of the right femoral neck with fracture impaction and shortening. No appreciable involvement of the intertrochanteric region. No hip joint dislocation. Bony pelvis appears intact without evidence of fracture or diastasis. IMPRESSION: Acute comminuted fracture of the right femoral neck with fracture impaction and shortening. Electronically Signed   By: Duanne Guess D.O.   On: 05/25/2022 13:29    Procedures Procedures    Medications Ordered in ED Medications  0.9 %  sodium chloride infusion ( Intravenous New Bag/Given 05/25/22 1159)  atorvastatin (LIPITOR) tablet 20 mg (has no administration in time range)  metoprolol succinate (TOPROL-XL) 24 hr tablet 50 mg (has no administration in time range)  levothyroxine (SYNTHROID) tablet 88 mcg (has no administration in time range)  pantoprazole (PROTONIX) EC tablet 40 mg (has no administration in time range)  Vitamin D (Ergocalciferol) (DRISDOL) 1.25 MG (50000 UNIT) capsule 50,000 Units (has no administration in time range)  loratadine (CLARITIN) tablet 10 mg (has no administration in time range)  fluticasone (FLONASE) 50 MCG/ACT nasal spray 2 spray (has no administration in time range)  ipratropium (ATROVENT) 0.03 % nasal spray 2 spray (has no administration in time range)  montelukast (SINGULAIR) tablet 10 mg (has no administration in time range)  dorzolamide-timolol (COSOPT) 2-0.5 % ophthalmic solution 1 drop (has no administration in time range)   latanoprost (XALATAN) 0.005 % ophthalmic solution 1 drop (has no administration in time range)  sodium chloride (MURO 128) 2 % ophthalmic solution 1 drop (has no administration in time range)  sodium chloride (MURO 128) 5 % ophthalmic ointment 1 Application (has no administration in time range)  HYDROcodone-acetaminophen (NORCO/VICODIN) 5-325 MG per tablet 1-2 tablet (has no administration in time range)  morphine (PF) 2 MG/ML injection 0.5 mg (has no administration in time range)  heparin injection 5,000 Units (has no administration in time range)  zolpidem (AMBIEN) tablet 5 mg (has no administration in time range)  bisacodyl (DULCOLAX) EC tablet 5 mg (has no administration in time  range)  tranexamic acid (CYKLOKAPRON) IVPB 1,000 mg (has no administration in time range)  morphine (PF) 4 MG/ML injection 4 mg (4 mg Intravenous Given 05/25/22 1323)  ondansetron (ZOFRAN) injection 4 mg (4 mg Intravenous Given 05/25/22 1322)  morphine (PF) 4 MG/ML injection 4 mg (4 mg Intravenous Given 05/25/22 1442)    ED Course/ Medical Decision Making/ A&P                             Medical Decision Making Amount and/or Complexity of Data Reviewed Labs: ordered. Radiology: ordered.  Risk Prescription drug management. Decision regarding hospitalization.   This patient presents to the ED for concern of fall, this involves an extensive number of treatment options, and is a complaint that carries with it a high risk of complications and morbidity.  The differential diagnosis includes fx, contusion, strain   Co morbidities that complicate the patient evaluation  asthma, htn, gerd, hypothyroidism, ibs, depression, glaucoma, and chronic mesenteric artery stenosis    Additional history obtained:  Additional history obtained from epic chart review External records from outside source obtained and reviewed including EMS report   Lab Tests:  I Ordered, and personally interpreted labs.  The pertinent  results include:  cbc nl, bmp nl, inr 1.0   Imaging Studies ordered:  I ordered imaging studies including cxr, sacrum/coccyx, right knee, hip  I independently visualized and interpreted imaging which showed  CXR: No active disease.  Knee: No acute fracture or dislocation.  2. Small-moderate knee joint effusion, nonspecific.  3. Moderate degenerative changes of the right knee.  Sacrum:  Limited exam without evidence of sacrococcygeal fracture.  2. Acute right femoral neck fracture.  Hip: Acute comminuted fracture of the right femoral neck with fracture  impaction and shortening.   CT head/c-spine: No acute intracranial abnormality. Mild cerebral atrophy and chronic  small vessel disease.    No evidence of acute cervical spine fracture.    Degenerative spondylosis. 4 mm anterolisthesis at C3-4, likely due  to bilateral facet DJD at this level. Recommend clinical  correlation, and consider flexion-extension views of the cervical  spine to assess for ligamentous instability.   I agree with the radiologist interpretation   Cardiac Monitoring:  The patient was maintained on a cardiac monitor.  I personally viewed and interpreted the cardiac monitored which showed an underlying rhythm of: nsr   Medicines ordered and prescription drug management:  I ordered medication including morphine/zofran  for pain  Reevaluation of the patient after these medicines showed that the patient improved I have reviewed the patients home medicines and have made adjustments as needed   Test Considered:  ct   Critical Interventions:  Pain control   Consultations Obtained:  I requested consultation with the orthopedist (pt requests Dr. Charlann Boxer); so I spoke with Dr. Linna Caprice ([facility]) who recommended hospitalist admission to [facility] Pt d/w Dr. Laural Benes ([facility]) for admission.  Problem List / ED Course:  Fall with right femoral neck fx:  pt requests Dr. Nilsa Nutting group, so pt will be admitted to [facility].   [facility] will consult when she arrives.   Reevaluation:  After the interventions noted above, I reevaluated the patient and found that they have :improved   Social Determinants of Health:  Lives at home   Dispostion:  After consideration of the diagnostic results and the patients response to treatment, I feel that the patent would benefit from admission.  Final Clinical Impression(s) / ED Diagnoses Final diagnoses:  Fall, initial encounter  Closed fracture of neck of right femur, initial encounter    Rx / DC Orders ED Discharge Orders     None         Jacalyn Lefevre, MD 05/25/22 1502

## 2022-05-25 NOTE — ED Notes (Signed)
Patient restless in bed , repositioned with pillow support for comfort. Notified EDP for pain medictaion

## 2022-05-25 NOTE — ED Notes (Signed)
Patient returned from CT

## 2022-05-26 ENCOUNTER — Inpatient Hospital Stay (HOSPITAL_COMMUNITY): Payer: Medicare Other | Admitting: Anesthesiology

## 2022-05-26 ENCOUNTER — Inpatient Hospital Stay (HOSPITAL_COMMUNITY): Payer: Medicare Other

## 2022-05-26 ENCOUNTER — Encounter (HOSPITAL_COMMUNITY)
Admission: EM | Disposition: A | Discharge status: Skilled Nursing Facility | Payer: Self-pay | Source: Home / Self Care | Attending: Internal Medicine

## 2022-05-26 ENCOUNTER — Other Ambulatory Visit: Payer: Self-pay

## 2022-05-26 DIAGNOSIS — I10 Essential (primary) hypertension: Secondary | ICD-10-CM

## 2022-05-26 DIAGNOSIS — I739 Peripheral vascular disease, unspecified: Secondary | ICD-10-CM | POA: Diagnosis not present

## 2022-05-26 DIAGNOSIS — E039 Hypothyroidism, unspecified: Secondary | ICD-10-CM | POA: Diagnosis not present

## 2022-05-26 DIAGNOSIS — S72001A Fracture of unspecified part of neck of right femur, initial encounter for closed fracture: Secondary | ICD-10-CM

## 2022-05-26 HISTORY — PX: HIP ARTHROPLASTY: SHX981

## 2022-05-26 LAB — BASIC METABOLIC PANEL
Anion gap: 8 (ref 5–15)
BUN: 16 mg/dL (ref 8–23)
CO2: 24 mmol/L (ref 22–32)
Calcium: 8.6 mg/dL — ABNORMAL LOW (ref 8.9–10.3)
Chloride: 103 mmol/L (ref 98–111)
Creatinine, Ser: 0.78 mg/dL (ref 0.44–1.00)
GFR, Estimated: >60 mL/min (ref >60)
Glucose, Bld: 137 mg/dL — ABNORMAL HIGH (ref 70–99)
Potassium: 3.7 mmol/L (ref 3.5–5.1)
Sodium: 135 mmol/L (ref 135–145)

## 2022-05-26 LAB — CBC
HCT: 40.4 % (ref 36.0–46.0)
Hemoglobin: 13.4 g/dL (ref 12.0–15.0)
MCH: 32.2 pg (ref 26.0–34.0)
MCHC: 33.2 g/dL (ref 30.0–36.0)
MCV: 97.1 fL (ref 80.0–100.0)
Platelets: 195 10*3/uL (ref 150–400)
RBC: 4.16 MIL/uL (ref 3.87–5.11)
RDW: 11.9 % (ref 11.5–15.5)
WBC: 8.4 10*3/uL (ref 4.0–10.5)
nRBC: 0 % (ref 0.0–0.2)

## 2022-05-26 LAB — SURGICAL PCR SCREEN
MRSA, PCR: NEGATIVE
Staphylococcus aureus: NEGATIVE

## 2022-05-26 SURGERY — HEMIARTHROPLASTY, HIP, DIRECT ANTERIOR APPROACH, FOR FRACTURE
Anesthesia: Spinal | Site: Hip | Laterality: Right

## 2022-05-26 MED ORDER — TRANEXAMIC ACID-NACL 1000-0.7 MG/100ML-% IV SOLN
1000.0000 mg | INTRAVENOUS | Status: AC
  Administered 2022-05-26: 1000 mg via INTRAVENOUS

## 2022-05-26 MED ORDER — FENTANYL CITRATE PF 50 MCG/ML IJ SOSY
PREFILLED_SYRINGE | INTRAMUSCULAR | Status: AC
  Filled 2022-05-26: qty 1

## 2022-05-26 MED ORDER — 0.9 % SODIUM CHLORIDE (POUR BTL) OPTIME
TOPICAL | Status: DC | PRN
  Administered 2022-05-26: 1000 mL

## 2022-05-26 MED ORDER — CEFAZOLIN SODIUM-DEXTROSE 2-3 GM-%(50ML) IV SOLR
INTRAVENOUS | Status: DC | PRN
  Administered 2022-05-26: 2 g via INTRAVENOUS

## 2022-05-26 MED ORDER — FENTANYL CITRATE PF 50 MCG/ML IJ SOSY
25.0000 ug | PREFILLED_SYRINGE | INTRAMUSCULAR | Status: DC | PRN
  Administered 2022-05-26: 25 ug via INTRAVENOUS

## 2022-05-26 MED ORDER — STERILE WATER FOR IRRIGATION IR SOLN
Status: DC | PRN
  Administered 2022-05-26: 2000 mL

## 2022-05-26 MED ORDER — METOCLOPRAMIDE HCL 5 MG PO TABS: 5.0000 mg | ORAL_TABLET | Freq: Three times a day (TID) | ORAL | Status: DC | PRN

## 2022-05-26 MED ORDER — VANCOMYCIN HCL 1000 MG IV SOLR
INTRAVENOUS | Status: DC | PRN
  Administered 2022-05-26: 1000 mg via TOPICAL

## 2022-05-26 MED ORDER — OXYCODONE HCL 5 MG/5ML PO SOLN: 5.0000 mg | Freq: Once | ORAL | Status: DC | PRN

## 2022-05-26 MED ORDER — PROPOFOL 500 MG/50ML IV EMUL
INTRAVENOUS | Status: DC | PRN
  Administered 2022-05-26: 35 ug/kg/min via INTRAVENOUS
  Administered 2022-05-26: 30 mg via INTRAVENOUS
  Administered 2022-05-26: 100 mg via INTRAVENOUS
  Administered 2022-05-26: 20 mg via INTRAVENOUS

## 2022-05-26 MED ORDER — KETOROLAC TROMETHAMINE 30 MG/ML IJ SOLN
INTRAMUSCULAR | Status: DC | PRN
  Administered 2022-05-26: 30 mg

## 2022-05-26 MED ORDER — SODIUM CHLORIDE 0.9 % IV SOLN: INTRAVENOUS | Status: DC

## 2022-05-26 MED ORDER — AMISULPRIDE (ANTIEMETIC) 5 MG/2ML IV SOLN
INTRAVENOUS | Status: AC
  Filled 2022-05-26: qty 4

## 2022-05-26 MED ORDER — BUPIVACAINE IN DEXTROSE 0.75-8.25 % IT SOLN
INTRATHECAL | Status: DC | PRN
  Administered 2022-05-26: 1.6 mL via INTRATHECAL

## 2022-05-26 MED ORDER — SODIUM CHLORIDE (PF) 0.9 % IJ SOLN
INTRAMUSCULAR | Status: DC | PRN
  Administered 2022-05-26: 30 mL

## 2022-05-26 MED ORDER — DEXMEDETOMIDINE HCL IN NACL 80 MCG/20ML IV SOLN
INTRAVENOUS | Status: AC
  Filled 2022-05-26: qty 20

## 2022-05-26 MED ORDER — DIPHENHYDRAMINE HCL 12.5 MG/5ML PO ELIX: 12.5000 mg | ORAL_SOLUTION | ORAL | Status: DC | PRN

## 2022-05-26 MED ORDER — ACETAMINOPHEN 500 MG PO TABS: 1000.0000 mg | ORAL_TABLET | Freq: Once | ORAL | Status: DC

## 2022-05-26 MED ORDER — LACTATED RINGERS IV SOLN: INTRAVENOUS | Status: DC | PRN

## 2022-05-26 MED ORDER — KETOROLAC TROMETHAMINE 30 MG/ML IJ SOLN
INTRAMUSCULAR | Status: AC
  Filled 2022-05-26: qty 1

## 2022-05-26 MED ORDER — PHENYLEPHRINE HCL-NACL 20-0.9 MG/250ML-% IV SOLN
INTRAVENOUS | Status: DC | PRN
  Administered 2022-05-26: 80 ug/min via INTRAVENOUS
  Administered 2022-05-26: 80 ug via INTRAVENOUS
  Administered 2022-05-26: 25 ug via INTRAVENOUS

## 2022-05-26 MED ORDER — METHOCARBAMOL 500 MG PO TABS
500.0000 mg | ORAL_TABLET | Freq: Four times a day (QID) | ORAL | Status: DC | PRN
  Administered 2022-05-26 – 2022-05-29 (×7): 500 mg via ORAL
  Filled 2022-05-26 (×7): qty 1

## 2022-05-26 MED ORDER — POVIDONE-IODINE 10 % EX SWAB: 2.0000 | Freq: Once | CUTANEOUS | Status: DC

## 2022-05-26 MED ORDER — SODIUM CHLORIDE 0.9 % IR SOLN
Status: DC | PRN
  Administered 2022-05-26: 3000 mL

## 2022-05-26 MED ORDER — ASPIRIN 81 MG PO CHEW
81.0000 mg | CHEWABLE_TABLET | Freq: Two times a day (BID) | ORAL | Status: DC
  Administered 2022-05-26 – 2022-05-29 (×6): 81 mg via ORAL
  Filled 2022-05-26 (×6): qty 1

## 2022-05-26 MED ORDER — DEXAMETHASONE SODIUM PHOSPHATE 10 MG/ML IJ SOLN
INTRAMUSCULAR | Status: DC | PRN
  Administered 2022-05-26: 8 mg via INTRAVENOUS

## 2022-05-26 MED ORDER — AMISULPRIDE (ANTIEMETIC) 5 MG/2ML IV SOLN
10.0000 mg | Freq: Once | INTRAVENOUS | Status: AC
  Administered 2022-05-26: 10 mg via INTRAVENOUS

## 2022-05-26 MED ORDER — BUPIVACAINE-EPINEPHRINE (PF) 0.25% -1:200000 IJ SOLN
INTRAMUSCULAR | Status: AC
  Filled 2022-05-26: qty 30

## 2022-05-26 MED ORDER — TRANEXAMIC ACID-NACL 1000-0.7 MG/100ML-% IV SOLN
INTRAVENOUS | Status: AC
  Filled 2022-05-26: qty 100

## 2022-05-26 MED ORDER — CEFAZOLIN SODIUM-DEXTROSE 2-4 GM/100ML-% IV SOLN
INTRAVENOUS | Status: AC
  Filled 2022-05-26: qty 100

## 2022-05-26 MED ORDER — SODIUM CHLORIDE (PF) 0.9 % IJ SOLN
INTRAMUSCULAR | Status: AC
  Filled 2022-05-26: qty 50

## 2022-05-26 MED ORDER — CEFAZOLIN SODIUM-DEXTROSE 2-4 GM/100ML-% IV SOLN
2.0000 g | INTRAVENOUS | Status: AC
  Administered 2022-05-26: 2 g via INTRAVENOUS

## 2022-05-26 MED ORDER — ISOPROPYL ALCOHOL 70 % SOLN
Status: AC
  Filled 2022-05-26: qty 480

## 2022-05-26 MED ORDER — ALBUMIN HUMAN 5 % IV SOLN: INTRAVENOUS | Status: DC | PRN

## 2022-05-26 MED ORDER — ALUM & MAG HYDROXIDE-SIMETH 200-200-20 MG/5ML PO SUSP: 30.0000 mL | ORAL | Status: DC | PRN

## 2022-05-26 MED ORDER — CEFAZOLIN SODIUM-DEXTROSE 2-4 GM/100ML-% IV SOLN
2.0000 g | Freq: Four times a day (QID) | INTRAVENOUS | Status: AC
  Administered 2022-05-26 (×2): 2 g via INTRAVENOUS
  Filled 2022-05-26 (×2): qty 100

## 2022-05-26 MED ORDER — ACETAMINOPHEN 325 MG PO TABS
325.0000 mg | ORAL_TABLET | Freq: Four times a day (QID) | ORAL | Status: DC | PRN
  Administered 2022-05-27 – 2022-05-29 (×7): 650 mg via ORAL
  Filled 2022-05-26 (×8): qty 2

## 2022-05-26 MED ORDER — DEXMEDETOMIDINE HCL IN NACL 80 MCG/20ML IV SOLN
INTRAVENOUS | Status: DC | PRN
  Administered 2022-05-26 (×2): 4 ug via BUCCAL

## 2022-05-26 MED ORDER — ONDANSETRON HCL 4 MG/2ML IJ SOLN
INTRAMUSCULAR | Status: AC
  Filled 2022-05-26: qty 2

## 2022-05-26 MED ORDER — PHENOL 1.4 % MT LIQD: 1.0000 | OROMUCOSAL | Status: DC | PRN

## 2022-05-26 MED ORDER — FENTANYL CITRATE (PF) 100 MCG/2ML IJ SOLN
INTRAMUSCULAR | Status: DC | PRN
  Administered 2022-05-26: 25 ug via INTRAVENOUS
  Administered 2022-05-26: 50 ug via INTRAVENOUS
  Administered 2022-05-26 (×5): 25 ug via INTRAVENOUS

## 2022-05-26 MED ORDER — MORPHINE SULFATE (PF) 2 MG/ML IV SOLN
0.5000 mg | INTRAVENOUS | Status: DC | PRN
  Administered 2022-05-27: 1 mg via INTRAVENOUS
  Filled 2022-05-26: qty 1

## 2022-05-26 MED ORDER — LIDOCAINE 2% (20 MG/ML) 5 ML SYRINGE
INTRAMUSCULAR | Status: DC | PRN
  Administered 2022-05-26: 40 mg via INTRAVENOUS

## 2022-05-26 MED ORDER — ACETAMINOPHEN 10 MG/ML IV SOLN
INTRAVENOUS | Status: AC
  Filled 2022-05-26: qty 100

## 2022-05-26 MED ORDER — HYDROCODONE-ACETAMINOPHEN 5-325 MG PO TABS
1.0000 | ORAL_TABLET | ORAL | Status: DC | PRN
  Administered 2022-05-26 – 2022-05-27 (×2): 2 via ORAL
  Administered 2022-05-28 – 2022-05-29 (×2): 1 via ORAL
  Filled 2022-05-26 (×2): qty 2
  Filled 2022-05-26 (×2): qty 1

## 2022-05-26 MED ORDER — METOCLOPRAMIDE HCL 5 MG/ML IJ SOLN: 5.0000 mg | Freq: Three times a day (TID) | INTRAMUSCULAR | Status: DC | PRN

## 2022-05-26 MED ORDER — DOCUSATE SODIUM 100 MG PO CAPS
100.0000 mg | ORAL_CAPSULE | Freq: Two times a day (BID) | ORAL | Status: DC
  Administered 2022-05-26 – 2022-05-29 (×6): 100 mg via ORAL
  Filled 2022-05-26 (×6): qty 1

## 2022-05-26 MED ORDER — CHLORHEXIDINE GLUCONATE 4 % EX LIQD: 60.0000 mL | Freq: Once | CUTANEOUS | Status: DC

## 2022-05-26 MED ORDER — OXYCODONE HCL 5 MG PO TABS: 5.0000 mg | ORAL_TABLET | Freq: Once | ORAL | Status: DC | PRN

## 2022-05-26 MED ORDER — ONDANSETRON HCL 4 MG PO TABS
4.0000 mg | ORAL_TABLET | Freq: Four times a day (QID) | ORAL | Status: DC | PRN
  Administered 2022-05-27: 4 mg via ORAL
  Filled 2022-05-26: qty 1

## 2022-05-26 MED ORDER — PHENYLEPHRINE HCL-NACL 20-0.9 MG/250ML-% IV SOLN: INTRAVENOUS | Status: DC | PRN

## 2022-05-26 MED ORDER — HYDROCODONE-ACETAMINOPHEN 7.5-325 MG PO TABS: 1.0000 | ORAL_TABLET | ORAL | Status: DC | PRN

## 2022-05-26 MED ORDER — MENTHOL 3 MG MT LOZG: 1.0000 | LOZENGE | OROMUCOSAL | Status: DC | PRN

## 2022-05-26 MED ORDER — VANCOMYCIN HCL 1000 MG IV SOLR
INTRAVENOUS | Status: AC
  Filled 2022-05-26: qty 20

## 2022-05-26 MED ORDER — PRONTOSAN WOUND IRRIGATION OPTIME
TOPICAL | Status: DC | PRN
  Administered 2022-05-26: 1 via TOPICAL

## 2022-05-26 MED ORDER — METHOCARBAMOL 1000 MG/10ML IJ SOLN: 500.0000 mg | Freq: Four times a day (QID) | INTRAVENOUS | Status: DC | PRN

## 2022-05-26 MED ORDER — ISOPROPYL ALCOHOL 70 % SOLN
Status: DC | PRN
  Administered 2022-05-26: 1 via TOPICAL

## 2022-05-26 MED ORDER — SENNA 8.6 MG PO TABS
1.0000 | ORAL_TABLET | Freq: Two times a day (BID) | ORAL | Status: DC
  Administered 2022-05-26 – 2022-05-29 (×6): 8.6 mg via ORAL
  Filled 2022-05-26 (×7): qty 1

## 2022-05-26 MED ORDER — BUPIVACAINE-EPINEPHRINE 0.25% -1:200000 IJ SOLN
INTRAMUSCULAR | Status: DC | PRN
  Administered 2022-05-26: 30 mL

## 2022-05-26 MED ORDER — ONDANSETRON HCL 4 MG/2ML IJ SOLN
4.0000 mg | Freq: Four times a day (QID) | INTRAMUSCULAR | Status: DC | PRN
  Administered 2022-05-26: 4 mg via INTRAVENOUS
  Filled 2022-05-26: qty 2

## 2022-05-26 MED ORDER — POLYETHYLENE GLYCOL 3350 17 G PO PACK: 17.0000 g | PACK | Freq: Every day | ORAL | Status: DC | PRN

## 2022-05-26 SURGICAL SUPPLY — 69 items
ADH SKN CLS APL DERMABOND .7 (GAUZE/BANDAGES/DRESSINGS) ×2
APL PRP STRL LF DISP 70% ISPRP (MISCELLANEOUS) ×1
BAG COUNTER SPONGE SURGICOUNT (BAG) IMPLANT
BAG SPEC THK2 15X12 ZIP CLS (MISCELLANEOUS)
BAG SPNG CNTER NS LX DISP (BAG)
BAG ZIPLOCK 12X15 (MISCELLANEOUS) IMPLANT
CABLE CERLAGE W/CRIMP 1.8 (Cable) IMPLANT
CHLORAPREP W/TINT 26 (MISCELLANEOUS) ×2 IMPLANT
COVER PERINEAL POST (MISCELLANEOUS) ×2 IMPLANT
COVER SURGICAL LIGHT HANDLE (MISCELLANEOUS) ×2 IMPLANT
DERMABOND ADVANCED .7 DNX12 (GAUZE/BANDAGES/DRESSINGS) ×4 IMPLANT
DRAPE IMP U-DRAPE 54X76 (DRAPES) ×2 IMPLANT
DRAPE SHEET LG 3/4 BI-LAMINATE (DRAPES) ×4 IMPLANT
DRAPE STERI IOBAN 125X83 (DRAPES) ×2 IMPLANT
DRAPE U-SHAPE 47X51 STRL (DRAPES) ×4 IMPLANT
DRESSING AQUACEL AG SP 3.5X10 (GAUZE/BANDAGES/DRESSINGS) IMPLANT
DRSG AQUACEL AG ADV 3.5X10 (GAUZE/BANDAGES/DRESSINGS) ×2 IMPLANT
DRSG AQUACEL AG ADV 3.5X14 (GAUZE/BANDAGES/DRESSINGS) IMPLANT
DRSG AQUACEL AG SP 3.5X10 (GAUZE/BANDAGES/DRESSINGS) ×1
DRSG TEGADERM 4X4.75 (GAUZE/BANDAGES/DRESSINGS) IMPLANT
ELECT REM PT RETURN 15FT ADLT (MISCELLANEOUS) ×4 IMPLANT
GAUZE SPONGE 2X2 8PLY STRL LF (GAUZE/BANDAGES/DRESSINGS) IMPLANT
GAUZE SPONGE 4X4 12PLY STRL (GAUZE/BANDAGES/DRESSINGS) ×2 IMPLANT
GLOVE BIO SURGEON STRL SZ 6.5 (GLOVE) IMPLANT
GLOVE BIO SURGEON STRL SZ8 (GLOVE) IMPLANT
GLOVE BIO SURGEON STRL SZ8.5 (GLOVE) ×4 IMPLANT
GLOVE BIOGEL PI IND STRL 6.5 (GLOVE) IMPLANT
GLOVE BIOGEL PI IND STRL 7.0 (GLOVE) IMPLANT
GLOVE BIOGEL PI IND STRL 7.5 (GLOVE) ×2 IMPLANT
GLOVE BIOGEL PI IND STRL 8 (GLOVE) ×2 IMPLANT
GLOVE BIOGEL PI IND STRL 8.5 (GLOVE) ×2 IMPLANT
GLOVE SURG LX STRL 8.0 MICRO (GLOVE) ×4 IMPLANT
GOWN SPEC L3 XXLG W/TWL (GOWN DISPOSABLE) ×2 IMPLANT
GOWN STRL REUS W/ TWL LRG LVL3 (GOWN DISPOSABLE) ×2 IMPLANT
GOWN STRL REUS W/TWL LRG LVL3 (GOWN DISPOSABLE) ×3
HANDPIECE INTERPULSE COAX TIP (DISPOSABLE)
HEAD FEM UNIPOLAR 48 OD (Hips) IMPLANT
HEAD MOD COCR 28MM HD +3MM NK (Orthopedic Implant) IMPLANT
HOOD PEEL AWAY T7 (MISCELLANEOUS) ×4 IMPLANT
JET LAVAGE IRRISEPT WOUND (IRRIGATION / IRRIGATOR)
KIT TURNOVER KIT A (KITS) IMPLANT
LAVAGE JET IRRISEPT WOUND (IRRIGATION / IRRIGATOR) IMPLANT
MANIFOLD NEPTUNE II (INSTRUMENTS) ×2 IMPLANT
MARKER SKIN DUAL TIP RULER LAB (MISCELLANEOUS) ×2 IMPLANT
NDL SPNL 18GX3.5 QUINCKE PK (NEEDLE) ×2 IMPLANT
NEEDLE SPNL 18GX3.5 QUINCKE PK (NEEDLE) ×1 IMPLANT
PACK ANTERIOR HIP CUSTOM (KITS) ×2 IMPLANT
PENCIL SMOKE EVACUATOR (MISCELLANEOUS) IMPLANT
RINGBLOC BI POLAR 28X47MM (Orthopedic Implant) ×1 IMPLANT
SAW OSC TIP CART 19.5X105X1.3 (SAW) ×2 IMPLANT
SEALER BIPOLAR AQUA 6.0 (INSTRUMENTS) IMPLANT
SET HNDPC FAN SPRY TIP SCT (DISPOSABLE) IMPLANT
SHELL RINGBLOC BI POLR 28X47MM (Orthopedic Implant) IMPLANT
SPACER FEM TAPERED +0 12/14 (Hips) IMPLANT
SPIKE FLUID TRANSFER (MISCELLANEOUS) ×2 IMPLANT
STEM CMTLS WAGNER 18 225 135D (Stem) IMPLANT
STEM FEM HIP HO ARC 18X175 (Stem) IMPLANT
STEM FEM HO ARCOS 20X175 (Stem) IMPLANT
SUT ETHIBOND NAB CT1 #1 30IN (SUTURE) ×4 IMPLANT
SUT MNCRL AB 3-0 PS2 18 (SUTURE) ×2 IMPLANT
SUT MNCRL AB 4-0 PS2 18 (SUTURE) ×2 IMPLANT
SUT MON AB 2-0 CT1 36 (SUTURE) ×4 IMPLANT
SUT STRATAFIX PDO 1 14 VIOLET (SUTURE)
SUT STRATFX PDO 1 14 VIOLET (SUTURE)
SUT VIC AB 2-0 CT1 27 (SUTURE) ×1
SUT VIC AB 2-0 CT1 TAPERPNT 27 (SUTURE) ×2 IMPLANT
SUT VLOC 180 0 24IN GS25 (SUTURE) ×2 IMPLANT
SUTURE STRATFX PDO 1 14 VIOLET (SUTURE) IMPLANT
SYR 50ML LL SCALE MARK (SYRINGE) IMPLANT

## 2022-05-26 NOTE — Anesthesia Procedure Notes (Signed)
Spinal  Patient location during procedure: OR Start time: 05/26/2022 9:16 AM End time: 05/26/2022 9:19 AM Reason for block: surgical anesthesia Staffing Performed: anesthesiologist  Anesthesiologist: Kaylyn Layer, MD Performed by: Kaylyn Layer, MD Authorized by: Kaylyn Layer, MD   Preanesthetic Checklist Completed: patient identified, IV checked, risks and benefits discussed, surgical consent, monitors and equipment checked, pre-op evaluation and timeout performed Spinal Block Patient position: right lateral decubitus Prep: DuraPrep and site prepped and draped Patient monitoring: continuous pulse ox, blood pressure and heart rate Approach: midline Location: L3-4 Injection technique: single-shot Needle Needle type: Whitacre  Needle gauge: 22 G Needle length: 9 cm Assessment Events: CSF return Additional Notes Risks, benefits, and alternative discussed. Patient gave consent to procedure. Prepped and draped in right lateral position. Clear CSF obtained after one needle redirection. Positive terminal aspiration. No pain or paraesthesias with injection. Patient tolerated procedure well. Vital signs stable. Amalia Greenhouse, MD

## 2022-05-26 NOTE — Op Note (Addendum)
OPERATIVE REPORT  SURGEON: Samson Frederic, MD   ASSISTANT: Staff.  PREOPERATIVE DIAGNOSIS: Displaced Right femoral neck fracture.   POSTOPERATIVE DIAGNOSIS: Displaced Right femoral neck fracture.   PROCEDURE: Right hip hemiarthroplasty, anterior approach.   IMPLANTS: Zimmer Wagner SL hip stem, size 18 x 225 mm, 12/14 taper. DePuy +0 mm taper adapter and a 48 mm monopolar head ball. Zimmer 1.8 mm adult reconstruction cable x 6.  ANESTHESIA:  General and Spinal  ANTIBIOTICS: 2g ancef.  ESTIMATED BLOOD LOSS:-600 mL    DRAINS: None.  COMPLICATIONS: Intraoperative femur fracture requiring femoral component revision.   CONDITION: PACU - hemodynamically stable.   BRIEF CLINICAL NOTE: Megan Foster is a 89 y.o. female with a displaced Right femoral neck fracture. The patient was admitted to the hospitalist service and underwent perioperative risk stratification and medical optimization. The risks, benefits, and alternatives to hemiarthroplasty were explained, and the patient elected to proceed.  PROCEDURE IN DETAIL: The patient was taken to the operating room and general anesthesia was induced on the hospital bed.  The patient was then positioned on the Hana table.  All bony prominences were well padded.  The hip was prepped and draped in the normal sterile surgical fashion.  A time-out was called verifying side and site of surgery. Antibiotics were given within 60 minutes of beginning the procedure.   Bikini incision was made, and the direct anterior approach to the hip was performed through the Hueter interval.  Superficial dissection was carried out lateral to the ASIS. Lateral femoral circumflex vessels were treated with the Auqumantys. The anterior capsule was exposed and an inverted T capsulotomy was made. Fracture hematoma was encountered and evacuated. The patient was found to have a comminuted Right subcapital femoral neck fracture.  Inferior pubofemoral ligament was released  subperiosteally to the lesser trochanter. I freshened the femoral neck cut with a saw.  I removed the femoral neck fragment.  A corkscrew was placed into the head and the head was removed.  This was passed to the back table and was measured.   Acetabular exposure was achieved.  I examined the articular cartilage which was intact.  The labrum was intact. A 48 mm trial head was placed and found to have excellent fit.   I then gained femoral exposure taking care to protect the abductors and greater trochanter.  The superior capsule was incised longitudinally, staying lateral to the posterior border of the femoral neck. External rotation, extension, and adduction were applied.  A cookie cutter was used to enter the femoral canal, and then the femoral canal finder was used to confirm location.  I then sequentially broached up to a size 18 Taperloc Reduced Distal broach.  Due to poor cancellous bone quality of the proximal femur, the broach did not exhibit adequate rotational stability.  At this point, I elected to proceed with preparing for an Arcos One Piece stem.  I sequentially reamed up to a 17.5 mm reamer and broached for an 18 mm stem.  I placed a high offset trial neck and a trial hemiarthroplasty construct.  The hip was reduced, and leg lengths were checked with fluoroscopy.  The hip was gently dislocated and the trial components were removed.  I placed the real 18 mm stem, and it was noted to sit deeper than the trial.  At this point, I obtained fluoroscopy views and was unable to identify a fracture.  I broached again with an 18 mm broach, and there was insufficient rotational stability and fit.  I therefore reamed up to a 19.5 mm reamer and broached for a 20 mm stem.  I placed a high offset trial neck and the trial head ball construct.  The hip was reduced.  AP and and lateral fluoroscopy views were obtained, and no periprosthetic fracture was identified.  I then placed the real 20 mm stem, which sat at  the exact same depth as the trial.  I then obtained AP and lateral fluoroscopy views and determined that there was a fracture visible from the tip of the stem to a couple centimeters distal.  I then removed the femoral component using the threaded insertion handle.  I used fluoroscopy to identify the exact location of the periprosthetic fracture.  I was able to determine that there was a longitudinal fracture that began in the subtrochanteric region of the femur, without proximal communication, and the fracture exited the lateral cortex of the femoral shaft.  I made a longitudinal incision over the distal aspect of the periprosthetic fracture.  The IT band was split in line with fibers.  I dissected the vastus lateralis off the posterior intermuscular septum, and I reflected the vastus lateralis anteriorly.  All perforators were treated with aquamantis bipolar cautery.  I visually identified the periprosthetic fracture.  I placed a single subperiosteal prophylactic cable just distal to the tip of the stem.  Proximal to this area, over the fracture, I placed a total of 4 subperiosteal adult reconstruction cables.  I returned my attention to the proximal incision.  I placed a single prophylactic adult reconstruction cable subperiosteally just distal to the lesser trochanter.  Once the fracture was cabled, I then proceeded with preparing the femur for a 225 mm Wagner SL stem, which was the longest implant available in house.  I reamed up to an 18 mm reamer, and I placed the trial femoral component.  Since the [address] uses a 12/14 taper, the only compatible head system available in house was made by DePuy.  I placed a 48+0 mm trial monopolar head ball, and the hip was reduced.  Fracture reduction, femoral component position, and leg lengths were checked fluoroscopically.  There was no propagation of the fracture.  The hip was dislocated and trial components were removed.  I placed the real stem followed by the real  spacer and head ball.  A single reduction maneuver was performed and the hip was reduced.  The cables were tightened and clipped.  Fluoroscopy was used to confirm component position and leg lengths.  The fracture was anatomically reduced.  At 90 degrees of external rotation and extension, the hip was stable to an anterior directed force.  I visually inspected the fracture site through the distal incision, and there was no gapping noted.   The wound was copiously irrigated with Prontosan solution and normal saline using pulse lavage.  I placed 1 g of vancomycin powder along the fracture site and within the hip joint.  Marcaine solution was injected into the periarticular soft tissue.  The wound was closed in layers using #1 Stratafix for the fascia, 2-0 Vicryl for the subcutaneous fat, 2-0 Monocryl for the deep dermal layer, and skin staples plus Dermabond for the skin.  Once the glue was fully dried, two Aquacell Ag dressings were applied.  The patient was then awakened from anesthesia and transported to the recovery room in stable condition.  Sponge, needle, and instrument counts were correct at the end of the case x2.  The patient tolerated the procedure well and  there were no known complications.  The aquamantis was utilized for this case to help facilitate better hemostasis as patient was felt to be at increased risk of bleeding because of complex case requiring increased OR time and/or exposure - minimally invasive approach.  A oscillating saw tip was utilized for this case to prevent damage to the soft tissue structures such as muscles, ligaments and tendons, and to ensure accurate bone cuts. This patient was at increased risk for above structures due to  minimally invasive approach.   POSTOPERATIVE PLAN: The patient be readmitted to the hospitalist service.  No hip precautions.  Touchdown weightbearing right lower extremity with a walker.  Mobilize out of bed with PT/OT.  Routine prophylactic IV  antibiotics.  We will begin aspirin for DVT prophylaxis.  Due to her weightbearing restriction, she will likely need SNF placement.  She will follow-up in the office 2 weeks after discharge for routine postoperative care.  I did discuss intraoperative events with her niece, Efraim Kaufmann.

## 2022-05-26 NOTE — Progress Notes (Signed)
PROGRESS NOTE  Megan Foster ZOX:[id number] DOB: 1927/09/23 DOA: 05/25/2022 PCP: Cleatis Polka., MD   LOS: 1 day   Brief Narrative / Interim history: 89 year old female, retired Glass blower/designer, with osteoporosis, chronic mesenteric ischemia with SMA stenosis status post balloon angioplasty, esophageal dysmotility, GERD, HTN, HLD, hiatal hernia, vitamin D deficiency who comes into the hospital after a fall and subsequent hip fracture.  She initially presented to [facility], and was transferred to [facility] for orthopedic surgery consultation.  Subjective / 24h Interval events: She is doing well this morning, awaiting surgery.  Denies any chest pain, denies any shortness of breath  Assesement and Plan: Principal Problem:   Closed displaced fracture of right femoral neck Active Problems:   Hyperlipidemia   ESOPHAGEAL MOTILITY DISORDER   GERD   Palpitations   HTN (hypertension)   Superior mesenteric artery stenosis   Chronic mesenteric ischemia   Fall at home  Principal problem Right femoral neck fracture -orthopedic surgery consulted, discussed with Dr. Linna Caprice last night, looks like she will go for operative repair today.  Mobilize postoperatively per PT, DVT prophylaxis per orthopedic surgery.  Active problems Essential hypertension -continue home metoprolol  Hypothyroidism -continue home Synthroid  Osteoporosis, Vitamin D deficiency -vitamin D levels are actually quite high, will likely need to pause her vitamin D for a month or so and have it rechecked as an outpatient  Hyperlipidemia -continue atorvastatin  Chronic allergic rhinitis -continue home medications  Scheduled Meds:  acetaminophen  1,000 mg Oral Once   acetaminophen  1,000 mg Oral Once   [MAR Hold] atorvastatin  20 mg Oral QHS   chlorhexidine  60 mL Topical Once   [MAR Hold] timolol  1 drop Right Eye BID   And   [MAR Hold] dorzolamide  1 drop Right Eye BID   [MAR Hold]  fluticasone  2 spray Each Nare Daily   [MAR Hold] heparin  5,000 Units Subcutaneous Q8H   [MAR Hold] ipratropium  2 spray Each Nare Daily   [MAR Hold] latanoprost  1 drop Both Eyes QHS   [MAR Hold] levothyroxine  88 mcg Oral QAC breakfast   [MAR Hold] loratadine  10 mg Oral q morning   [MAR Hold] metoprolol succinate  50 mg Oral Daily   [MAR Hold] montelukast  10 mg Oral QHS   [MAR Hold] pantoprazole  40 mg Oral BID   povidone-iodine  2 Application Topical Once   povidone-iodine  2 Application Topical Once   [MAR Hold] sodium chloride  1 drop Both Eyes TID AC & HS   [MAR Hold] Vitamin D (Ergocalciferol)  50,000 Units Oral Q Wed   Continuous Infusions:  sodium chloride 75 mL/hr at 05/25/22 2225   tranexamic acid     PRN Meds:.[MAR Hold] bisacodyl, fentaNYL (SUBLIMAZE) injection, [MAR Hold] HYDROcodone-acetaminophen, [MAR Hold]  morphine injection, [MAR Hold] ondansetron (ZOFRAN) IV, oxyCODONE **OR** oxyCODONE, [MAR Hold] zolpidem  Current Outpatient Medications  Medication Instructions   acetaminophen (TYLENOL) 1,000 mg, Oral, 2 times daily   aspirin 81 mg, Oral, Daily   atorvastatin (LIPITOR) 20 mg, Oral, Daily at bedtime   dorzolamide-timolol (COSOPT) 2-0.5 % ophthalmic solution 1 drop, Right Eye, 2 times daily   fexofenadine (ALLEGRA) 180 mg, Oral, Every morning   fluticasone (FLONASE) 50 MCG/ACT nasal spray 2 sprays, Nasal, Daily   ipratropium (ATROVENT) 0.03 % nasal spray 2 sprays, Each Nare, Daily   latanoprost (XALATAN) 0.005 % ophthalmic solution 1 drop, Both Eyes, Daily at bedtime  levocetirizine (XYZAL) 5 mg, Oral, Every evening   levothyroxine (SYNTHROID) 88 mcg, Oral, Daily before breakfast, *BRAND NAME ONLY   metoprolol succinate (TOPROL-XL) 50 MG 24 hr tablet TAKE ONE TABLET BY MOUTH ONE TIME DAILY   montelukast (SINGULAIR) 10 mg, Oral, Daily at bedtime   Multiple Vitamins-Minerals (PRESERVISION AREDS 2 PO) 1 tablet, Oral, 2 times daily   pantoprazole (PROTONIX) 40 MG  tablet TAKE 1 TABLET BY MOUTH 2 TIMES DAILY   sodium chloride (MURO 128) 2 % ophthalmic solution 1 drop, Both Eyes, 4 times daily   sodium chloride (MURO 128) 5 % ophthalmic ointment 1 Application, Both Eyes, Daily at bedtime   sucralfate (CARAFATE) 1 g, Oral, 4 times daily   triamcinolone (NASACORT ALLERGY 24HR) 55 MCG/ACT AERO nasal inhaler 2 sprays, Nasal, Daily   triamcinolone cream (KENALOG) 0.5 % 1 Application, Topical, Daily PRN   Vitamin D (Ergocalciferol) (DRISDOL) 50,000 Units, Oral, Every Wed    Diet Orders (From admission, onward)     Start     Ordered   05/26/22 0856  Diet NPO time specified Except for: Sips with Meds  Diet effective now       Question:  Except for  Answer:  Sips with Meds   05/26/22 0856            DVT prophylaxis: heparin injection 5,000 Units Start: 05/27/22 0600 Place TED hose Start: 05/25/22 1552 SCDs Start: 05/25/22 1454   Lab Results  Component Value Date   PLT 196 05/25/2022      Code Status: Full Code  Family Communication: no family at bedside   Status is: Inpatient Remains inpatient appropriate because: surgery  Level of care: Med-Surg  Consultants:  Orthopedics  Objective: Vitals:   05/25/22 2034 05/26/22 0151 05/26/22 0600 05/26/22 0613  BP: (!) 141/67 (!) 110/96 (!) 172/87 (!) 161/85  Pulse: 93 93 (!) 110 (!) 103  Resp: 18 17 18    Temp: 98.1 F (36.7 C) 97.7 F (36.5 C) 97.8 F (36.6 C)   TempSrc: Oral Oral Oral   SpO2: 95% 96% 99%   Weight:      Height:        Intake/Output Summary (Last 24 hours) at 05/26/2022 0949 Last data filed at 05/26/2022 0947 Gross per 24 hour  Intake 1712.63 ml  Output 50 ml  Net 1662.63 ml   Wt Readings from Last 3 Encounters:  05/25/22 62.1 kg  12/04/21 64.4 kg  02/20/21 64.5 kg    Examination:  Constitutional: NAD Eyes: no scleral icterus ENMT: Mucous membranes are moist.  Neck: normal, supple Respiratory: clear to auscultation bilaterally, no wheezing, no crackles.  Normal respiratory effort. No accessory muscle use.  Cardiovascular: Regular rate and rhythm, no murmurs / rubs / gallops. No LE edema.  Abdomen: non distended, no tenderness. Bowel sounds positive.  Musculoskeletal: no clubbing / cyanosis.   Data Reviewed: I have independently reviewed following labs and imaging studies   CBC Recent Labs  Lab 05/25/22 1126  WBC 9.9  HGB 14.3  HCT 42.4  PLT 196  MCV 96.1  MCH 32.4  MCHC 33.7  RDW 11.9  LYMPHSABS 1.2  MONOABS 0.5  EOSABS 0.4  BASOSABS 0.1    Recent Labs  Lab 05/25/22 1126  NA 136  K 4.1  CL 103  CO2 25  GLUCOSE 114*  BUN 19  CREATININE 0.82  CALCIUM 9.6  INR 1.0    ------------------------------------------------------------------------------------------------------------------ No results for input(s): "CHOL", "HDL", "LDLCALC", "TRIG", "CHOLHDL", "LDLDIRECT" in the  last 72 hours.  No results found for: "HGBA1C" ------------------------------------------------------------------------------------------------------------------ No results for input(s): "TSH", "T4TOTAL", "T3FREE", "THYROIDAB" in the last 72 hours.  Invalid input(s): "FREET3"  Cardiac Enzymes No results for input(s): "CKMB", "TROPONINI", "MYOGLOBIN" in the last 168 hours.  Invalid input(s): "CK" ------------------------------------------------------------------------------------------------------------------ No results found for: "BNP"  CBG: No results for input(s): "GLUCAP" in the last 168 hours.  Recent Results (from the past 240 hour(s))  Surgical pcr screen     Status: None   Collection Time: 05/25/22 12:10 AM   Specimen: Nasal Mucosa; Nasal Swab  Result Value Ref Range Status   MRSA, PCR NEGATIVE NEGATIVE Final   Staphylococcus aureus NEGATIVE NEGATIVE Final    Comment: (NOTE) The Xpert SA Assay (FDA approved for NASAL specimens in patients 57 years of age and older), is one component of a comprehensive surveillance program. It is not  intended to diagnose infection nor to guide or monitor treatment. Performed at [facility], [address], [place], [place] [address]      Radiology Studies: CT Head Wo Contrast  Result Date: 05/25/2022 CLINICAL DATA:  Fall this morning.  Blunt head and neck trauma. EXAM: CT HEAD WITHOUT CONTRAST CT CERVICAL SPINE WITHOUT CONTRAST TECHNIQUE: Multidetector CT imaging of the head and cervical spine was performed following the standard protocol without intravenous contrast. Multiplanar CT image reconstructions of the cervical spine were also generated. RADIATION DOSE REDUCTION: This exam was performed according to the departmental dose-optimization program which includes automated exposure control, adjustment of the mA and/or kV according to patient size and/or use of iterative reconstruction technique. COMPARISON:  None Available. FINDINGS: CT HEAD FINDINGS Brain: No evidence of intracranial hemorrhage, acute infarction, hydrocephalus, extra-axial collection, or mass lesion/mass effect. Mild diffuse cerebral atrophy and chronic small vessel disease noted. Vascular:  No hyperdense vessel or other acute findings. Skull: No evidence of fracture or other significant bone abnormality. Sinuses/Orbits: Near-complete opacification of maxillary, ethmoid, sphenoid, and frontal sinuses is seen. Other: None. CT CERVICAL SPINE FINDINGS Alignment: 4 mm anterolisthesis is seen at C3-4, likely due to bilateral facet DJD seen at this level. Skull base and vertebrae: No acute fracture. No primary bone lesion or focal pathologic process. Soft tissues and spinal canal: No prevertebral fluid or swelling. No visible canal hematoma. Disc levels: Severe degenerative disc disease is seen at C4-5, C5-6, and C6-7. Moderate facet DJD is seen bilaterally at C2-3 and C3-4. Advanced atlantoaxial degenerative changes are also seen. Upper chest: No acute findings. Other: None. IMPRESSION: No acute intracranial  abnormality. Mild cerebral atrophy and chronic small vessel disease. No evidence of acute cervical spine fracture. Degenerative spondylosis. 4 mm anterolisthesis at C3-4, likely due to bilateral facet DJD at this level. Recommend clinical correlation, and consider flexion-extension views of the cervical spine to assess for ligamentous instability. Electronically Signed   By: Danae Orleans M.D.   On: 05/25/2022 14:54   CT Cervical Spine Wo Contrast  Result Date: 05/25/2022 CLINICAL DATA:  Fall this morning.  Blunt head and neck trauma. EXAM: CT HEAD WITHOUT CONTRAST CT CERVICAL SPINE WITHOUT CONTRAST TECHNIQUE: Multidetector CT imaging of the head and cervical spine was performed following the standard protocol without intravenous contrast. Multiplanar CT image reconstructions of the cervical spine were also generated. RADIATION DOSE REDUCTION: This exam was performed according to the departmental dose-optimization program which includes automated exposure control, adjustment of the mA and/or kV according to patient size and/or use of iterative reconstruction technique. COMPARISON:  None Available. FINDINGS: CT HEAD FINDINGS  Brain: No evidence of intracranial hemorrhage, acute infarction, hydrocephalus, extra-axial collection, or mass lesion/mass effect. Mild diffuse cerebral atrophy and chronic small vessel disease noted. Vascular:  No hyperdense vessel or other acute findings. Skull: No evidence of fracture or other significant bone abnormality. Sinuses/Orbits: Near-complete opacification of maxillary, ethmoid, sphenoid, and frontal sinuses is seen. Other: None. CT CERVICAL SPINE FINDINGS Alignment: 4 mm anterolisthesis is seen at C3-4, likely due to bilateral facet DJD seen at this level. Skull base and vertebrae: No acute fracture. No primary bone lesion or focal pathologic process. Soft tissues and spinal canal: No prevertebral fluid or swelling. No visible canal hematoma. Disc levels: Severe degenerative  disc disease is seen at C4-5, C5-6, and C6-7. Moderate facet DJD is seen bilaterally at C2-3 and C3-4. Advanced atlantoaxial degenerative changes are also seen. Upper chest: No acute findings. Other: None. IMPRESSION: No acute intracranial abnormality. Mild cerebral atrophy and chronic small vessel disease. No evidence of acute cervical spine fracture. Degenerative spondylosis. 4 mm anterolisthesis at C3-4, likely due to bilateral facet DJD at this level. Recommend clinical correlation, and consider flexion-extension views of the cervical spine to assess for ligamentous instability. Electronically Signed   By: Danae Orleans M.D.   On: 05/25/2022 14:54   DG Chest Port 1 View  Result Date: 05/25/2022 CLINICAL DATA:  Fall EXAM: PORTABLE CHEST 1 VIEW COMPARISON:  07/24/2004 FINDINGS: The heart size and mediastinal contours are within normal limits. Aortic atherosclerosis. Calcified granuloma in the right mid lung. Both lungs are clear. No pleural effusion or pneumothorax. The visualized skeletal structures are unremarkable. IMPRESSION: No active disease. Electronically Signed   By: Duanne Guess D.O.   On: 05/25/2022 13:33   DG Sacrum/Coccyx  Result Date: 05/25/2022 CLINICAL DATA:  Fall EXAM: SACRUM AND COCCYX - 2+ VIEW COMPARISON:  None Available. FINDINGS: There is no evidence of fracture or other focal bone lesions involving the sacrum or coccyx. Evaluation is limited in the absence of a lateral view. Acute right femoral neck fracture with impaction and superior migration. IMPRESSION: 1. Limited exam without evidence of sacrococcygeal fracture. 2. Acute right femoral neck fracture. Electronically Signed   By: Duanne Guess D.O.   On: 05/25/2022 13:31   DG Knee Complete 4 Views Right  Result Date: 05/25/2022 CLINICAL DATA:  Fall EXAM: RIGHT KNEE - COMPLETE 4+ VIEW COMPARISON:  None Available. FINDINGS: No evidence of fracture or dislocation. Small-moderate knee joint effusion. Moderate degenerative  changes of the right knee with joint space narrowing and chondrocalcinosis. Soft tissues are unremarkable. IMPRESSION: 1. No acute fracture or dislocation. 2. Small-moderate knee joint effusion, nonspecific. 3. Moderate degenerative changes of the right knee. Electronically Signed   By: Duanne Guess D.O.   On: 05/25/2022 13:29   DG Hip Unilat W or Wo Pelvis 2-3 Views Right  Result Date: 05/25/2022 CLINICAL DATA:  Fall EXAM: DG HIP (WITH OR WITHOUT PELVIS) 2-3V RIGHT COMPARISON:  None Available. FINDINGS: Acute comminuted fracture of the right femoral neck with fracture impaction and shortening. No appreciable involvement of the intertrochanteric region. No hip joint dislocation. Bony pelvis appears intact without evidence of fracture or diastasis. IMPRESSION: Acute comminuted fracture of the right femoral neck with fracture impaction and shortening. Electronically Signed   By: Duanne Guess D.O.   On: 05/25/2022 13:29     Pamella Pert, MD, PhD [facility]  Between 7 am - 7 pm I am available, please contact me via Amion (for emergencies) or Securechat (non urgent messages)  Between 7  pm - 7 am I am not available, please contact night coverage MD/APP via Amion

## 2022-05-26 NOTE — Consult Note (Signed)
ORTHOPAEDIC CONSULTATION  REQUESTING PHYSICIAN: Leatha Gilding, MD  PCP:  Cleatis Polka., MD  Chief Complaint: Right hip injury  HPI: Megan Foster is a 89 y.o. female with a past medical history of osteoporosis, chronic mesenteric ischemia with SMA stenosis treated with balloon angioplasty, esophageal dysmotility, GERD, hypertension, palpitations, hyperlipidemia, hiatal hernia, vitamin D deficiency, hypothyroidism, allergic rhinitis, and depression who presented to the emergency department at [facility] after she tripped and fell at home yesterday.  She landed on her right hip.  She had right hip pain and inability to weight-bear.  X-rays in the emergency department revealed a displaced right femoral neck fracture.  Her sister is a patient of Dr. Charlann Boxer, so she requested transfer to [facility] for treatment by [facility].  She was admitted by [facility] for perioperative risk stratification and medical optimization.  She denies precedent right hip pain prior to the injury.  The patient lives alone.  She ambulates with a cane.  She drives and does her own grocery shopping.  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  She denies other injuries.  Past Medical History:  Diagnosis Date   Allergy    environmental per pt   Arthritis    Asthma    Depression    Elevated LFTs    Esophageal dysmotility    GERD (gastroesophageal reflux disease)    Headache(784.0)    Hiatal hernia    Hyperplastic polyps of stomach    Hypertension    Hypothyroid    Irritable bowel syndrome    Macular degeneration    Nausea & vomiting 04/02/2012   Osteoporosis    Shingles    Sinusitis    Tachycardia    Vitamin D deficiency    Past Surgical History:  Procedure Laterality Date   AORTOGRAM  04/16/12   COLONOSCOPY  2008   normal   ESOPHAGOGASTRODUODENOSCOPY  2014   multiple    ESOPHAGOGASTRODUODENOSCOPY Left 01/30/2019   Procedure: ESOPHAGOGASTRODUODENOSCOPY (EGD);   Surgeon: Shellia Cleverly, DO;  Location: [facility] ENDOSCOPY;  Service: Gastroenterology;  Laterality: Left;   FOREIGN BODY REMOVAL  01/30/2019   Procedure: FOREIGN BODY REMOVAL;  Surgeon: Shellia Cleverly, DO;  Location: [facility];  Service: Gastroenterology;;   MULTIPLE TOOTH EXTRACTIONS     TOTAL ABDOMINAL HYSTERECTOMY  1999   UPPER GASTROINTESTINAL ENDOSCOPY     Social History   Socioeconomic History   Marital status: Widowed    Spouse name: Not on file   Number of children: 1   Years of education: Not on file   Highest education level: Not on file  Occupational History   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, civil (consulting)  Tobacco Use   Smoking status: Never   Smokeless tobacco: Never  Vaping Use   Vaping Use: Never used  Substance and Sexual Activity   Alcohol use: No    Alcohol/week: 0.0 standard drinks of alcohol    Comment: <1 day   Drug use: No   Sexual activity: Not on file  Other Topics Concern   Not on file  Social History Narrative   Not on file   Social Determinants of Health   Financial Resource Strain: Not on file  Food Insecurity: No Food Insecurity (05/25/2022)   Hunger Vital Sign    Worried About Running Out of Food in the Last Year: Never true    Ran Out of Food in the Last Year: Never true  Transportation  Needs: No Transportation Needs (05/25/2022)   PRAPARE - Administrator, Civil Service (Medical): No    Lack of Transportation (Non-Medical): No  Physical Activity: Not on file  Stress: Not on file  Social Connections: Not on file   Family History  Problem Relation Age of Onset   Hypertension Mother    Heart attack Mother    Heart attack Father 36   Hypertension Father    Peripheral vascular disease Father    Heart failure Sister    Cancer Sister    Hypertension Brother    Cancer Brother    Hypertension Brother    Breast cancer Sister    Ovarian cancer Other        mat cousin   Prostate cancer Maternal Uncle     Colon polyps Sister        siblings   Colon cancer Other        Maternal Aunt x3 Maternal Uncle x 2   Esophageal cancer Brother        died at 46   Kidney disease Brother    Stomach cancer Neg Hx    Rectal cancer Neg Hx    No Known Allergies Prior to Admission medications   Medication Sig Start Date End Date Taking? Authorizing Provider  acetaminophen (TYLENOL) 500 MG tablet Take 1,000 mg by mouth in the morning and at bedtime.   Yes [provider]  aspirin 81 MG tablet Take 81 mg by mouth daily.   Yes [provider]  atorvastatin (LIPITOR) 20 MG tablet Take 20 mg by mouth at bedtime.   Yes [provider]  dorzolamide-timolol (COSOPT) 2-0.5 % ophthalmic solution Place 1 drop into the right eye 2 (two) times daily.   Yes [provider]  fexofenadine (ALLEGRA) 180 MG tablet Take 180 mg by mouth every morning.   Yes [provider]  fluticasone (FLONASE) 50 MCG/ACT nasal spray Place 2 sprays into the nose daily. 12/18/10 05/25/22 Yes [place], Peter M, MD  ipratropium (ATROVENT) 0.03 % nasal spray Place 2 sprays into both nostrils daily. 03/01/22  Yes [provider]  latanoprost (XALATAN) 0.005 % ophthalmic solution Place 1 drop into both eyes at bedtime.   Yes [provider]  levocetirizine (XYZAL) 5 MG tablet Take 5 mg by mouth every evening.   Yes [provider]  levothyroxine (SYNTHROID, LEVOTHROID) 88 MCG tablet Take 88 mcg by mouth daily before breakfast. *BRAND NAME ONLY   Yes [provider]  metoprolol succinate (TOPROL-XL) 50 MG 24 hr tablet TAKE ONE TABLET BY MOUTH ONE TIME DAILY Patient taking differently: Take 50 mg by mouth daily. 09/02/12  Yes [place], Peter M, MD  montelukast (SINGULAIR) 10 MG tablet Take 10 mg by mouth at bedtime.   Yes [provider]  Multiple Vitamins-Minerals (PRESERVISION AREDS 2 PO) Take 1 tablet by mouth 2 (two) times daily.    Yes [provider]   pantoprazole (PROTONIX) 40 MG tablet TAKE 1 TABLET BY MOUTH 2 TIMES DAILY Patient taking differently: Take 40 mg by mouth 2 (two) times daily. 05/10/20  Yes Cirigliano, Vito V, DO  sodium chloride (MURO 128) 2 % ophthalmic solution Place 1 drop into both eyes in the morning, at noon, in the evening, and at bedtime.   Yes [provider]  sodium chloride (MURO 128) 5 % ophthalmic ointment Place 1 Application into both eyes at bedtime.   Yes [provider]  triamcinolone (NASACORT ALLERGY 24HR) 55  MCG/ACT AERO nasal inhaler Place 2 sprays into the nose daily.   Yes [provider]  triamcinolone cream (KENALOG) 0.5 % Apply 1 Application topically daily as needed (for irritation on hands). 01/18/19  Yes [provider]  Vitamin D, Ergocalciferol, (DRISDOL) 50000 UNITS CAPS Take 50,000 Units by mouth every Wednesday.   Yes [provider]  sucralfate (CARAFATE) 1 GM/10ML suspension Take 10 mLs (1 g total) by mouth 4 (four) times daily. Patient not taking: Reported on 03/01/2019 01/30/19 01/30/20  Shellia Cleverly, DO   CT Head Wo Contrast  Result Date: 05/25/2022 CLINICAL DATA:  Fall this morning.  Blunt head and neck trauma. EXAM: CT HEAD WITHOUT CONTRAST CT CERVICAL SPINE WITHOUT CONTRAST TECHNIQUE: Multidetector CT imaging of the head and cervical spine was performed following the standard protocol without intravenous contrast. Multiplanar CT image reconstructions of the cervical spine were also generated. RADIATION DOSE REDUCTION: This exam was performed according to the departmental dose-optimization program which includes automated exposure control, adjustment of the mA and/or kV according to patient size and/or use of iterative reconstruction technique. COMPARISON:  None Available. FINDINGS: CT HEAD FINDINGS Brain: No evidence of intracranial hemorrhage, acute infarction, hydrocephalus, extra-axial collection, or mass lesion/mass effect. Mild diffuse  cerebral atrophy and chronic small vessel disease noted. Vascular:  No hyperdense vessel or other acute findings. Skull: No evidence of fracture or other significant bone abnormality. Sinuses/Orbits: Near-complete opacification of maxillary, ethmoid, sphenoid, and frontal sinuses is seen. Other: None. CT CERVICAL SPINE FINDINGS Alignment: 4 mm anterolisthesis is seen at C3-4, likely due to bilateral facet DJD seen at this level. Skull base and vertebrae: No acute fracture. No primary bone lesion or focal pathologic process. Soft tissues and spinal canal: No prevertebral fluid or swelling. No visible canal hematoma. Disc levels: Severe degenerative disc disease is seen at C4-5, C5-6, and C6-7. Moderate facet DJD is seen bilaterally at C2-3 and C3-4. Advanced atlantoaxial degenerative changes are also seen. Upper chest: No acute findings. Other: None. IMPRESSION: No acute intracranial abnormality. Mild cerebral atrophy and chronic small vessel disease. No evidence of acute cervical spine fracture. Degenerative spondylosis. 4 mm anterolisthesis at C3-4, likely due to bilateral facet DJD at this level. Recommend clinical correlation, and consider flexion-extension views of the cervical spine to assess for ligamentous instability. Electronically Signed   By: Danae Orleans M.D.   On: 05/25/2022 14:54   CT Cervical Spine Wo Contrast  Result Date: 05/25/2022 CLINICAL DATA:  Fall this morning.  Blunt head and neck trauma. EXAM: CT HEAD WITHOUT CONTRAST CT CERVICAL SPINE WITHOUT CONTRAST TECHNIQUE: Multidetector CT imaging of the head and cervical spine was performed following the standard protocol without intravenous contrast. Multiplanar CT image reconstructions of the cervical spine were also generated. RADIATION DOSE REDUCTION: This exam was performed according to the departmental dose-optimization program which includes automated exposure control, adjustment of the mA and/or kV according to patient size and/or use of  iterative reconstruction technique. COMPARISON:  None Available. FINDINGS: CT HEAD FINDINGS Brain: No evidence of intracranial hemorrhage, acute infarction, hydrocephalus, extra-axial collection, or mass lesion/mass effect. Mild diffuse cerebral atrophy and chronic small vessel disease noted. Vascular:  No hyperdense vessel or other acute findings. Skull: No evidence of fracture or other significant bone abnormality. Sinuses/Orbits: Near-complete opacification of maxillary, ethmoid, sphenoid, and frontal sinuses is seen. Other: None. CT CERVICAL SPINE FINDINGS Alignment: 4 mm anterolisthesis is seen at C3-4, likely due to bilateral facet DJD seen at this level. Skull base and vertebrae: No  acute fracture. No primary bone lesion or focal pathologic process. Soft tissues and spinal canal: No prevertebral fluid or swelling. No visible canal hematoma. Disc levels: Severe degenerative disc disease is seen at C4-5, C5-6, and C6-7. Moderate facet DJD is seen bilaterally at C2-3 and C3-4. Advanced atlantoaxial degenerative changes are also seen. Upper chest: No acute findings. Other: None. IMPRESSION: No acute intracranial abnormality. Mild cerebral atrophy and chronic small vessel disease. No evidence of acute cervical spine fracture. Degenerative spondylosis. 4 mm anterolisthesis at C3-4, likely due to bilateral facet DJD at this level. Recommend clinical correlation, and consider flexion-extension views of the cervical spine to assess for ligamentous instability. Electronically Signed   By: Danae Orleans M.D.   On: 05/25/2022 14:54   DG Chest Port 1 View  Result Date: 05/25/2022 CLINICAL DATA:  Fall EXAM: PORTABLE CHEST 1 VIEW COMPARISON:  07/24/2004 FINDINGS: The heart size and mediastinal contours are within normal limits. Aortic atherosclerosis. Calcified granuloma in the right mid lung. Both lungs are clear. No pleural effusion or pneumothorax. The visualized skeletal structures are unremarkable. IMPRESSION: No  active disease. Electronically Signed   By: Duanne Guess D.O.   On: 05/25/2022 13:33   DG Sacrum/Coccyx  Result Date: 05/25/2022 CLINICAL DATA:  Fall EXAM: SACRUM AND COCCYX - 2+ VIEW COMPARISON:  None Available. FINDINGS: There is no evidence of fracture or other focal bone lesions involving the sacrum or coccyx. Evaluation is limited in the absence of a lateral view. Acute right femoral neck fracture with impaction and superior migration. IMPRESSION: 1. Limited exam without evidence of sacrococcygeal fracture. 2. Acute right femoral neck fracture. Electronically Signed   By: Duanne Guess D.O.   On: 05/25/2022 13:31   DG Knee Complete 4 Views Right  Result Date: 05/25/2022 CLINICAL DATA:  Fall EXAM: RIGHT KNEE - COMPLETE 4+ VIEW COMPARISON:  None Available. FINDINGS: No evidence of fracture or dislocation. Small-moderate knee joint effusion. Moderate degenerative changes of the right knee with joint space narrowing and chondrocalcinosis. Soft tissues are unremarkable. IMPRESSION: 1. No acute fracture or dislocation. 2. Small-moderate knee joint effusion, nonspecific. 3. Moderate degenerative changes of the right knee. Electronically Signed   By: Duanne Guess D.O.   On: 05/25/2022 13:29   DG Hip Unilat W or Wo Pelvis 2-3 Views Right  Result Date: 05/25/2022 CLINICAL DATA:  Fall EXAM: DG HIP (WITH OR WITHOUT PELVIS) 2-3V RIGHT COMPARISON:  None Available. FINDINGS: Acute comminuted fracture of the right femoral neck with fracture impaction and shortening. No appreciable involvement of the intertrochanteric region. No hip joint dislocation. Bony pelvis appears intact without evidence of fracture or diastasis. IMPRESSION: Acute comminuted fracture of the right femoral neck with fracture impaction and shortening. Electronically Signed   By: Duanne Guess D.O.   On: 05/25/2022 13:29    Positive ROS: All other systems have been reviewed and were otherwise negative with the exception of those  mentioned in the HPI and as above.  Physical Exam: General: Alert, no acute distress Cardiovascular: No pedal edema Respiratory: No cyanosis, no use of accessory musculature GI: No organomegaly, abdomen is soft and non-tender Skin: No lesions in the area of chief complaint Neurologic: Sensation intact distally Psychiatric: Patient is competent for consent with normal mood and affect Lymphatic: No axillary or cervical lymphadenopathy  MUSCULOSKELETAL: Examination of the right hip reveals no skin wounds or lesions.  She is shortened and externally rotated.  She has pain with attempted logrolling of the hip.  She has 2+ palpable  pedal pulses.  Positive motor function dorsiflexion, plantarflexion, great toe extension.  She reports intact sensation in the sural, saphenous, posterior tibial, superficial peroneal, and deep peroneal distributions.  Assessment: Displaced right femoral neck fracture. Osteoporosis.  Plan: I discussed the findings with the patient.  She has a displaced right femoral neck fracture which requires surgical treatment.  I recommend right hip hemiarthroplasty for pain control and immediate mobilization out of bed.  We discussed the risk, benefits, and alternatives to anterior approach right hip hemiarthroplasty.  Please see statement of risk.  Plan for surgery today.  Hold chemical DVT prophylaxis.  Continue NPO.  All questions were solicited and answered.  The risks, benefits, and alternatives were discussed with the patient. There are risks associated with the surgery including, but not limited to, problems with anesthesia (death), infection, instability (giving out of the joint), dislocation, differences in leg length/angulation/rotation, fracture of bones, loosening or failure of implants, hematoma (blood accumulation) which may require surgical drainage, blood clots, pulmonary embolism, nerve injury (foot drop and lateral thigh numbness), and blood vessel injury. The patient  understands these risks and elects to proceed.  Jonette Pesa, MD [phone]    05/26/2022 8:54 AM

## 2022-05-26 NOTE — Anesthesia Preprocedure Evaluation (Addendum)
Anesthesia Evaluation  Patient identified by MRN, date of birth, ID band Patient awake    Reviewed: Allergy & Precautions, NPO status , Patient's Chart, lab work & pertinent test results, reviewed documented beta blocker date and time   History of Anesthesia Complications Negative for: history of anesthetic complications  Airway Mallampati: II  TM Distance: >3 FB Neck ROM: Full    Dental  (+) Edentulous Upper, Edentulous Lower   Pulmonary asthma    Pulmonary exam normal        Cardiovascular hypertension, Pt. on medications and Pt. on home beta blockers + Peripheral Vascular Disease  Normal cardiovascular exam     Neuro/Psych  Headaches   Depression       GI/Hepatic Neg liver ROS, hiatal hernia,GERD  Medicated,,  Endo/Other  Hypothyroidism    Renal/GU negative Renal ROS     Musculoskeletal  (+) Arthritis ,  RIGHT FEMORAL NECK FRACTURE   Abdominal   Peds  Hematology negative hematology ROS (+)   Anesthesia Other Findings Day of surgery medications reviewed with patient.  Reproductive/Obstetrics                              Anesthesia Physical Anesthesia Plan  ASA: 2 and emergent  Anesthesia Plan: Spinal   Post-op Pain Management: Tylenol PO (pre-op)*   Induction:   PONV Risk Score and Plan: 2 and Treatment may vary due to age or medical condition, Dexamethasone, Ondansetron and Propofol infusion  Airway Management Planned: Natural Airway and Simple Face Mask  Additional Equipment: None  Intra-op Plan:   Post-operative Plan:   Informed Consent: I have reviewed the patients History and Physical, chart, labs and discussed the procedure including the risks, benefits and alternatives for the proposed anesthesia with the patient or authorized representative who has indicated his/her understanding and acceptance.       Plan Discussed with: CRNA  Anesthesia Plan Comments:          Anesthesia Quick Evaluation

## 2022-05-26 NOTE — Transfer of Care (Signed)
Immediate Anesthesia Transfer of Care Note  Patient: Megan Foster  Procedure(s) Performed: Procedure(s): ARTHROPLASTY BIPOLAR HIP (HEMIARTHROPLASTY) (Right)  Patient Location: PACU  Anesthesia Type:General  Level of Consciousness: Alert, Awake, Oriented  Airway & Oxygen Therapy: Patient Spontanous Breathing  Post-op Assessment: Report given to RN  Post vital signs: Reviewed and stable  Last Vitals:  Vitals:   05/26/22 0600 05/26/22 0613  BP: (!) 172/87 (!) 161/85  Pulse: (!) 110 (!) 103  Resp: 18   Temp: 36.6 C   SpO2: 99%     Complications: No apparent anesthesia complications

## 2022-05-26 NOTE — Progress Notes (Signed)
Patient's hands cool and trembling post-surgery. Spot checks done intermittently for HR and O2 saturations. HR in the 80's. O2 saturations in the 90's. Warm blankets applied. Haydee Salter, RN 05/26/22 5:16 PM

## 2022-05-26 NOTE — Anesthesia Procedure Notes (Signed)
Procedure Name: LMA Insertion Date/Time: 05/26/2022 11:44 AM  Performed by: Basilio Cairo, CRNAPre-anesthesia Checklist: Patient identified, Patient being monitored, Timeout performed, Emergency Drugs available and Suction available Patient Re-evaluated:Patient Re-evaluated prior to induction Oxygen Delivery Method: Circle system utilized Preoxygenation: Pre-oxygenation with 100% oxygen Induction Type: IV induction Ventilation: Mask ventilation without difficulty LMA: LMA inserted LMA Size: 4.0 Tube type: Oral Number of attempts: 1 Placement Confirmation: positive ETCO2 and breath sounds checked- equal and bilateral Tube secured with: Tape Dental Injury: Teeth and Oropharynx as per pre-operative assessment

## 2022-05-27 DIAGNOSIS — S72001A Fracture of unspecified part of neck of right femur, initial encounter for closed fracture: Secondary | ICD-10-CM | POA: Diagnosis not present

## 2022-05-27 LAB — CBC
HCT: 26.1 % — ABNORMAL LOW (ref 36.0–46.0)
HCT: 24.3 % — ABNORMAL LOW (ref 36.0–46.0)
Hemoglobin: 8.3 g/dL — ABNORMAL LOW (ref 12.0–15.0)
Hemoglobin: 7.9 g/dL — ABNORMAL LOW (ref 12.0–15.0)
MCH: 32.4 pg (ref 26.0–34.0)
MCH: 32.9 pg (ref 26.0–34.0)
MCHC: 31.8 g/dL (ref 30.0–36.0)
MCHC: 32.5 g/dL (ref 30.0–36.0)
MCV: 102 fL — ABNORMAL HIGH (ref 80.0–100.0)
MCV: 101.3 fL — ABNORMAL HIGH (ref 80.0–100.0)
Platelets: 134 10*3/uL — ABNORMAL LOW (ref 150–400)
Platelets: 147 10*3/uL — ABNORMAL LOW (ref 150–400)
RBC: 2.56 MIL/uL — ABNORMAL LOW (ref 3.87–5.11)
RBC: 2.4 MIL/uL — ABNORMAL LOW (ref 3.87–5.11)
RDW: 11.9 % (ref 11.5–15.5)
RDW: 12.2 % (ref 11.5–15.5)
WBC: 6.5 10*3/uL (ref 4.0–10.5)
WBC: 8.4 10*3/uL (ref 4.0–10.5)
nRBC: 0 % (ref 0.0–0.2)
nRBC: 0 % (ref 0.0–0.2)

## 2022-05-27 LAB — MAGNESIUM: Magnesium: 1.6 mg/dL — ABNORMAL LOW (ref 1.7–2.4)

## 2022-05-27 LAB — PREPARE RBC (CROSSMATCH)

## 2022-05-27 LAB — BPAM RBC: ISSUE DATE / TIME: 202404151954

## 2022-05-27 MED ORDER — MAGNESIUM SULFATE 2 GM/50ML IV SOLN
2.0000 g | Freq: Once | INTRAVENOUS | Status: AC
  Administered 2022-05-27: 2 g via INTRAVENOUS
  Filled 2022-05-27: qty 50

## 2022-05-27 MED ORDER — HYDROCODONE-ACETAMINOPHEN 5-325 MG PO TABS: 1.0000 | ORAL_TABLET | ORAL | 0 refills | Status: AC | PRN

## 2022-05-27 MED ORDER — ASPIRIN 81 MG PO TABS: 81.0000 mg | ORAL_TABLET | Freq: Two times a day (BID) | ORAL | 0 refills | Status: AC

## 2022-05-27 MED ORDER — SODIUM CHLORIDE 0.9% IV SOLUTION: Freq: Once | INTRAVENOUS | Status: DC

## 2022-05-27 NOTE — Discharge Instructions (Signed)
Dr. Samson Frederic Joint Replacement Specialist [facility] [address] [place], [place] [address] [phone]   TOTAL HIP REPLACEMENT POSTOPERATIVE DIRECTIONS    Hip Rehabilitation, Guidelines Following Surgery   WEIGHT BEARING Other:  Touchdown weightbearing right lower extremity with walker. No hip precautions.   The results of a hip operation are greatly improved after range of motion and muscle strengthening exercises. Follow all safety measures which are given to protect your hip. If any of these exercises cause increased pain or swelling in your joint, decrease the amount until you are comfortable again. Then slowly increase the exercises. Call your caregiver if you have problems or questions.   HOME CARE INSTRUCTIONS  Most of the following instructions are designed to prevent the dislocation of your new hip.  Remove items at home which could result in a fall. This includes throw rugs or furniture in walking pathways.  Continue medications as instructed at time of discharge. You may have some home medications which will be placed on hold until you complete the course of blood thinner medication. You may start showering once you are discharged home. Do not remove your dressing. Do not put on socks or shoes without following the instructions of your caregivers.   Sit on chairs with arms. Use the chair arms to help push yourself up when arising.  Arrange for the use of a toilet seat elevator so you are not sitting low.  Touchdown weightbearing with walker as instructed.  You may resume a sexual relationship in one month or when given the OK by your caregiver.  Use walker as long as suggested by your caregivers.  Avoid periods of inactivity such as sitting longer than an hour when not asleep. This helps prevent blood clots.  You may return to work once you are cleared by Designer, industrial/product.  Do not drive a car for 6 weeks or until released by your surgeon.   Do not drive while taking narcotics.  Wear elastic stockings for two weeks following surgery during the day but you may remove then at night.  Make sure you keep all of your appointments after your operation with all of your doctors and caregivers. You should call the office at the above phone number and make an appointment for approximately two weeks after the date of your surgery. Please pick up a stool softener and laxative for home use as long as you are requiring pain medications. ICE to the affected hip every three hours for 30 minutes at a time and then as needed for pain and swelling. Continue to use ice on the hip for pain and swelling from surgery. You may notice swelling that will progress down to the foot and ankle.  This is normal after surgery.  Elevate the leg when you are not up walking on it.   It is important for you to complete the blood thinner medication as prescribed by your doctor. Continue to use the breathing machine which will help keep your temperature down.  It is common for your temperature to cycle up and down following surgery, especially at night when you are not up moving around and exerting yourself.  The breathing machine keeps your lungs expanded and your temperature down.  RANGE OF MOTION AND STRENGTHENING EXERCISES  These exercises are designed to help you keep full movement of your hip joint. Follow your caregiver's or physical therapist's instructions. Perform all exercises about fifteen times, three times per day or as directed. Exercise both  hips, even if you have had only one joint replacement. These exercises can be done on a training (exercise) mat, on the floor, on a table or on a bed. Use whatever works the best and is most comfortable for you. Use music or television while you are exercising so that the exercises are a pleasant break in your day. This will make your life better with the exercises acting as a break in routine you can look forward to.  Lying  on your back, slowly slide your foot toward your buttocks, raising your knee up off the floor. Then slowly slide your foot back down until your leg is straight again.  Lying on your back spread your legs as far apart as you can without causing discomfort.  Lying on your side, raise your upper leg and foot straight up from the floor as far as is comfortable. Slowly lower the leg and repeat.  Lying on your back, tighten up the muscle in the front of your thigh (quadriceps muscles). You can do this by keeping your leg straight and trying to raise your heel off the floor. This helps strengthen the largest muscle supporting your knee.  Lying on your back, tighten up the muscles of your buttocks both with the legs straight and with the knee bent at a comfortable angle while keeping your heel on the floor.   SKILLED REHAB INSTRUCTIONS: If the patient is transferred to a skilled rehab facility following release from the hospital, a list of the current medications will be sent to the facility for the patient to continue.  When discharged from the skilled rehab facility, please have the facility set up the patient's Home Health Physical Therapy prior to being released. Also, the skilled facility will be responsible for providing the patient with their medications at time of release from the facility to include their pain medication and their blood thinner medication. If the patient is still at the rehab facility at time of the two week follow up appointment, the skilled rehab facility will also need to assist the patient in arranging follow up appointment in our office and any transportation needs.  POST-OPERATIVE OPIOID TAPER INSTRUCTIONS: It is important to wean off of your opioid medication as soon as possible. If you do not need pain medication after your surgery it is ok to stop day one. Opioids include: Codeine, Hydrocodone(Norco, Vicodin), Oxycodone(Percocet, oxycontin) and hydromorphone amongst others.   Long term and even short term use of opiods can cause: Increased pain response Dependence Constipation Depression Respiratory depression And more.  Withdrawal symptoms can include Flu like symptoms Nausea, vomiting And more Techniques to manage these symptoms Hydrate well Eat regular healthy meals Stay active Use relaxation techniques(deep breathing, meditating, yoga) Do Not substitute Alcohol to help with tapering If you have been on opioids for less than two weeks and do not have pain than it is ok to stop all together.  Plan to wean off of opioids This plan should start within one week post op of your joint replacement. Maintain the same interval or time between taking each dose and first decrease the dose.  Cut the total daily intake of opioids by one tablet each day Next start to increase the time between doses. The last dose that should be eliminated is the evening dose.    MAKE SURE YOU:  Understand these instructions.  Will watch your condition.  Will get help right away if you are not doing well or get worse.  Pick up  stool softner and laxative for home use following surgery while on pain medications. Do not remove your dressing. The dressing is waterproof--it is OK to take showers. Continue to use ice for pain and swelling after surgery. Do not use any lotions or creams on the incision until instructed by your surgeon. Total Hip Protocol. Touchdown weightbearing right lower extremity with walker.

## 2022-05-27 NOTE — Anesthesia Postprocedure Evaluation (Addendum)
Anesthesia Post Note  Patient: Megan Foster  Procedure(s) Performed: ARTHROPLASTY BIPOLAR HIP (HEMIARTHROPLASTY) (Right: Hip)     Patient location during evaluation: PACU Anesthesia Type: Spinal and General Level of consciousness: awake and alert Pain management: pain level controlled Vital Signs Assessment: post-procedure vital signs reviewed and stable Respiratory status: spontaneous breathing, nonlabored ventilation and respiratory function stable Cardiovascular status: blood pressure returned to baseline Postop Assessment: no apparent nausea or vomiting Anesthetic complications: no   No notable events documented.  Shanda Howells

## 2022-05-27 NOTE — TOC Initial Note (Signed)
Transition of Care [facility]) - Initial/Assessment Note   Patient Details  Name: Megan Foster MRN: [id number] Date of Birth: [birth year]  [facility]) CM/SW Contact:    Ewing Schlein, LCSW Phone Number: 05/27/2022, 3:30 PM  Clinical Narrative: PT evaluation recommended SNF. Patient and sister are agreeable to SNF. Sister requested [facility] as first choice as it is closer to her.  FL2 done; PASRR received. Initial referral faxed out. TOC awaiting bed offers.               Expected Discharge Plan: Skilled Nursing Facility Barriers to Discharge: SNF Pending bed offer, Continued Medical Work up  Patient Goals and CMS Choice Patient states their goals for this hospitalization and ongoing recovery are:: Go to rehab CMS Medicare.gov Compare Post Acute Care list provided to:: Patient Choice offered to / list presented to : Patient, Sibling  Expected Discharge Plan and Services In-house Referral: Clinical Social Work Post Acute Care Choice: Skilled Nursing Facility Living arrangements for the past 2 months: Single Family Home            DME Arranged: N/A DME Agency: NA  Prior Living Arrangements/Services Living arrangements for the past 2 months: Single Family Home Patient language and need for interpreter reviewed:: Yes Do you feel safe going back to the place where you live?: Yes      Need for Family Participation in Patient Care: Yes (Comment) Care giver support system in place?: Yes (comment) Criminal Activity/Legal Involvement Pertinent to Current Situation/Hospitalization: No - Comment as needed  Activities of Daily Living Home Assistive Devices/Equipment: Hearing aid ADL Screening (condition at time of admission) Patient's cognitive ability adequate to safely complete daily activities?: Yes Is the patient deaf or have difficulty hearing?: Yes Does the patient have difficulty seeing, even when wearing glasses/contacts?: Yes Does the patient have difficulty  concentrating, remembering, or making decisions?: No Patient able to express need for assistance with ADLs?: Yes Does the patient have difficulty dressing or bathing?: No Independently performs ADLs?: Yes (appropriate for developmental age) Does the patient have difficulty walking or climbing stairs?: Yes Weakness of Legs: Right Weakness of Arms/Hands: None  Permission Sought/Granted Permission sought to share information with : Facility Industrial/product designer granted to share information with : Yes, Verbal Permission Granted Permission granted to share info w AGENCY: SNFs  Emotional Assessment Appearance:: Appears stated age Attitude/Demeanor/Rapport: Lethargic Affect (typically observed): Accepting Orientation: : Oriented to Place, Oriented to Self, Oriented to  Time, Oriented to Situation (Patient was experiencing some intermittent confusion.) Alcohol / Substance Use: Not Applicable Psych Involvement: No (comment)  Admission diagnosis:  Closed fracture of neck of right femur, initial encounter [S72.001A] Fall, initial encounter [W19.XXXA] Closed displaced fracture of right femoral neck [S72.001A] Patient Active Problem List   Diagnosis Date Noted   Closed displaced fracture of right femoral neck 05/25/2022   Fall at home 05/25/2022   Food impaction of esophagus    Esophageal stricture    Right groin pain 05/28/2013   Pelvic pain in female 05/28/2013   SMA stenosis 11/17/2012   Chronic mesenteric ischemia 05/12/2012   Superior mesenteric artery stenosis 04/13/2012   Palpitations 12/18/2010   HTN (hypertension) 12/18/2010   THYROID DISORDER 04/21/2007   Hyperlipidemia 04/21/2007   ASTHMATIC BRONCHITIS, ACUTE 04/21/2007   GERD 04/21/2007   IBS 04/21/2007   ALLERGY 04/21/2007   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 -  [place], [place] - [address] [place] [address] Phone: [phone] Fax: [phone]  [facility] [address], [place] - [address] [place] [address] [place] [address] Phone: [phone] Fax: [phone]  [id number] - [place], [place] - [address] [place] [address] Phone: [phone] Fax: [phone]  Social Determinants of Health (SDOH) Social History: SDOH Screenings   Food Insecurity: No Food Insecurity (05/25/2022)  Housing: Low Risk  (05/25/2022)  Transportation Needs: No Transportation Needs (05/25/2022)  Utilities: Not At Risk (05/25/2022)  Tobacco Use: Low Risk  (05/25/2022)   SDOH Interventions:    Readmission Risk Interventions     No data to display

## 2022-05-27 NOTE — Evaluation (Signed)
Occupational Therapy Evaluation Patient Details Name: Megan Foster MRN: [id number] DOB: Mar 02, 1927 Today's Date: 05/27/2022   History of Present Illness Patient presented to the hospital on 4/13 after a fall at home resulting in pain in R hip and inability to bear weight. Patient was found to have displaced right femoral neck fracture. Patient underwent right hip hemiarthroplasty with anterior approach on 4/14. QPR:[id number], depression, asthma, macular degeneration,   Clinical Impression    Patient is a 89 year old female who was admitted for above. Patient was living at home independently prior level with PRN support from family. Currently, patient is TD for bed mobility and unable to maintain WB restrictions. Patient was found with RLE in internal rotation,and knee flexion in bed at start of session. Nurse made aware. Patients sister was present during session. Patient was noted to have decreased functional activity tolernace, decreased ROM, decreased BUE strength, decreased endurance, decreased sitting balance, decreased standing balanced, decreased safety awareness, and decreased knowledge of AE/AD impacting participation in ADLs. Patient would continue to benefit from skilled OT services at this time while admitted and after d/c to address noted deficits in order to improve overall safety and independence in ADLs.       Recommendations for follow up therapy are one component of a multi-disciplinary discharge planning process, led by the attending physician.  Recommendations may be updated based on patient status, additional functional criteria and insurance authorization.   Assistance Recommended at Discharge Frequent or constant Supervision/Assistance  Patient can return home with the following Two people to help with walking and/or transfers;Two people to help with bathing/dressing/bathroom;Direct supervision/assist for financial management;Help with stairs or ramp for entrance;Assist  for transportation;Direct supervision/assist for medications management;Assistance with cooking/housework    Functional Status Assessment  Patient has had a recent decline in their functional status and demonstrates the ability to make significant improvements in function in a reasonable and predictable amount of time.  Equipment Recommendations  None recommended by OT       Precautions / Restrictions Precautions Precautions: Fall Precaution Comments: monitor O2, on 1L/min during session Restrictions Weight Bearing Restrictions: Yes RLE Weight Bearing: Touchdown weight bearing      Mobility Bed Mobility Overal bed mobility: Needs Assistance Bed Mobility: Supine to Sit, Sit to Supine     Supine to sit: +2 for safety/equipment, +2 for physical assistance, Max assist Sit to supine: +2 for physical assistance, +2 for safety/equipment, Total assist   General bed mobility comments: patient needed physical A to advance BLE and trunk to midline EOB.             Balance Overall balance assessment: Needs assistance Sitting-balance support: Feet supported, Single extremity supported, Bilateral upper extremity supported Sitting balance-Leahy Scale: Fair Sitting balance - Comments: able to maintain midline with incr time in sitting, initial heavy L lateral lean to off load R hip Postural control: Left lateral lean       ADL either performed or assessed with clinical judgement   ADL Overall ADL's : Needs assistance/impaired Eating/Feeding: Moderate assistance;Sitting   Grooming: Total assistance;Bed level Grooming Details (indicate cue type and reason): patient noted to have alot of knots and near matting in some areas more than would be aquired from 2 days in bed for surgery ater fall. sister reported patient puts hair in hat most days. patient reported she bruses her hair daily. Upper Body Bathing: Bed level;Maximal assistance   Lower Body Bathing: Bed level;Total assistance    Upper Body Dressing : Bed  level;Maximal assistance   Lower Body Dressing: Bed level;Total assistance   Toilet Transfer: +2 for physical assistance;+2 for safety/equipment Toilet Transfer Details (indicate cue type and reason): attempted standing with patient unable to maintain WB restrictions and returned to bed at this time Toileting- Clothing Manipulation and Hygiene: +2 for safety/equipment;Bed level;+2 for physical assistance       Functional mobility during ADLs: +2 for safety/equipment;+2 for physical assistance       Vision Baseline Vision/History: 3 Glaucoma Additional Comments: has glaucoma in R eye with patient's sister reporting needing to keep up with special eye treatment with mirror daily. patient endorsed having hard time seeing anything out of right eye.            Pertinent Vitals/Pain Pain Assessment Pain Assessment: Faces Faces Pain Scale: Hurts whole lot Pain Location: with movement of RLE Pain Descriptors / Indicators: Grimacing, Moaning, Guarding, Constant Pain Intervention(s): Limited activity within patient's tolerance, Repositioned, Premedicated before session, Monitored during session     Hand Dominance Right   Extremity/Trunk Assessment Upper Extremity Assessment Upper Extremity Assessment: Defer to OT evaluation   Lower Extremity Assessment Lower Extremity Assessment: RLE deficits/detail RLE Deficits / Details: ankle grossly WFL- pt found positioned in end range knee flexion and hip internal rotation on  OT arrival--RN and PA were notified; able to reposition to neutral and pt at ~90/90 EOB without incr pain RLE: Unable to fully assess due to pain       Communication Communication Communication: HOH   Cognition Arousal/Alertness: Lethargic Behavior During Therapy: Flat affect Overall Cognitive Status: Difficult to assess           General Comments: patient was oriented to self and sister in room. patient was lost in timeline of fall  to today for majority of session with multiple repeats of orientation during session.                Home Living Family/patient expects to be discharged to:: Skilled nursing facility Living Arrangements: Alone Available Help at Discharge: Family;Available PRN/intermittently Type of Home: House Home Access: Ramped entrance     Home Layout: Two level;Able to live on main level with bedroom/bathroom     Bathroom Shower/Tub: Tub/shower unit         Home Equipment: Cane - single point   Additional Comments: walking stick      Prior Functioning/Environment Prior Level of Function : Independent/Modified Independent                        OT Problem List: Decreased coordination;Decreased safety awareness;Decreased knowledge of precautions;Impaired balance (sitting and/or standing);Pain;Decreased knowledge of use of DME or AE;Decreased activity tolerance      OT Treatment/Interventions: Self-care/ADL training;Energy conservation;Therapeutic exercise;DME and/or AE instruction;Therapeutic activities;Patient/family education;Balance training    OT Goals(Current goals can be found in the care plan section) Acute Rehab OT Goals Patient Stated Goal: none stated OT Goal Formulation: With family Time For Goal Achievement: 06/10/22 Potential to Achieve Goals: Fair  OT Frequency: Min 2X/week    Co-evaluation PT/OT/SLP Co-Evaluation/Treatment: Yes Reason for Co-Treatment: For patient/therapist safety;To address functional/ADL transfers PT goals addressed during session: Mobility/safety with mobility OT goals addressed during session: ADL's and self-care      AM-PAC OT "6 Clicks" Daily Activity     Outcome Measure Help from another person eating meals?: A Little Help from another person taking care of personal grooming?: A Little Help from another person toileting, which includes using toliet, bedpan, or  urinal?: A Lot Help from another person bathing (including washing,  rinsing, drying)?: A Lot Help from another person to put on and taking off regular upper body clothing?: A Lot Help from another person to put on and taking off regular lower body clothing?: A Lot 6 Click Score: 14   End of Session Equipment Utilized During Treatment: Gait belt;Rolling walker (2 wheels) Nurse Communication: Weight bearing status;Mobility status;Other (comment) (concerns over positioning of RLE in bed)  Activity Tolerance: Patient limited by pain;Patient limited by fatigue;Patient limited by lethargy Patient left: in bed;with call bell/phone within reach;with bed alarm set;with family/visitor present  OT Visit Diagnosis: Unsteadiness on feet (R26.81);Other abnormalities of gait and mobility (R26.89);Muscle weakness (generalized) (M62.81)                Time: 6045-4098 OT Time Calculation (min): 41 min Charges:  OT General Charges $OT Visit: 1 Visit OT Evaluation $OT Eval Moderate Complexity: 1 Mod OT Treatments $Therapeutic Activity: 8-22 mins  Rosalio Loud, MS Acute Rehabilitation Department Office# [phone]   Selinda Flavin 05/27/2022, 12:11 PM

## 2022-05-27 NOTE — Progress Notes (Signed)
PROGRESS NOTE  Megan Foster YQM:[id number] DOB: 10-17-1927 DOA: 05/25/2022 PCP: Cleatis Polka., MD   LOS: 2 days   Brief Narrative / Interim history: 89 year old female, retired Glass blower/designer, with osteoporosis, chronic mesenteric ischemia with SMA stenosis status post balloon angioplasty, esophageal dysmotility, GERD, HTN, HLD, hiatal hernia, vitamin D deficiency who comes into the hospital after a fall and subsequent hip fracture.  She initially presented to [facility], and was transferred to [facility] for orthopedic surgery consultation.  Subjective / 24h Interval events: She is doing well this morning, awaiting surgery.  Denies any chest pain, denies any shortness of breath  Assesement and Plan: Principal Problem:   Closed displaced fracture of right femoral neck Active Problems:   Hyperlipidemia   ESOPHAGEAL MOTILITY DISORDER   GERD   Palpitations   HTN (hypertension)   Superior mesenteric artery stenosis   Chronic mesenteric ischemia   Fall at home  Principal problem Right femoral neck fracture -orthopedic surgery consulted, patient was taken to the OR on 4/14 and is status post right hip hemiarthroplasty.  DVT prophylaxis appears to be with aspirin twice daily.  PT recommends SNF and patient is agreeable.  Social work following  Active problems Acute blood loss anemia, postoperative -hemoglobin dropped from 13 to 8.3 overnight.  Recheck again this afternoon, she is pale appearing.  If it continues to drop will need a blood transfusion, she is agreeable.  There is also dilutional component as all lines are down.  Stop fluids  Thrombocytopenia -platelets down, likely consumptive, also dilutional.  No bleeding  Essential hypertension -continue home metoprolol  Hypothyroidism -continue home Synthroid  Osteoporosis, Vitamin D deficiency -vitamin D levels are actually quite high, will likely need to pause her vitamin D for a month or so and have it  rechecked as an outpatient  Hyperlipidemia -continue atorvastatin  Chronic allergic rhinitis -continue home medications  Scheduled Meds:  aspirin  81 mg Oral BID   atorvastatin  20 mg Oral QHS   docusate sodium  100 mg Oral BID   timolol  1 drop Right Eye BID   And   dorzolamide  1 drop Right Eye BID   fluticasone  2 spray Each Nare Daily   ipratropium  2 spray Each Nare Daily   latanoprost  1 drop Both Eyes QHS   levothyroxine  88 mcg Oral QAC breakfast   loratadine  10 mg Oral q morning   metoprolol succinate  50 mg Oral Daily   montelukast  10 mg Oral QHS   pantoprazole  40 mg Oral BID   senna  1 tablet Oral BID   sodium chloride  1 drop Both Eyes TID AC & HS   [START ON 05/29/2022] Vitamin D (Ergocalciferol)  50,000 Units Oral Q Wed   Continuous Infusions:  sodium chloride 150 mL/hr at 05/26/22 1711   magnesium sulfate bolus IVPB     methocarbamol (ROBAXIN) IV     PRN Meds:.acetaminophen, alum & mag hydroxide-simeth, bisacodyl, diphenhydrAMINE, HYDROcodone-acetaminophen, HYDROcodone-acetaminophen, menthol-cetylpyridinium **OR** phenol, methocarbamol **OR** methocarbamol (ROBAXIN) IV, metoCLOPramide **OR** metoCLOPramide (REGLAN) injection, morphine injection, ondansetron **OR** ondansetron (ZOFRAN) IV, polyethylene glycol, zolpidem  Current Outpatient Medications  Medication Instructions   acetaminophen (TYLENOL) 1,000 mg, Oral, 2 times daily   aspirin 81 mg, Oral, Daily   atorvastatin (LIPITOR) 20 mg, Oral, Daily at bedtime   dorzolamide-timolol (COSOPT) 2-0.5 % ophthalmic solution 1 drop, Right Eye, 2 times daily   fexofenadine (ALLEGRA) 180 mg, Oral, Every  morning   fluticasone (FLONASE) 50 MCG/ACT nasal spray 2 sprays, Nasal, Daily   HYDROcodone-acetaminophen (NORCO/VICODIN) 5-325 MG tablet 1 tablet, Oral, Every 4 hours PRN   ipratropium (ATROVENT) 0.03 % nasal spray 2 sprays, Each Nare, Daily   latanoprost (XALATAN) 0.005 % ophthalmic solution 1 drop, Both Eyes,  Daily at bedtime   levocetirizine (XYZAL) 5 mg, Oral, Every evening   levothyroxine (SYNTHROID) 88 mcg, Oral, Daily before breakfast, *BRAND NAME ONLY   metoprolol succinate (TOPROL-XL) 50 MG 24 hr tablet TAKE ONE TABLET BY MOUTH ONE TIME DAILY   montelukast (SINGULAIR) 10 mg, Oral, Daily at bedtime   Multiple Vitamins-Minerals (PRESERVISION AREDS 2 PO) 1 tablet, Oral, 2 times daily   pantoprazole (PROTONIX) 40 MG tablet TAKE 1 TABLET BY MOUTH 2 TIMES DAILY   sodium chloride (MURO 128) 2 % ophthalmic solution 1 drop, Both Eyes, 4 times daily   sodium chloride (MURO 128) 5 % ophthalmic ointment 1 Application, Both Eyes, Daily at bedtime   sucralfate (CARAFATE) 1 g, Oral, 4 times daily   triamcinolone (NASACORT ALLERGY 24HR) 55 MCG/ACT AERO nasal inhaler 2 sprays, Nasal, Daily   triamcinolone cream (KENALOG) 0.5 % 1 Application, Topical, Daily PRN   Vitamin D (Ergocalciferol) (DRISDOL) 50,000 Units, Oral, Every Wed    Diet Orders (From admission, onward)     Start     Ordered   05/26/22 1615  Diet regular Room service appropriate? Yes; Fluid consistency: Thin  Diet effective now       Question Answer Comment  Room service appropriate? Yes   Fluid consistency: Thin      05/26/22 1614            DVT prophylaxis: SCDs Start: 05/26/22 1615 Place TED hose Start: 05/25/22 1552 SCDs Start: 05/25/22 1454   Lab Results  Component Value Date   PLT 134 (L) 05/27/2022      Code Status: Full Code  Family Communication: no family at bedside   Status is: Inpatient Remains inpatient appropriate because: needs SNF  Level of care: Med-Surg  Consultants:  Orthopedics  Objective: Vitals:   05/26/22 2206 05/27/22 0220 05/27/22 0615 05/27/22 0943  BP: (!) 132/56 128/64 (!) 144/65 (!) 127/48  Pulse: 95 84 100 97  Resp: Temp: 98 F (36.7 C) 98.4 F (36.9 C) 98.2 F (36.8 C) 99.2 F (37.3 C)  TempSrc: Oral Oral Oral Axillary  SpO2: 93% 92% 98% 93%  Weight:       Height:        Intake/Output Summary (Last 24 hours) at 05/27/2022 1036 Last data filed at 05/27/2022 0610 Gross per 24 hour  Intake 4474.48 ml  Output 1900 ml  Net 2574.48 ml    Wt Readings from Last 3 Encounters:  05/25/22 62.1 kg  12/04/21 64.4 kg  02/20/21 64.5 kg    Examination:  Constitutional: NAD Eyes: lids and conjunctivae normal, no scleral icterus ENMT: mmm Neck: normal, supple Respiratory: clear to auscultation bilaterally, no wheezing, no crackles. Normal respiratory effort.  Cardiovascular: Regular rate and rhythm, no murmurs / rubs / gallops. No LE edema. Abdomen: soft, no distention, no tenderness. Bowel sounds positive.  Skin: no rashes Neurologic: no focal deficits, equal strength  Data Reviewed: I have independently reviewed following labs and imaging studies   CBC Recent Labs  Lab 05/25/22 1126 05/26/22 0334 05/27/22 0325  WBC 9.9 8.4 6.5  HGB 14.3 13.4 8.3*  HCT 42.4 40.4 26.1*  PLT 196 195 134*  MCV  96.1 97.1 102.0*  MCH 32.4 32.2 32.4  MCHC 33.7 33.2 31.8  RDW 11.9 11.9 11.9  LYMPHSABS 1.2  --   --   MONOABS 0.5  --   --   EOSABS 0.4  --   --   BASOSABS 0.1  --   --      Recent Labs  Lab 05/25/22 1126 05/26/22 0334 05/27/22 0325  NA 136 135  --   K 4.1 3.7  --   CL 103 103  --   CO2 25 24  --   GLUCOSE 114* 137*  --   BUN 19 16  --   CREATININE 0.82 0.78  --   CALCIUM 9.6 8.6*  --   MG  --   --  1.6*  INR 1.0  --   --      ------------------------------------------------------------------------------------------------------------------ No results for input(s): "CHOL", "HDL", "LDLCALC", "TRIG", "CHOLHDL", "LDLDIRECT" in the last 72 hours.  No results found for: "HGBA1C" ------------------------------------------------------------------------------------------------------------------ No results for input(s): "TSH", "T4TOTAL", "T3FREE", "THYROIDAB" in the last 72 hours.  Invalid input(s): "FREET3"  Cardiac Enzymes No  results for input(s): "CKMB", "TROPONINI", "MYOGLOBIN" in the last 168 hours.  Invalid input(s): "CK" ------------------------------------------------------------------------------------------------------------------ No results found for: "BNP"  CBG: No results for input(s): "GLUCAP" in the last 168 hours.  Recent Results (from the past 240 hour(s))  Surgical pcr screen     Status: None   Collection Time: 05/25/22 12:10 AM   Specimen: Nasal Mucosa; Nasal Swab  Result Value Ref Range Status   MRSA, PCR NEGATIVE NEGATIVE Final   Staphylococcus aureus NEGATIVE NEGATIVE Final    Comment: (NOTE) The Xpert SA Assay (FDA approved for NASAL specimens in patients 60 years of age and older), is one component of a comprehensive surveillance program. It is not intended to diagnose infection nor to guide or monitor treatment. Performed at [facility], [address] [place], [place] [address]      Radiology Studies: DG Pelvis Portable  Result Date: 05/26/2022 CLINICAL DATA:  Postop. EXAM: PORTABLE PELVIS 1-2 VIEWS COMPARISON:  Preoperative right hip exam. FINDINGS: Right hip arthroplasty in expected alignment. Cerclage wire fixation throughout the femur. Recent postsurgical change includes air and edema in the soft tissues. Lateral skin staples in place. IMPRESSION: Right hip arthroplasty with cerclage wire fixation. No immediate postoperative complication. Electronically Signed   By: Narda Rutherford M.D.   On: 05/26/2022 15:19   DG HIP UNILAT WITH PELVIS 1V RIGHT  Result Date: 05/26/2022 CLINICAL DATA:  Elective surgery. EXAM: DG HIP (WITH OR WITHOUT PELVIS) 1V RIGHT COMPARISON:  Preoperative imaging. FINDINGS: Three fluoroscopic spot views of the pelvis and right hip obtained in the operating room. Right hip arthroplasty with cerclage wire fixation. Fluoroscopy time 38 seconds. Dose 3.34 mGy. IMPRESSION: Intraoperative fluoroscopy during right hip arthroplasty.  Electronically Signed   By: Narda Rutherford M.D.   On: 05/26/2022 14:36   DG C-Arm 1-60 Min-No Report  Result Date: 05/26/2022 Fluoroscopy was utilized by the requesting physician.  No radiographic interpretation.   DG C-Arm 1-60 Min-No Report  Result Date: 05/26/2022 Fluoroscopy was utilized by the requesting physician.  No radiographic interpretation.   DG C-Arm 1-60 Min-No Report  Result Date: 05/26/2022 Fluoroscopy was utilized by the requesting physician.  No radiographic interpretation.   DG C-Arm 1-60 Min-No Report  Result Date: 05/26/2022 Fluoroscopy was utilized by the requesting physician.  No radiographic interpretation.     Pamella Pert, MD, PhD [facility]  Between 7  am - 7 pm I am available, please contact me via Amion (for emergencies) or Securechat (non urgent messages)  Between 7 pm - 7 am I am not available, please contact night coverage MD/APP via Amion

## 2022-05-27 NOTE — Progress Notes (Signed)
NT bladder scanned pt at 1500 and bladder scan value was 225. RN, NT and pts niece encouraged liquids. Pt still had no OP at 1900, bladder scan showed 289. Pts abdomen was soft and not distended at both times of bladder scan.

## 2022-05-27 NOTE — Progress Notes (Signed)
Night RN is aware of pts voiding status and requested this RN not to page provider at this time due. Night RN gave pt more fluid and reports she will page if pt does not urinate soon.

## 2022-05-27 NOTE — Evaluation (Signed)
Physical Therapy Evaluation Patient Details Name: Megan Foster MRN: [id number] DOB: Apr 18, 1927 Today's Date: 05/27/2022  History of Present Illness  Patient presented to the hospital on 4/13 after a fall at home resulting in pain in R hip and inability to bear weight. Patient was found to have displaced right femoral neck fracture. Patient underwent right hip hemiarthroplasty with anterior approach on 4/14. SWH:[id number], depression, asthma, macular degeneration,  Clinical Impression  Pt admitted with above diagnosis.  Pt with confusion and difficulty following commands this am; attempted standing and pt unable to maintain TDWB with +2 assist, therefore transfer to chair deferred at this time. Pt will need post acute rehab, continue PT in acute setting  Pt currently with functional limitations due to the deficits listed below (see PT Problem List). Pt will benefit from acute skilled PT to increase their independence and safety with mobility to allow discharge.          Recommendations for follow up therapy are one component of a multi-disciplinary discharge planning process, led by the attending physician.  Recommendations may be updated based on patient status, additional functional criteria and insurance authorization.  Follow Up Recommendations Can patient physically be transported by private vehicle: No     Assistance Recommended at Discharge Frequent or constant Supervision/Assistance  Patient can return home with the following  Two people to help with bathing/dressing/bathroom;Two people to help with walking and/or transfers;Assistance with cooking/housework;Assist for transportation;Help with stairs or ramp for entrance    Equipment Recommendations  (defer to SNF)  Recommendations for Other Services       Functional Status Assessment Patient has had a recent decline in their functional status and demonstrates the ability to make significant improvements in function in a  reasonable and predictable amount of time.     Precautions / Restrictions Precautions Precautions: Fall Precaution Comments: monitor O2, on 1L/min during session Restrictions Weight Bearing Restrictions: Yes RLE Weight Bearing: Touchdown weight bearing      Mobility  Bed Mobility Overal bed mobility: Needs Assistance Bed Mobility: Supine to Sit, Sit to Supine     Supine to sit: +2 for safety/equipment, +2 for physical assistance, Max assist (EOB with OT) Sit to supine: +2 for physical assistance, +2 for safety/equipment, Total assist   General bed mobility comments: patient needed physical A to advance BLE and trunk to midline EOB. total assist to return to supine lowering trunk and lifting LEs on to bed    Transfers Overall transfer level: Needs assistance Equipment used: Rolling walker (2 wheels) Transfers: Sit to/from Stand Sit to Stand: Max assist, Total assist, +2 physical assistance, +2 safety/equipment           General transfer comment: 2 attempts to coem to full stand; +2 assist for anterior-superior wt shift, pt unable to maintain TDWB so returned to sitting position; pt requring multi-modal cues and with difficulty following commands d/t confusion    Ambulation/Gait                  Stairs            Wheelchair Mobility    Modified Rankin (Stroke Patients Only)       Balance Overall balance assessment: Needs assistance Sitting-balance support: Feet supported, Single extremity supported, Bilateral upper extremity supported Sitting balance-Leahy Scale: Fair Sitting balance - Comments: able to maintain midline with incr time in sitting, initial heavy L lateral lean to off load R hip Postural control: Left lateral lean  Pertinent Vitals/Pain Pain Assessment Pain Assessment: Faces Faces Pain Scale: Hurts whole lot Pain Location: with movement of RLE Pain Descriptors / Indicators: Grimacing,  Moaning, Guarding, Constant Pain Intervention(s): Limited activity within patient's tolerance, Monitored during session, Premedicated before session, Repositioned    Home Living Family/patient expects to be discharged to:: Skilled nursing facility Living Arrangements: Alone Available Help at Discharge: Family;Available PRN/intermittently Type of Home: House Home Access: Ramped entrance       Home Layout: Two level;Able to live on main level with bedroom/bathroom Home Equipment: Cane - single point Additional Comments: walking stick    Prior Function Prior Level of Function : Independent/Modified Independent                     Hand Dominance   Dominant Hand: Right    Extremity/Trunk Assessment   Upper Extremity Assessment Upper Extremity Assessment: Defer to OT evaluation    Lower Extremity Assessment Lower Extremity Assessment: RLE deficits/detail RLE Deficits / Details: ankle grossly WFL- pt found positioned in end range knee flexion and hip internal rotation on  OT arrival--RN and PA were notified; able to reposition to neutral and pt at ~90/90 EOB without incr pain RLE: Unable to fully assess due to pain       Communication   Communication: HOH  Cognition Arousal/Alertness: Lethargic Behavior During Therapy: Flat affect Overall Cognitive Status: Difficult to assess                                 General Comments: patient was oriented to self and sister in room. patient was lost in timeline of fall to today for majority of session with multiple repeats of orientation during session.        General Comments      Exercises     Assessment/Plan    PT Assessment Patient needs continued PT services  PT Problem List Decreased strength;Decreased activity tolerance;Decreased balance;Decreased mobility;Decreased knowledge of precautions;Pain;Decreased knowledge of use of DME;Decreased cognition       PT Treatment Interventions DME  instruction;Gait training;Functional mobility training;Therapeutic activities;Patient/family education;Therapeutic exercise;Balance training    PT Goals (Current goals can be found in the Care Plan section)  Acute Rehab PT Goals Patient Stated Goal: to go to rehab-sister PT Goal Formulation: With patient/family Time For Goal Achievement: 06/10/22 Potential to Achieve Goals: Good    Frequency Min 1X/week     Co-evaluation               AM-PAC PT "6 Clicks" Mobility  Outcome Measure Help needed turning from your back to your side while in a flat bed without using bedrails?: Total Help needed moving from lying on your back to sitting on the side of a flat bed without using bedrails?: Total Help needed moving to and from a bed to a chair (including a wheelchair)?: Total Help needed standing up from a chair using your arms (e.g., wheelchair or bedside chair)?: Total Help needed to walk in hospital room?: Total Help needed climbing 3-5 steps with a railing? : Total 6 Click Score: 6    End of Session Equipment Utilized During Treatment: Gait belt Activity Tolerance: Patient limited by fatigue;Other (comment);Patient limited by pain (confusion) Patient left: in bed;with call bell/phone within reach;with bed alarm set;with family/visitor present Nurse Communication: Mobility status PT Visit Diagnosis: Other abnormalities of gait and mobility (R26.89);Difficulty in walking, not elsewhere classified (R26.2)    Time: 9147-8295  PT Time Calculation (min) (ACUTE ONLY): 21 min   Charges:   PT Evaluation $PT Eval Low Complexity: 1 Low          Sharlene Mccluskey, PT  Acute Rehab Dept [facility]) [phone]  05/27/2022   [facility] 05/27/2022, 9:31 AM

## 2022-05-27 NOTE — [place] FL2 (Signed)
[place] MEDICAID FL2 LEVEL OF CARE FORM     IDENTIFICATION  Patient Name: Megan Foster Birthdate: 1927/11/14 Sex: female Admission Date (Current Location): 05/25/2022  [facility] and [place] Number:  Producer, television/film/video and Address:  [facility],  [address] [place], [place] [address]      Provider Number: [id number]  Attending Physician Name and Address:  Leatha Gilding, MD  Relative Name and Phone Number:  Solon Palm (sister) Ph: [phone]    Current Level of Care: Hospital Recommended Level of Care: Skilled Nursing Facility Prior Approval Number:    Date Approved/Denied:   PASRR Number: [id number]  Discharge Plan: SNF    Current Diagnoses: Patient Active Problem List   Diagnosis Date Noted   Closed displaced fracture of right femoral neck 05/25/2022   Fall at home 05/25/2022   Food impaction of esophagus    Esophageal stricture    Right groin pain 05/28/2013   Pelvic pain in female 05/28/2013   SMA stenosis 11/17/2012   Chronic mesenteric ischemia 05/12/2012   Superior mesenteric artery stenosis 04/13/2012   Palpitations 12/18/2010   HTN (hypertension) 12/18/2010   THYROID DISORDER 04/21/2007   Hyperlipidemia 04/21/2007   ASTHMATIC BRONCHITIS, ACUTE 04/21/2007   GERD 04/21/2007   IBS 04/21/2007   ALLERGY 04/21/2007   SCHATZKI'S RING 09/26/2006   ESOPHAGEAL MOTILITY DISORDER 09/26/2006   Hiatal hernia 09/26/2006   GASTRIC POLYP 03/04/2006    Orientation RESPIRATION BLADDER Height & Weight     Self, Time, Situation, Place  O2 (1L/min) Continent Weight: 137 lb (62.1 kg) Height:   (165.1 cm)  BEHAVIORAL SYMPTOMS/MOOD NEUROLOGICAL BOWEL NUTRITION STATUS   (N/A)  (N/A) Continent Diet (Regular diet)  AMBULATORY STATUS COMMUNICATION OF NEEDS Skin   Extensive Assist Verbally Surgical wounds                       Personal Care Assistance Level of Assistance  Bathing, Feeding, Dressing Bathing Assistance: Maximum  assistance Feeding assistance: Limited assistance Dressing Assistance: Maximum assistance     Functional Limitations Info  Sight, Hearing, Speech Sight Info: Impaired Hearing Info: Impaired Speech Info: Adequate    SPECIAL CARE FACTORS FREQUENCY  PT (By licensed PT), OT (By licensed OT)     PT Frequency: 5x's/week OT Frequency: 5x's/week            Contractures Contractures Info: Not present    Additional Factors Info  Code Status, Allergies Code Status Info: Full Allergies Info: NKA           Current Medications (05/27/2022):  This is the current hospital active medication list Current Facility-Administered Medications  Medication Dose Route Frequency Provider Last Rate Last Admin   acetaminophen (TYLENOL) tablet 325-650 mg  325-650 mg Oral Q6H PRN Samson Frederic, MD   650 mg at 05/27/22 1205   alum & mag hydroxide-simeth (MAALOX/MYLANTA) 200-200-20 MG/5ML suspension 30 mL  30 mL Oral Q4H PRN Swinteck, Arlys John, MD       aspirin chewable tablet 81 mg  81 mg Oral BID Samson Frederic, MD   81 mg at 05/27/22 1034   atorvastatin (LIPITOR) tablet 20 mg  20 mg Oral QHS Swinteck, Arlys John, MD   20 mg at 05/26/22 2200   bisacodyl (DULCOLAX) EC tablet 5 mg  5 mg Oral Daily PRN Samson Frederic, MD       diphenhydrAMINE (BENADRYL) 12.5 MG/5ML elixir 12.5-25 mg  12.5-25 mg Oral Q4H PRN Samson Frederic, MD  docusate sodium (COLACE) capsule 100 mg  100 mg Oral BID Samson Frederic, MD   100 mg at 05/27/22 1035   timolol (TIMOPTIC) 0.5 % ophthalmic solution 1 drop  1 drop Right Eye BID Samson Frederic, MD   1 drop at 05/27/22 1036   And   dorzolamide (TRUSOPT) 2 % ophthalmic solution 1 drop  1 drop Right Eye BID Samson Frederic, MD   1 drop at 05/27/22 1053   fluticasone (FLONASE) 50 MCG/ACT nasal spray 2 spray  2 spray Each Nare Daily Swinteck, Arlys John, MD       HYDROcodone-acetaminophen (NORCO) 7.5-325 MG per tablet 1-2 tablet  1-2 tablet Oral Q4H PRN Swinteck, Arlys John, MD        HYDROcodone-acetaminophen (NORCO/VICODIN) 5-325 MG per tablet 1-2 tablet  1-2 tablet Oral Q4H PRN Samson Frederic, MD   2 tablet at 05/27/22 0152   ipratropium (ATROVENT) 0.03 % nasal spray 2 spray  2 spray Each Nare Daily Swinteck, Arlys John, MD       latanoprost (XALATAN) 0.005 % ophthalmic solution 1 drop  1 drop Both Eyes QHS Swinteck, Arlys John, MD   1 drop at 05/26/22 2204   levothyroxine (SYNTHROID) tablet 88 mcg  88 mcg Oral QAC breakfast Samson Frederic, MD   88 mcg at 05/27/22 1610   loratadine (CLARITIN) tablet 10 mg  10 mg Oral q morning Swinteck, Arlys John, MD   10 mg at 05/27/22 1035   menthol-cetylpyridinium (CEPACOL) lozenge 3 mg  1 lozenge Oral PRN Swinteck, Arlys John, MD       Or   phenol (CHLORASEPTIC) mouth spray 1 spray  1 spray Mouth/Throat PRN Swinteck, Arlys John, MD       methocarbamol (ROBAXIN) tablet 500 mg  500 mg Oral Q6H PRN Samson Frederic, MD   500 mg at 05/27/22 0153   Or   methocarbamol (ROBAXIN) 500 mg in dextrose 5 % 50 mL IVPB  500 mg Intravenous Q6H PRN Swinteck, Arlys John, MD       metoCLOPramide (REGLAN) tablet 5-10 mg  5-10 mg Oral Q8H PRN Swinteck, Arlys John, MD       Or   metoCLOPramide (REGLAN) injection 5-10 mg  5-10 mg Intravenous Q8H PRN Swinteck, Arlys John, MD       metoprolol succinate (TOPROL-XL) 24 hr tablet 50 mg  50 mg Oral Daily Swinteck, Arlys John, MD   50 mg at 05/27/22 1126   montelukast (SINGULAIR) tablet 10 mg  10 mg Oral QHS Swinteck, Arlys John, MD   10 mg at 05/26/22 2200   morphine (PF) 2 MG/ML injection 0.5-1 mg  0.5-1 mg Intravenous Q2H PRN Samson Frederic, MD   1 mg at 05/27/22 0607   ondansetron (ZOFRAN) tablet 4 mg  4 mg Oral Q6H PRN Swinteck, Arlys John, MD       Or   ondansetron (ZOFRAN) injection 4 mg  4 mg Intravenous Q6H PRN Samson Frederic, MD   4 mg at 05/26/22 1949   pantoprazole (PROTONIX) EC tablet 40 mg  40 mg Oral BID Samson Frederic, MD   40 mg at 05/27/22 1035   polyethylene glycol (MIRALAX / GLYCOLAX) packet 17 g  17 g Oral Daily PRN Swinteck, Arlys John, MD        senna (SENOKOT) tablet 8.6 mg  1 tablet Oral BID Samson Frederic, MD   8.6 mg at 05/27/22 1035   sodium chloride (MURO 128) 2 % ophthalmic solution 1 drop  1 drop Both Eyes TID AC & HS Samson Frederic, MD   1 drop at 05/27/22 1206   [  START ON 05/29/2022] Vitamin D (Ergocalciferol) (DRISDOL) 1.25 MG (50000 UNIT) capsule 50,000 Units  50,000 Units Oral Q Wed Swinteck, Arlys John, MD       zolpidem (AMBIEN) tablet 5 mg  5 mg Oral QHS PRN Samson Frederic, MD   5 mg at 05/26/22 2202     Discharge Medications: Please see discharge summary for a list of discharge medications.  Relevant Imaging Results:  Relevant Lab Results:   Additional Information SSN: [id number]  Ewing Schlein, LCSW

## 2022-05-27 NOTE — Progress Notes (Signed)
    Subjective:  Patient reports pain as mild to moderate.  Denies N/V/CP/SOB/Abd pain. She denies any tingling or numbness in LE bilaterally. She endorses left hip and thigh pain today. She states she did not get much rest last night.   Objective:   VITALS:   Vitals:   05/27/22 0615 05/27/22 0943 05/27/22 1037 05/27/22 1307  BP: (!) 144/65 (!) 127/48 (!) 123/52 (!) 125/52  Pulse: 100 97 100 87  Resp: 16 17  17   Temp: 98.2 F (36.8 C) 99.2 F (37.3 C)  98.6 F (37 C)  TempSrc: Oral Axillary  Oral  SpO2: 98% 93%  (!) 73%  Weight:      Height:        Patient lying in bed. NAD.  Neurologically intact ABD soft Neurovascular intact Sensation intact distally Intact pulses distally Dorsiflexion/Plantar flexion intact Incision: dressing C/D/I No cellulitis present Compartment soft Dressings C/D/I x2.  Bruising noted in right thigh.   Lab Results  Component Value Date   WBC 6.5 05/27/2022   HGB 8.3 (L) 05/27/2022   HCT 26.1 (L) 05/27/2022   MCV 102.0 (H) 05/27/2022   PLT 134 (L) 05/27/2022   BMET    Component Value Date/Time   NA 135 05/26/2022 0334   K 3.7 05/26/2022 0334   CL 103 05/26/2022 0334   CO2 24 05/26/2022 0334   GLUCOSE 137 (H) 05/26/2022 0334   BUN 16 05/26/2022 0334   CREATININE 0.78 05/26/2022 0334   CREATININE 1.03 11/13/2012 1305   CALCIUM 8.6 (L) 05/26/2022 0334   GFRNONAA >60 05/26/2022 0334     Assessment/Plan: 1 Day Post-Op   Principal Problem:   Closed displaced fracture of right femoral neck Active Problems:   Hyperlipidemia   ESOPHAGEAL MOTILITY DISORDER   GERD   Palpitations   HTN (hypertension)   Superior mesenteric artery stenosis   Chronic mesenteric ischemia   Fall at home   Touchdown weightbearing RLE with walker. No hip precautions.  DVT ppx: Aspirin, SCDs, TEDS PO pain control PT/OT: PT has not seen yet. PT to come by today.  Dispo:  - Patient under care of the medical team. Disposition per their recommendation.   - Likely d/c to SNF due to weightbearing status. Pain medication printed in chart. Recommend aspirin 81mg  BID for 6 weeks for DVT ppx.    Clois Dupes, PA-C 05/27/2022, 1:35 PM   [facility]  [place] [address], [address], [place], [place] [address] Phone: [phone] [email or web address] Facebook  [employer]

## 2022-05-28 DIAGNOSIS — S72001A Fracture of unspecified part of neck of right femur, initial encounter for closed fracture: Secondary | ICD-10-CM | POA: Diagnosis not present

## 2022-05-28 LAB — CBC
HCT: 26.8 % — ABNORMAL LOW (ref 36.0–46.0)
Hemoglobin: 8.9 g/dL — ABNORMAL LOW (ref 12.0–15.0)
MCH: 31.3 pg (ref 26.0–34.0)
MCHC: 33.2 g/dL (ref 30.0–36.0)
MCV: 94.4 fL (ref 80.0–100.0)
Platelets: 120 K/uL — ABNORMAL LOW (ref 150–400)
RBC: 2.84 MIL/uL — ABNORMAL LOW (ref 3.87–5.11)
RDW: 13.7 % (ref 11.5–15.5)
WBC: 7.2 K/uL (ref 4.0–10.5)
nRBC: 0 % (ref 0.0–0.2)

## 2022-05-28 LAB — BPAM RBC
Blood Product Expiration Date: 202405182359
Unit Type and Rh: 5100

## 2022-05-28 LAB — TYPE AND SCREEN
ABO/RH(D): O POS
Antibody Screen: NEGATIVE
Unit division: 0

## 2022-05-28 LAB — COMPREHENSIVE METABOLIC PANEL WITH GFR
ALT: 18 U/L (ref 0–44)
AST: 71 U/L — ABNORMAL HIGH (ref 15–41)
Albumin: 2.7 g/dL — ABNORMAL LOW (ref 3.5–5.0)
Alkaline Phosphatase: 46 U/L (ref 38–126)
Anion gap: 7 (ref 5–15)
BUN: 12 mg/dL (ref 8–23)
CO2: 21 mmol/L — ABNORMAL LOW (ref 22–32)
Calcium: 7.7 mg/dL — ABNORMAL LOW (ref 8.9–10.3)
Chloride: 105 mmol/L (ref 98–111)
Creatinine, Ser: 0.71 mg/dL (ref 0.44–1.00)
GFR, Estimated: >60 mL/min
Glucose, Bld: 120 mg/dL — ABNORMAL HIGH (ref 70–99)
Potassium: 3.1 mmol/L — ABNORMAL LOW (ref 3.5–5.1)
Sodium: 133 mmol/L — ABNORMAL LOW (ref 135–145)
Total Bilirubin: 1 mg/dL (ref 0.3–1.2)
Total Protein: 5.1 g/dL — ABNORMAL LOW (ref 6.5–8.1)

## 2022-05-28 LAB — MAGNESIUM: Magnesium: 1.9 mg/dL (ref 1.7–2.4)

## 2022-05-28 MED ORDER — POTASSIUM CHLORIDE CRYS ER 20 MEQ PO TBCR
30.0000 meq | EXTENDED_RELEASE_TABLET | ORAL | Status: AC
  Administered 2022-05-28 (×2): 30 meq via ORAL
  Filled 2022-05-28 (×2): qty 1

## 2022-05-28 NOTE — TOC Progression Note (Signed)
Transition of Care [facility]) - Progression Note   Patient Details  Name: Palak Tercero MRN: [id number] Date of Birth: 11-17-1927  Transition of [facility]) CM/SW Contact  Ewing Schlein, LCSW Phone Number: 05/28/2022, 3:29 PM  Clinical Narrative: CSW spoke with Meryle Ready in admissions at [facility] to have the referral reviewed. Meryle Ready able to accept the patient tomorrow in a semiprivate room, but patient will be alone in the room. CSW updated sister and she is agreeable to the bed offer. TOC to follow.  Expected Discharge Plan: Skilled Nursing Facility Barriers to Discharge: SNF Pending bed offer, Continued Medical Work up  Expected Discharge Plan and Services In-house Referral: Clinical Social Work Post Acute Care Choice: Skilled Nursing Facility Living arrangements for the past 2 months: Single Family Home           DME Arranged: N/A DME Agency: NA  Social Determinants of Health (SDOH) Interventions SDOH Screenings   Food Insecurity: No Food Insecurity (05/25/2022)  Housing: Low Risk  (05/25/2022)  Transportation Needs: No Transportation Needs (05/25/2022)  Utilities: Not At Risk (05/25/2022)  Tobacco Use: Low Risk  (05/25/2022)   Readmission Risk Interventions     No data to display

## 2022-05-28 NOTE — Progress Notes (Signed)
    Subjective:  Patient reports pain as mild to moderate.  Denies N/V/CP/SOB/Abd pain. She denies any tingling or numbness in LE bilaterally. She reports thigh pain but states it is slightly better than yesterday.   Objective:   VITALS:   Vitals:   05/27/22 2020 05/27/22 2317 05/28/22 0350 05/28/22 0744  BP: (!) 137/58 114/76 (!) 148/74 (!) 141/65  Pulse: 90 95  88  Resp: Temp: 98.3 F (36.8 C) 98 F (36.7 C) 98.3 F (36.8 C) 98.5 F (36.9 C)  TempSrc: Oral Oral Oral   SpO2: 96% 98% 95% 97%  Weight:      Height:        Patient lying in bed sleeping. Woken up for exam. NAD.  Neurologically intact ABD soft Neurovascular intact Sensation intact distally Intact pulses distally Dorsiflexion/Plantar flexion intact No cellulitis present Compartment soft Incisions: Aquacel dressings C/D/I x2.  Bruising on lateral aspect of her thigh.   Lab Results  Component Value Date   WBC 7.2 05/28/2022   HGB 8.9 (L) 05/28/2022   HCT 26.8 (L) 05/28/2022   MCV 94.4 05/28/2022   PLT 120 (L) 05/28/2022   BMET    Component Value Date/Time   NA 133 (L) 05/28/2022 0340   K 3.1 (L) 05/28/2022 0340   CL 105 05/28/2022 0340   CO2 21 (L) 05/28/2022 0340   GLUCOSE 120 (H) 05/28/2022 0340   BUN 12 05/28/2022 0340   CREATININE 0.71 05/28/2022 0340   CREATININE 1.03 11/13/2012 1305   CALCIUM 7.7 (L) 05/28/2022 0340   GFRNONAA >60 05/28/2022 0340     Assessment/Plan: 2 Days Post-Op   Principal Problem:   Closed displaced fracture of right femoral neck Active Problems:   Hyperlipidemia   ESOPHAGEAL MOTILITY DISORDER   GERD   Palpitations   HTN (hypertension)   Superior mesenteric artery stenosis   Chronic mesenteric ischemia   Fall at home   Touchdown weightbearing on RLE with walker. No hip precautions.  DVT ppx: Aspirin, SCDs, TEDS PO pain control PT/OT: Slow progression with PT. PT to continue today.  Dispo:  - Patient under care of the medical team.  Disposition per their recommendation.  - Likely d/c to SNF due to weightbearing status. Pain medication printed in chart. Recommend aspirin  BID for 6 weeks for DVT ppx.   Clois Dupes, PA-C 05/28/2022, 8:51 AM   [facility]  [place] [address], [place], [place] [address] Phone: [phone] [email or web address] Facebook  [employer]

## 2022-05-28 NOTE — Progress Notes (Signed)
Physical Therapy Treatment Patient Details Name: Megan Foster MRN: [id number] DOB: [birth year] Today's Date: 05/28/2022   History of Present Illness Patient presented to the hospital on 4/13 after a fall at home resulting in pain in R hip and inability to bear weight. Patient was found to have displaced right femoral neck fracture. Patient underwent right hip hemiarthroplasty with anterior approach on 4/14. WUJ:[id number], depression, asthma, macular degeneration,    PT Comments    Pt progressing toward goals, able to stand and  transfer to chair with 2 person assist, improving ability to maintain TDWB this session. Pt  with much less confusion and able to follow commands today D/c plan remains appropriate.   Recommendations for follow up therapy are one component of a multi-disciplinary discharge planning process, led by the attending physician.  Recommendations may be updated based on patient status, additional functional criteria and insurance authorization.  Follow Up Recommendations  Can patient physically be transported by private vehicle: No    Assistance Recommended at Discharge Frequent or constant Supervision/Assistance  Patient can return home with the following Two people to help with bathing/dressing/bathroom;Two people to help with walking and/or transfers;Assistance with cooking/housework;Assist for transportation;Help with stairs or ramp for entrance   Equipment Recommendations  None recommended by PT    Recommendations for Other Services       Precautions / Restrictions Precautions Precautions: Fall Precaution Comments: monitor O2; on RA this session with sats 100% after transition to chair Restrictions RLE Weight Bearing: Touchdown weight bearing     Mobility  Bed Mobility Overal bed mobility: Needs Assistance Bed Mobility: Supine to Sit     Supine to sit: Mod assist, Max assist     General bed mobility comments: pt able to scoot laterally with cues  for technique. assist to elevate trunk and progress RLE off bed    Transfers Overall transfer level: Needs assistance Equipment used: Rolling walker (2 wheels) Transfers: Sit to/from Stand, Bed to chair/wheelchair/BSC Sit to Stand: Mod assist, Max assist Stand pivot transfers: Max assist, Total assist, +2 physical assistance, +2 safety/equipment         General transfer comment: assist to rise and transition to RW, cues for hand placement,  RLE position and  TDWB    Ambulation/Gait                   Stairs             Wheelchair Mobility    Modified Rankin (Stroke Patients Only)       Balance Overall balance assessment: Needs assistance Sitting-balance support: Feet supported, Single extremity supported, Bilateral upper extremity supported Sitting balance-Leahy Scale: Fair     Standing balance support: Reliant on assistive device for balance, Bilateral upper extremity supported Standing balance-Leahy Scale: Zero Standing balance comment: reliant on UEs and external assist                            Cognition Arousal/Alertness: Awake/alert Behavior During Therapy: WFL for tasks assessed/performed Overall Cognitive Status: Within Functional Limits for tasks assessed                                          Exercises      General Comments        Pertinent Vitals/Pain Pain Assessment Pain Assessment: Faces Faces Pain Scale: Hurts little  more Pain Location: with movement of RLE Pain Descriptors / Indicators: Discomfort, Grimacing Pain Intervention(s): Limited activity within patient's tolerance, Monitored during session, Premedicated before session, Repositioned    Home Living                          Prior Function            PT Goals (current goals can now be found in the care plan section) Acute Rehab PT Goals Patient Stated Goal: to go to rehab-sister PT Goal Formulation: With  patient/family Time For Goal Achievement: 06/10/22 Potential to Achieve Goals: Good Progress towards PT goals: Progressing toward goals    Frequency    Min 1X/week      PT Plan Current plan remains appropriate    Co-evaluation              AM-PAC PT "6 Clicks" Mobility   Outcome Measure  Help needed turning from your back to your side while in a flat bed without using bedrails?: Total Help needed moving from lying on your back to sitting on the side of a flat bed without using bedrails?: Total Help needed moving to and from a bed to a chair (including a wheelchair)?: Total Help needed standing up from a chair using your arms (e.g., wheelchair or bedside chair)?: Total Help needed to walk in hospital room?: Total Help needed climbing 3-5 steps with a railing? : Total 6 Click Score: 6    End of Session Equipment Utilized During Treatment: Gait belt Activity Tolerance: Patient tolerated treatment well Patient left: in chair;with call bell/phone within reach;with chair alarm set;with family/visitor present   PT Visit Diagnosis: Other abnormalities of gait and mobility (R26.89);Difficulty in walking, not elsewhere classified (R26.2)     Time: 3016-0109 PT Time Calculation (min) (ACUTE ONLY): 23 min  Charges:  $Therapeutic Activity: 23-37 mins                     Shai Rasmussen, PT  Acute Rehab Dept [facility]) [phone]  05/28/2022    [facility] 05/28/2022, 1:32 PM

## 2022-05-28 NOTE — Progress Notes (Signed)
PROGRESS NOTE  Megan Foster WUJ:[id number] DOB: [birth year] DOA: 05/25/2022 PCP: Cleatis Polka., MD   LOS: 3 days   Brief Narrative / Interim history: 89 year old female, retired Glass blower/designer, with osteoporosis, chronic mesenteric ischemia with SMA stenosis status post balloon angioplasty, esophageal dysmotility, GERD, HTN, HLD, hiatal hernia, vitamin D deficiency who comes into the hospital after a fall and subsequent hip fracture.  She initially presented to [facility], and was transferred to [facility] for orthopedic surgery consultation.  Subjective / 24h Interval events: Doing better.  Eating breakfast.  Complains of pain but manageable  Assesement and Plan: Principal Problem:   Closed displaced fracture of right femoral neck Active Problems:   Hyperlipidemia   ESOPHAGEAL MOTILITY DISORDER   GERD   Palpitations   HTN (hypertension)   Superior mesenteric artery stenosis   Chronic mesenteric ischemia   Fall at home  Principal problem Right femoral neck fracture -orthopedic surgery consulted, patient was taken to the OR on 4/14 and is status post right hip hemiarthroplasty.  DVT prophylaxis appears to be with aspirin twice daily.  PT recommends SNF and patient is agreeable.  Social worker follow-up, should be ready for discharge tomorrow  Active problems Acute blood loss anemia, postoperative -hemoglobin dropped from 13 prior to surgery to 7.9 yesterday, received a unit of packed red blood cells, improved appropriately this morning at 8.9.  Repeat in the morning  Hypokalemia -replace potassium and recheck in the morning  Thrombocytopenia -platelets down, likely consumptive, also dilutional.  No bleeding.  Overall stable  Essential hypertension -continue home metoprolol  Hypothyroidism -continue home Synthroid  Osteoporosis, Vitamin D deficiency -vitamin D levels are actually quite high, will likely need to pause her vitamin D for a month or so and  have it rechecked as an outpatient  Hyperlipidemia -continue atorvastatin  Chronic allergic rhinitis -continue home medications  Scheduled Meds:  sodium chloride   Intravenous Once   aspirin  81 mg Oral BID   atorvastatin  20 mg Oral QHS   docusate sodium  100 mg Oral BID   timolol  1 drop Right Eye BID   And   dorzolamide  1 drop Right Eye BID   fluticasone  2 spray Each Nare Daily   ipratropium  2 spray Each Nare Daily   latanoprost  1 drop Both Eyes QHS   levothyroxine  88 mcg Oral QAC breakfast   loratadine  10 mg Oral q morning   metoprolol succinate  50 mg Oral Daily   montelukast  10 mg Oral QHS   pantoprazole  40 mg Oral BID   potassium chloride  30 mEq Oral Q3H   senna  1 tablet Oral BID   sodium chloride  1 drop Both Eyes TID AC & HS   [START ON 05/29/2022] Vitamin D (Ergocalciferol)  50,000 Units Oral Q Wed   Continuous Infusions:  methocarbamol (ROBAXIN) IV     PRN Meds:.acetaminophen, alum & mag hydroxide-simeth, bisacodyl, diphenhydrAMINE, HYDROcodone-acetaminophen, HYDROcodone-acetaminophen, menthol-cetylpyridinium **OR** phenol, methocarbamol **OR** methocarbamol (ROBAXIN) IV, metoCLOPramide **OR** metoCLOPramide (REGLAN) injection, morphine injection, ondansetron **OR** ondansetron (ZOFRAN) IV, polyethylene glycol, zolpidem  Current Outpatient Medications  Medication Instructions   acetaminophen (TYLENOL) 1,000 mg, Oral, 2 times daily   aspirin 81 mg, Oral, 2 times daily with meals   atorvastatin (LIPITOR) 20 mg, Oral, Daily at bedtime   dorzolamide-timolol (COSOPT) 2-0.5 % ophthalmic solution 1 drop, Right Eye, 2 times daily   fexofenadine (ALLEGRA) 180 mg, Oral, Every  morning   fluticasone (FLONASE) 50 MCG/ACT nasal spray 2 sprays, Nasal, Daily   HYDROcodone-acetaminophen (NORCO/VICODIN) 5-325 MG tablet 1 tablet, Oral, Every 4 hours PRN   ipratropium (ATROVENT) 0.03 % nasal spray 2 sprays, Each Nare, Daily   latanoprost (XALATAN) 0.005 % ophthalmic solution  1 drop, Both Eyes, Daily at bedtime   levocetirizine (XYZAL) 5 mg, Oral, Every evening   levothyroxine (SYNTHROID) 88 mcg, Oral, Daily before breakfast, *BRAND NAME ONLY   metoprolol succinate (TOPROL-XL) 50 MG 24 hr tablet TAKE ONE TABLET BY MOUTH ONE TIME DAILY   montelukast (SINGULAIR) 10 mg, Oral, Daily at bedtime   Multiple Vitamins-Minerals (PRESERVISION AREDS 2 PO) 1 tablet, Oral, 2 times daily   pantoprazole (PROTONIX) 40 MG tablet TAKE 1 TABLET BY MOUTH 2 TIMES DAILY   sodium chloride (MURO 128) 2 % ophthalmic solution 1 drop, Both Eyes, 4 times daily   sodium chloride (MURO 128) 5 % ophthalmic ointment 1 Application, Both Eyes, Daily at bedtime   sucralfate (CARAFATE) 1 g, Oral, 4 times daily   triamcinolone (NASACORT ALLERGY 24HR) 55 MCG/ACT AERO nasal inhaler 2 sprays, Nasal, Daily   triamcinolone cream (KENALOG) 0.5 % 1 Application, Topical, Daily PRN   Vitamin D (Ergocalciferol) (DRISDOL) 50,000 Units, Oral, Every Wed    Diet Orders (From admission, onward)     Start     Ordered   05/26/22 1615  Diet regular Room service appropriate? Yes; Fluid consistency: Thin  Diet effective now       Question Answer Comment  Room service appropriate? Yes   Fluid consistency: Thin      05/26/22 1614            DVT prophylaxis: SCDs Start: 05/26/22 1615 Place TED hose Start: 05/25/22 1552 SCDs Start: 05/25/22 1454   Lab Results  Component Value Date   PLT 120 (L) 05/28/2022      Code Status: Full Code  Family Communication: no family at bedside   Status is: Inpatient Remains inpatient appropriate because: needs SNF  Level of care: Med-Surg  Consultants:  Orthopedics  Objective: Vitals:   05/27/22 2020 05/27/22 2317 05/28/22 0350 05/28/22 0744  BP: (!) 137/58 114/76 (!) 148/74 (!) 141/65  Pulse: 90 95  88  Resp: Temp: 98.3 F (36.8 C) 98 F (36.7 C) 98.3 F (36.8 C) 98.5 F (36.9 C)  TempSrc: Oral Oral Oral   SpO2: 96% 98% 95% 97%  Weight:       Height:        Intake/Output Summary (Last 24 hours) at 05/28/2022 1136 Last data filed at 05/28/2022 0900 Gross per 24 hour  Intake 630 ml  Output 500 ml  Net 130 ml    Wt Readings from Last 3 Encounters:  05/25/22 62.1 kg  12/04/21 64.4 kg  02/20/21 64.5 kg    Examination:  Constitutional: NAD Eyes: lids and conjunctivae normal, no scleral icterus ENMT: mmm Neck: normal, supple Respiratory: clear to auscultation bilaterally, no wheezing, no crackles.  Cardiovascular: Regular rate and rhythm, no murmurs / rubs / gallops.  Abdomen: soft, no distention, no tenderness. Bowel sounds positive.  Skin: no rashes Neurologic: no focal deficits, equal strength  Data Reviewed: I have independently reviewed following labs and imaging studies   CBC Recent Labs  Lab 05/25/22 1126 05/26/22 0334 05/27/22 0325 05/27/22 1557 05/28/22 0340  WBC 9.9 8.4 6.5 8.4 7.2  HGB 14.3 13.4 8.3* 7.9* 8.9*  HCT 42.4 40.4 26.1* 24.3* 26.8*  PLT  196 195 134* 147* 120*  MCV 96.1 97.1 102.0* 101.3* 94.4  MCH 32.4 32.2 32.4 32.9 31.3  MCHC 33.7 33.2 31.8 32.5 33.2  RDW 11.9 11.9 11.9 12.2 13.7  LYMPHSABS 1.2  --   --   --   --   MONOABS 0.5  --   --   --   --   EOSABS 0.4  --   --   --   --   BASOSABS 0.1  --   --   --   --      Recent Labs  Lab 05/25/22 1126 05/26/22 0334 05/27/22 0325 05/28/22 0340  NA 136 135  --  133*  K 4.1 3.7  --  3.1*  CL 103 103  --  105  CO2 25 24  --  21*  GLUCOSE 114* 137*  --  120*  BUN 19 16  --  12  CREATININE 0.82 0.78  --  0.71  CALCIUM 9.6 8.6*  --  7.7*  AST  --   --   --  71*  ALT  --   --   --  18  ALKPHOS  --   --   --  46  BILITOT  --   --   --  1.0  ALBUMIN  --   --   --  2.7*  MG  --   --  1.6* 1.9  INR 1.0  --   --   --      ------------------------------------------------------------------------------------------------------------------ No results for input(s): "CHOL", "HDL", "LDLCALC", "TRIG", "CHOLHDL", "LDLDIRECT" in the  last 72 hours.  No results found for: "HGBA1C" ------------------------------------------------------------------------------------------------------------------ No results for input(s): "TSH", "T4TOTAL", "T3FREE", "THYROIDAB" in the last 72 hours.  Invalid input(s): "FREET3"  Cardiac Enzymes No results for input(s): "CKMB", "TROPONINI", "MYOGLOBIN" in the last 168 hours.  Invalid input(s): "CK" ------------------------------------------------------------------------------------------------------------------ No results found for: "BNP"  CBG: No results for input(s): "GLUCAP" in the last 168 hours.  Recent Results (from the past 240 hour(s))  Surgical pcr screen     Status: None   Collection Time: 05/25/22 12:10 AM   Specimen: Nasal Mucosa; Nasal Swab  Result Value Ref Range Status   MRSA, PCR NEGATIVE NEGATIVE Final   Staphylococcus aureus NEGATIVE NEGATIVE Final    Comment: (NOTE) The Xpert SA Assay (FDA approved for NASAL specimens in patients 33 years of age and older), is one component of a comprehensive surveillance program. It is not intended to diagnose infection nor to guide or monitor treatment. Performed at [facility], [address] [place], [place] [address]      Radiology Studies: No results found.   Pamella Pert, MD, PhD [facility]  Between 7 am - 7 pm I am available, please contact me via Amion (for emergencies) or Securechat (non urgent messages)  Between 7 pm - 7 am I am not available, please contact night coverage MD/APP via Amion

## 2022-05-28 NOTE — Progress Notes (Signed)
In and out catheter at 23:30 yielded 500 mL of amber / clear urine.

## 2022-05-29 ENCOUNTER — Encounter (HOSPITAL_COMMUNITY): Payer: Self-pay | Admitting: Orthopedic Surgery

## 2022-05-29 DIAGNOSIS — R339 Retention of urine, unspecified: Secondary | ICD-10-CM | POA: Diagnosis not present

## 2022-05-29 DIAGNOSIS — R531 Weakness: Secondary | ICD-10-CM | POA: Diagnosis not present

## 2022-05-29 DIAGNOSIS — I959 Hypotension, unspecified: Secondary | ICD-10-CM | POA: Diagnosis not present

## 2022-05-29 DIAGNOSIS — M25551 Pain in right hip: Secondary | ICD-10-CM | POA: Diagnosis not present

## 2022-05-29 DIAGNOSIS — R062 Wheezing: Secondary | ICD-10-CM | POA: Diagnosis not present

## 2022-05-29 DIAGNOSIS — K219 Gastro-esophageal reflux disease without esophagitis: Secondary | ICD-10-CM | POA: Diagnosis not present

## 2022-05-29 DIAGNOSIS — M25561 Pain in right knee: Secondary | ICD-10-CM | POA: Diagnosis not present

## 2022-05-29 DIAGNOSIS — R2681 Unsteadiness on feet: Secondary | ICD-10-CM | POA: Diagnosis not present

## 2022-05-29 DIAGNOSIS — N390 Urinary tract infection, site not specified: Secondary | ICD-10-CM | POA: Diagnosis not present

## 2022-05-29 DIAGNOSIS — W1830XA Fall on same level, unspecified, initial encounter: Secondary | ICD-10-CM | POA: Diagnosis not present

## 2022-05-29 DIAGNOSIS — R338 Other retention of urine: Secondary | ICD-10-CM | POA: Diagnosis not present

## 2022-05-29 DIAGNOSIS — R4182 Altered mental status, unspecified: Secondary | ICD-10-CM | POA: Diagnosis not present

## 2022-05-29 DIAGNOSIS — R1310 Dysphagia, unspecified: Secondary | ICD-10-CM | POA: Diagnosis not present

## 2022-05-29 DIAGNOSIS — E860 Dehydration: Secondary | ICD-10-CM | POA: Diagnosis not present

## 2022-05-29 DIAGNOSIS — E559 Vitamin D deficiency, unspecified: Secondary | ICD-10-CM | POA: Diagnosis not present

## 2022-05-29 DIAGNOSIS — R404 Transient alteration of awareness: Secondary | ICD-10-CM | POA: Diagnosis not present

## 2022-05-29 DIAGNOSIS — R41841 Cognitive communication deficit: Secondary | ICD-10-CM | POA: Diagnosis not present

## 2022-05-29 DIAGNOSIS — J309 Allergic rhinitis, unspecified: Secondary | ICD-10-CM | POA: Diagnosis not present

## 2022-05-29 DIAGNOSIS — S80919A Unspecified superficial injury of unspecified knee, initial encounter: Secondary | ICD-10-CM | POA: Diagnosis not present

## 2022-05-29 DIAGNOSIS — R7401 Elevation of levels of liver transaminase levels: Secondary | ICD-10-CM | POA: Diagnosis not present

## 2022-05-29 DIAGNOSIS — S72001D Fracture of unspecified part of neck of right femur, subsequent encounter for closed fracture with routine healing: Secondary | ICD-10-CM | POA: Diagnosis not present

## 2022-05-29 DIAGNOSIS — Z7982 Long term (current) use of aspirin: Secondary | ICD-10-CM | POA: Diagnosis not present

## 2022-05-29 DIAGNOSIS — Z9181 History of falling: Secondary | ICD-10-CM | POA: Diagnosis not present

## 2022-05-29 DIAGNOSIS — S72001A Fracture of unspecified part of neck of right femur, initial encounter for closed fracture: Secondary | ICD-10-CM | POA: Diagnosis not present

## 2022-05-29 DIAGNOSIS — M25562 Pain in left knee: Secondary | ICD-10-CM | POA: Diagnosis not present

## 2022-05-29 DIAGNOSIS — H547 Unspecified visual loss: Secondary | ICD-10-CM | POA: Diagnosis not present

## 2022-05-29 DIAGNOSIS — H40012 Open angle with borderline findings, low risk, left eye: Secondary | ICD-10-CM | POA: Diagnosis not present

## 2022-05-29 DIAGNOSIS — S8991XA Unspecified injury of right lower leg, initial encounter: Secondary | ICD-10-CM | POA: Diagnosis not present

## 2022-05-29 DIAGNOSIS — K224 Dyskinesia of esophagus: Secondary | ICD-10-CM | POA: Diagnosis not present

## 2022-05-29 DIAGNOSIS — Z7401 Bed confinement status: Secondary | ICD-10-CM | POA: Diagnosis not present

## 2022-05-29 DIAGNOSIS — F0390 Unspecified dementia without behavioral disturbance: Secondary | ICD-10-CM | POA: Diagnosis not present

## 2022-05-29 DIAGNOSIS — R3989 Other symptoms and signs involving the genitourinary system: Secondary | ICD-10-CM | POA: Diagnosis not present

## 2022-05-29 DIAGNOSIS — E785 Hyperlipidemia, unspecified: Secondary | ICD-10-CM | POA: Diagnosis not present

## 2022-05-29 DIAGNOSIS — H353122 Nonexudative age-related macular degeneration, left eye, intermediate dry stage: Secondary | ICD-10-CM | POA: Diagnosis not present

## 2022-05-29 DIAGNOSIS — I10 Essential (primary) hypertension: Secondary | ICD-10-CM | POA: Diagnosis not present

## 2022-05-29 DIAGNOSIS — K5900 Constipation, unspecified: Secondary | ICD-10-CM | POA: Diagnosis not present

## 2022-05-29 DIAGNOSIS — M6281 Muscle weakness (generalized): Secondary | ICD-10-CM | POA: Diagnosis not present

## 2022-05-29 DIAGNOSIS — W19XXXA Unspecified fall, initial encounter: Secondary | ICD-10-CM | POA: Diagnosis not present

## 2022-05-29 DIAGNOSIS — R262 Difficulty in walking, not elsewhere classified: Secondary | ICD-10-CM | POA: Diagnosis not present

## 2022-05-29 DIAGNOSIS — R0602 Shortness of breath: Secondary | ICD-10-CM | POA: Diagnosis not present

## 2022-05-29 DIAGNOSIS — R638 Other symptoms and signs concerning food and fluid intake: Secondary | ICD-10-CM | POA: Diagnosis not present

## 2022-05-29 DIAGNOSIS — H18593 Other hereditary corneal dystrophies, bilateral: Secondary | ICD-10-CM | POA: Diagnosis not present

## 2022-05-29 DIAGNOSIS — H409 Unspecified glaucoma: Secondary | ICD-10-CM | POA: Diagnosis not present

## 2022-05-29 DIAGNOSIS — R634 Abnormal weight loss: Secondary | ICD-10-CM | POA: Diagnosis not present

## 2022-05-29 DIAGNOSIS — D62 Acute posthemorrhagic anemia: Secondary | ICD-10-CM | POA: Diagnosis not present

## 2022-05-29 DIAGNOSIS — M79604 Pain in right leg: Secondary | ICD-10-CM | POA: Diagnosis not present

## 2022-05-29 DIAGNOSIS — R1314 Dysphagia, pharyngoesophageal phase: Secondary | ICD-10-CM | POA: Diagnosis not present

## 2022-05-29 DIAGNOSIS — J45909 Unspecified asthma, uncomplicated: Secondary | ICD-10-CM | POA: Diagnosis not present

## 2022-05-29 DIAGNOSIS — Z961 Presence of intraocular lens: Secondary | ICD-10-CM | POA: Diagnosis not present

## 2022-05-29 DIAGNOSIS — D696 Thrombocytopenia, unspecified: Secondary | ICD-10-CM | POA: Diagnosis not present

## 2022-05-29 DIAGNOSIS — H401411 Capsular glaucoma with pseudoexfoliation of lens, right eye, mild stage: Secondary | ICD-10-CM | POA: Diagnosis not present

## 2022-05-29 DIAGNOSIS — E039 Hypothyroidism, unspecified: Secondary | ICD-10-CM | POA: Diagnosis not present

## 2022-05-29 DIAGNOSIS — H3530 Unspecified macular degeneration: Secondary | ICD-10-CM | POA: Diagnosis not present

## 2022-05-29 DIAGNOSIS — S72031D Displaced midcervical fracture of right femur, subsequent encounter for closed fracture with routine healing: Secondary | ICD-10-CM | POA: Diagnosis not present

## 2022-05-29 DIAGNOSIS — H1045 Other chronic allergic conjunctivitis: Secondary | ICD-10-CM | POA: Diagnosis not present

## 2022-05-29 DIAGNOSIS — M810 Age-related osteoporosis without current pathological fracture: Secondary | ICD-10-CM | POA: Diagnosis not present

## 2022-05-29 LAB — BASIC METABOLIC PANEL
Anion gap: 4 — ABNORMAL LOW (ref 5–15)
BUN: 9 mg/dL (ref 8–23)
CO2: 22 mmol/L (ref 22–32)
Calcium: 7.6 mg/dL — ABNORMAL LOW (ref 8.9–10.3)
Chloride: 105 mmol/L (ref 98–111)
Creatinine, Ser: 0.68 mg/dL (ref 0.44–1.00)
GFR, Estimated: >60 mL/min (ref >60)
Glucose, Bld: 116 mg/dL — ABNORMAL HIGH (ref 70–99)
Potassium: 3.2 mmol/L — ABNORMAL LOW (ref 3.5–5.1)
Sodium: 131 mmol/L — ABNORMAL LOW (ref 135–145)

## 2022-05-29 LAB — CBC
HCT: 26.6 % — ABNORMAL LOW (ref 36.0–46.0)
Hemoglobin: 9.1 g/dL — ABNORMAL LOW (ref 12.0–15.0)
MCH: 31.7 pg (ref 26.0–34.0)
MCHC: 34.2 g/dL (ref 30.0–36.0)
MCV: 92.7 fL (ref 80.0–100.0)
Platelets: 163 10*3/uL (ref 150–400)
RBC: 2.87 MIL/uL — ABNORMAL LOW (ref 3.87–5.11)
RDW: 13.7 % (ref 11.5–15.5)
WBC: 8.7 10*3/uL (ref 4.0–10.5)
nRBC: 0 % (ref 0.0–0.2)

## 2022-05-29 LAB — MAGNESIUM: Magnesium: 1.8 mg/dL (ref 1.7–2.4)

## 2022-05-29 MED ORDER — POTASSIUM CHLORIDE CRYS ER 20 MEQ PO TBCR
40.0000 meq | EXTENDED_RELEASE_TABLET | ORAL | Status: AC
  Administered 2022-05-29 (×2): 40 meq via ORAL
  Filled 2022-05-29 (×2): qty 2

## 2022-05-29 NOTE — Discharge Summary (Addendum)
Physician Discharge Summary  Megan Foster ZOX:[id number] DOB: 18-Nov-1927 DOA: 05/25/2022  PCP: Cleatis Polka., MD  Admit date: 05/25/2022 Discharge date: 05/29/2022  Time spent: 40 minutes  Recommendations for Outpatient Follow-up:  Follow outpatient CBC/CMP  Follow with orthopedics as recommended  Urinary retention, needs trial of void outpatient (within 7 days), urology follow up Repeat vitamin D level outpatient, hold vitamin D supplementation for now given high levels - follow with PCP to determine when/whether to resume  Discharge Diagnoses:  Principal Problem:   Closed displaced fracture of right femoral neck Active Problems:   Hyperlipidemia   ESOPHAGEAL MOTILITY DISORDER   GERD   Palpitations   HTN (hypertension)   Superior mesenteric artery stenosis   Chronic mesenteric ischemia   Fall at home   Discharge Condition: stable  Diet recommendation: heart healthy  Filed Weights   05/25/22 1115  Weight: 62.1 kg    History of present illness:   89 year old female, retired Glass blower/designer, with osteoporosis, chronic mesenteric ischemia with SMA stenosis status post balloon angioplasty, esophageal dysmotility, GERD, HTN, HLD, hiatal hernia, vitamin D deficiency who comes into the hospital after Megan Foster fall and subsequent hip fracture. She initially presented to [facility], and was transferred to [facility] for orthopedic surgery consultation.   S/p surgery on 4/14.  She's stable for discharge at this time, see below for additional details.  Hospital Course:  Assessment and Plan:  Right femoral neck fracture -orthopedic surgery consulted, patient was taken to the OR on 4/14 and is status post right hip hemiarthroplasty.  DVT prophylaxis with aspirin twice daily.  PT recommends SNF and patient is agreeable.   Touchdown wieghtbearing on RLE with walker.  Aspirin 81 mg BID x6 weeks.   Acute blood loss anemia, postoperative - s/p 1 unit pRBC - stable,  follow outpatient   Hypokalemia -replaced, follow outpatient   Thrombocytopenia -improved  Essential hypertension -continue home metoprolol   Hypothyroidism -continue home Synthroid   Osteoporosis, Vitamin D deficiency -vitamin D levels are actually quite high, will likely need to pause her vitamin D and follow outpatient regarding when and whether to resume   Hyperlipidemia -continue atorvastatin   Chronic allergic rhinitis -continue home medications  Urinary Retention - foley in place, needs TOV outpatient within 7 days, urology follow up  4 mm anterolisthesis at C3-5 - likely due to bilateral facet DJD at this level, she denies any neck pain or soreness -- follow outpatient as needed  Glaucoma- continue home meds    Procedures: 4/14 Right hip hemiarthroplasty, anterior approach.    Consultations: orthopedics  Discharge Exam: Vitals:   05/29/22 0443 05/29/22 1351  BP: (!) 154/75 (!) 118/56  Pulse: 98 82  Resp: 16 16  Temp: 97.7 F (36.5 C) 98.1 F (36.7 C)  SpO2: 96% 98%   No complaints today Was hoping to stay until Friday  Sister at bedside Discussed d/c plan   General: No acute distress. Cardiovascular: RRR Lungs: unlabored Abdomen: Soft, nontender, nondistended Neurological: Alert and oriented 3. Moves all extremities 4 with equal strength. Cranial nerves II through XII grossly intact. Extremities: No clubbing or cyanosis. No edema.   Discharge Instructions   Discharge Instructions     Call MD for:  difficulty breathing, headache or visual disturbances   Complete by: As directed    Call MD for:  extreme fatigue   Complete by: As directed    Call MD for:  hives   Complete by: As directed  Call MD for:  persistant dizziness or light-headedness   Complete by: As directed    Call MD for:  persistant nausea and vomiting   Complete by: As directed    Call MD for:  redness, tenderness, or signs of infection (pain, swelling, redness, odor or  green/yellow discharge around incision site)   Complete by: As directed    Call MD for:  severe uncontrolled pain   Complete by: As directed    Call MD for:  temperature >100.4   Complete by: As directed    Diet - low sodium heart healthy   Complete by: As directed    Discharge instructions   Complete by: As directed    You were seen for Megan Foster right femoral neck fracture.  You've been treated with surgery by orthopedics.  You discharge with aspirin twice daily for 6 weeks to prevent DVT's.  You're touchdown weightbearing to the right lower extremity with Megan Foster walker.    Your vitamin D level is high.  Stop this for now and follow with your PCP outpatient to see if this warrants resumption.  You have Megan Foster foley catheter in place.  You'll need to follow up with urology outpatient for Megan Foster trial of void within 7 days.  Return for new, recurrent, or worsening symptoms.  Please ask your PCP to request records from this hospitalization so they know what was done and what the next steps will be.   Increase activity slowly   Complete by: As directed       Allergies as of 05/29/2022   No Known Allergies      Medication List     STOP taking these medications    sucralfate 1 GM/10ML suspension Commonly known as: Carafate   Vitamin D (Ergocalciferol) 1.25 MG (50000 UNIT) Caps capsule Commonly known as: DRISDOL       TAKE these medications    acetaminophen 500 MG tablet Commonly known as: TYLENOL Take 1,000 mg by mouth in the morning and at bedtime.   aspirin 81 MG tablet Take 1 tablet (81 mg total) by mouth 2 (two) times daily with Megan Foster meal. What changed: when to take this   atorvastatin 20 MG tablet Commonly known as: LIPITOR Take 20 mg by mouth at bedtime.   dorzolamide-timolol 2-0.5 % ophthalmic solution Commonly known as: COSOPT Place 1 drop into the right eye 2 (two) times daily.   fexofenadine 180 MG tablet Commonly known as: ALLEGRA Take 180 mg by mouth every morning.    fluticasone 50 MCG/ACT nasal spray Commonly known as: Flonase Place 2 sprays into the nose daily.   HYDROcodone-acetaminophen 5-325 MG tablet Commonly known as: NORCO/VICODIN Take 1 tablet by mouth every 4 (four) hours as needed for moderate pain or severe pain.   ipratropium 0.03 % nasal spray Commonly known as: ATROVENT Place 2 sprays into both nostrils daily.   latanoprost 0.005 % ophthalmic solution Commonly known as: XALATAN Place 1 drop into both eyes at bedtime.   levocetirizine 5 MG tablet Commonly known as: XYZAL Take 5 mg by mouth every evening.   levothyroxine 88 MCG tablet Commonly known as: SYNTHROID Take 88 mcg by mouth daily before breakfast. *BRAND NAME ONLY   metoprolol succinate 50 MG 24 hr tablet Commonly known as: TOPROL-XL TAKE ONE TABLET BY MOUTH ONE TIME DAILY   montelukast 10 MG tablet Commonly known as: SINGULAIR Take 10 mg by mouth at bedtime.   Nasacort Allergy 24HR 55 MCG/ACT Aero nasal inhaler Generic drug: triamcinolone Place  2 sprays into the nose daily.   pantoprazole 40 MG tablet Commonly known as: PROTONIX TAKE 1 TABLET BY MOUTH 2 TIMES DAILY   PRESERVISION AREDS 2 PO Take 1 tablet by mouth 2 (two) times daily.   sodium chloride 2 % ophthalmic solution Commonly known as: MURO 128 Place 1 drop into both eyes in the morning, at noon, in the evening, and at bedtime.   sodium chloride 5 % ophthalmic ointment Commonly known as: MURO 128 Place 1 Application into both eyes at bedtime.   triamcinolone cream 0.5 % Commonly known as: KENALOG Apply 1 Application topically daily as needed (for irritation on hands).       No Known Allergies  Contact information for follow-up providers     Swinteck, Arlys John, MD. Schedule an appointment as soon as possible for Megan Foster visit in 2 week(s).   Specialty: Orthopedic Surgery Why: For suture removal, For wound re-check Contact information: [address] [place]  [address] [phone]         [facility] Follow up.   Contact information: [address] [place] [address] [phone]             Contact information for after-discharge care     Destination     [facility] Preferred SNF .   Service: Skilled Nursing Contact information: [address] [place] [address] [phone]                      The results of significant diagnostics from this hospitalization (including imaging, microbiology, ancillary and laboratory) are listed below for reference.    Significant Diagnostic Studies: DG Pelvis Portable  Result Date: 05/26/2022 CLINICAL DATA:  Postop. EXAM: PORTABLE PELVIS 1-2 VIEWS COMPARISON:  Preoperative right hip exam. FINDINGS: Right hip arthroplasty in expected alignment. Cerclage wire fixation throughout the femur. Recent postsurgical change includes air and edema in the soft tissues. Lateral skin staples in place. IMPRESSION: Right hip arthroplasty with cerclage wire fixation. No immediate postoperative complication. Electronically Signed   By: Narda Rutherford M.D.   On: 05/26/2022 15:19   DG HIP UNILAT WITH PELVIS 1V RIGHT  Result Date: 05/26/2022 CLINICAL DATA:  Elective surgery. EXAM: DG HIP (WITH OR WITHOUT PELVIS) 1V RIGHT COMPARISON:  Preoperative imaging. FINDINGS: Three fluoroscopic spot views of the pelvis and right hip obtained in the operating room. Right hip arthroplasty with cerclage wire fixation. Fluoroscopy time 38 seconds. Dose 3.34 mGy. IMPRESSION: Intraoperative fluoroscopy during right hip arthroplasty. Electronically Signed   By: Narda Rutherford M.D.   On: 05/26/2022 14:36   DG C-Arm 1-60 Min-No Report  Result Date: 05/26/2022 Fluoroscopy was utilized by the requesting physician.  No radiographic interpretation.   DG C-Arm 1-60 Min-No Report  Result Date: 05/26/2022 Fluoroscopy was utilized  by the requesting physician.  No radiographic interpretation.   DG C-Arm 1-60 Min-No Report  Result Date: 05/26/2022 Fluoroscopy was utilized by the requesting physician.  No radiographic interpretation.   DG C-Arm 1-60 Min-No Report  Result Date: 05/26/2022 Fluoroscopy was utilized by the requesting physician.  No radiographic interpretation.   CT Head Wo Contrast  Result Date: 05/25/2022 CLINICAL DATA:  Fall this morning.  Blunt head and neck trauma. EXAM: CT HEAD WITHOUT CONTRAST CT CERVICAL SPINE WITHOUT CONTRAST TECHNIQUE: Multidetector CT imaging of the head and cervical spine was performed following the standard protocol without intravenous contrast. Multiplanar CT image reconstructions of the  cervical spine were also generated. RADIATION DOSE REDUCTION: This exam was performed according to the departmental dose-optimization program which includes automated exposure control, adjustment of the mA and/or kV according to patient size and/or use of iterative reconstruction technique. COMPARISON:  None Available. FINDINGS: CT HEAD FINDINGS Brain: No evidence of intracranial hemorrhage, acute infarction, hydrocephalus, extra-axial collection, or mass lesion/mass effect. Mild diffuse cerebral atrophy and chronic small vessel disease noted. Vascular:  No hyperdense vessel or other acute findings. Skull: No evidence of fracture or other significant bone abnormality. Sinuses/Orbits: Near-complete opacification of maxillary, ethmoid, sphenoid, and frontal sinuses is seen. Other: None. CT CERVICAL SPINE FINDINGS Alignment: 4 mm anterolisthesis is seen at C3-4, likely due to bilateral facet DJD seen at this level. Skull base and vertebrae: No acute fracture. No primary bone lesion or focal pathologic process. Soft tissues and spinal canal: No prevertebral fluid or swelling. No visible canal hematoma. Disc levels: Severe degenerative disc disease is seen at C4-5, C5-6, and C6-7. Moderate facet DJD is seen  bilaterally at C2-3 and C3-4. Advanced atlantoaxial degenerative changes are also seen. Upper chest: No acute findings. Other: None. IMPRESSION: No acute intracranial abnormality. Mild cerebral atrophy and chronic small vessel disease. No evidence of acute cervical spine fracture. Degenerative spondylosis. 4 mm anterolisthesis at C3-4, likely due to bilateral facet DJD at this level. Recommend clinical correlation, and consider flexion-extension views of the cervical spine to assess for ligamentous instability. Electronically Signed   By: Danae Orleans M.D.   On: 05/25/2022 14:54   CT Cervical Spine Wo Contrast  Result Date: 05/25/2022 CLINICAL DATA:  Fall this morning.  Blunt head and neck trauma. EXAM: CT HEAD WITHOUT CONTRAST CT CERVICAL SPINE WITHOUT CONTRAST TECHNIQUE: Multidetector CT imaging of the head and cervical spine was performed following the standard protocol without intravenous contrast. Multiplanar CT image reconstructions of the cervical spine were also generated. RADIATION DOSE REDUCTION: This exam was performed according to the departmental dose-optimization program which includes automated exposure control, adjustment of the mA and/or kV according to patient size and/or use of iterative reconstruction technique. COMPARISON:  None Available. FINDINGS: CT HEAD FINDINGS Brain: No evidence of intracranial hemorrhage, acute infarction, hydrocephalus, extra-axial collection, or mass lesion/mass effect. Mild diffuse cerebral atrophy and chronic small vessel disease noted. Vascular:  No hyperdense vessel or other acute findings. Skull: No evidence of fracture or other significant bone abnormality. Sinuses/Orbits: Near-complete opacification of maxillary, ethmoid, sphenoid, and frontal sinuses is seen. Other: None. CT CERVICAL SPINE FINDINGS Alignment: 4 mm anterolisthesis is seen at C3-4, likely due to bilateral facet DJD seen at this level. Skull base and vertebrae: No acute fracture. No primary  bone lesion or focal pathologic process. Soft tissues and spinal canal: No prevertebral fluid or swelling. No visible canal hematoma. Disc levels: Severe degenerative disc disease is seen at C4-5, C5-6, and C6-7. Moderate facet DJD is seen bilaterally at C2-3 and C3-4. Advanced atlantoaxial degenerative changes are also seen. Upper chest: No acute findings. Other: None. IMPRESSION: No acute intracranial abnormality. Mild cerebral atrophy and chronic small vessel disease. No evidence of acute cervical spine fracture. Degenerative spondylosis. 4 mm anterolisthesis at C3-4, likely due to bilateral facet DJD at this level. Recommend clinical correlation, and consider flexion-extension views of the cervical spine to assess for ligamentous instability. Electronically Signed   By: Danae Orleans M.D.   On: 05/25/2022 14:54   DG Chest Port 1 View  Result Date: 05/25/2022 CLINICAL DATA:  Fall EXAM: PORTABLE CHEST 1 VIEW COMPARISON:  07/24/2004 FINDINGS: The heart size and mediastinal contours are within normal limits. Aortic atherosclerosis. Calcified granuloma in the right mid lung. Both lungs are clear. No pleural effusion or pneumothorax. The visualized skeletal structures are unremarkable. IMPRESSION: No active disease. Electronically Signed   By: Duanne Guess D.O.   On: 05/25/2022 13:33   DG Sacrum/Coccyx  Result Date: 05/25/2022 CLINICAL DATA:  Fall EXAM: SACRUM AND COCCYX - 2+ VIEW COMPARISON:  None Available. FINDINGS: There is no evidence of fracture or other focal bone lesions involving the sacrum or coccyx. Evaluation is limited in the absence of Keiko Myricks lateral view. Acute right femoral neck fracture with impaction and superior migration. IMPRESSION: 1. Limited exam without evidence of sacrococcygeal fracture. 2. Acute right femoral neck fracture. Electronically Signed   By: Duanne Guess D.O.   On: 05/25/2022 13:31   DG Knee Complete 4 Views Right  Result Date: 05/25/2022 CLINICAL DATA:  Fall EXAM:  RIGHT KNEE - COMPLETE 4+ VIEW COMPARISON:  None Available. FINDINGS: No evidence of fracture or dislocation. Small-moderate knee joint effusion. Moderate degenerative changes of the right knee with joint space narrowing and chondrocalcinosis. Soft tissues are unremarkable. IMPRESSION: 1. No acute fracture or dislocation. 2. Small-moderate knee joint effusion, nonspecific. 3. Moderate degenerative changes of the right knee. Electronically Signed   By: Duanne Guess D.O.   On: 05/25/2022 13:29   DG Hip Unilat W or Wo Pelvis 2-3 Views Right  Result Date: 05/25/2022 CLINICAL DATA:  Fall EXAM: DG HIP (WITH OR WITHOUT PELVIS) 2-3V RIGHT COMPARISON:  None Available. FINDINGS: Acute comminuted fracture of the right femoral neck with fracture impaction and shortening. No appreciable involvement of the intertrochanteric region. No hip joint dislocation. Bony pelvis appears intact without evidence of fracture or diastasis. IMPRESSION: Acute comminuted fracture of the right femoral neck with fracture impaction and shortening. Electronically Signed   By: Duanne Guess D.O.   On: 05/25/2022 13:29    Microbiology: Recent Results (from the past 240 hour(s))  Surgical pcr screen     Status: None   Collection Time: 05/25/22 12:10 AM   Specimen: Nasal Mucosa; Nasal Swab  Result Value Ref Range Status   MRSA, PCR NEGATIVE NEGATIVE Final   Staphylococcus aureus NEGATIVE NEGATIVE Final    Comment: (NOTE) The Xpert SA Assay (FDA approved for NASAL specimens in patients 47 years of age and older), is one component of Dirk Vanaman comprehensive surveillance program. It is not intended to diagnose infection nor to guide or monitor treatment. Performed at [facility], [address], [place], [place] [address]      Labs: Basic Metabolic Panel: Recent Labs  Lab 05/25/22 1126 05/26/22 0334 05/27/22 0325 05/28/22 0340 05/29/22 0334  NA 136 135  --  133* 131*  K 4.1 3.7  --  3.1* 3.2*  CL 103 103   --  105 105  CO2 25 24  --  21* 22  GLUCOSE 114* 137*  --  120* 116*  BUN 19 16  --  12 9  CREATININE 0.82 0.78  --  0.71 0.68  CALCIUM 9.6 8.6*  --  7.7* 7.6*  MG  --   --  1.6* 1.9 1.8   Liver Function Tests: Recent Labs  Lab 05/28/22 0340  AST 71*  ALT 18  ALKPHOS 46  BILITOT 1.0  PROT 5.1*  ALBUMIN 2.7*   No results for input(s): "LIPASE", "AMYLASE" in the last 168 hours. No results for input(s): "AMMONIA" in the last 168 hours.  CBC: Recent Labs  Lab 05/25/22 1126 05/26/22 0334 05/27/22 0325 05/27/22 1557 05/28/22 0340 05/29/22 0334  WBC 9.9 8.4 6.5 8.4 7.2 8.7  NEUTROABS 7.7  --   --   --   --   --   HGB 14.3 13.4 8.3* 7.9* 8.9* 9.1*  HCT 42.4 40.4 26.1* 24.3* 26.8* 26.6*  MCV 96.1 97.1 102.0* 101.3* 94.4 92.7  PLT 196 195 134* 147* 120* 163   Cardiac Enzymes: No results for input(s): "CKTOTAL", "CKMB", "CKMBINDEX", "TROPONINI" in the last 168 hours. BNP: BNP (last 3 results) No results for input(s): "BNP" in the last 8760 hours.  ProBNP (last 3 results) No results for input(s): "PROBNP" in the last 8760 hours.  CBG: No results for input(s): "GLUCAP" in the last 168 hours.     Signed:  Lacretia Nicks MD.  [facility] 05/29/2022, 2:29 PM

## 2022-05-29 NOTE — Plan of Care (Signed)

## 2022-05-29 NOTE — TOC Transition Note (Signed)
Transition of Care [facility]) - CM/SW Discharge Note  Patient Details  Name: Megan Foster MRN: [id number] Date of Birth: 1927-12-08  Transition of Care [facility]) CM/SW Contact:  Ewing Schlein, LCSW Phone Number: 05/29/2022, 2:55 PM  Clinical Narrative: Patient is medically ready to discharge to [facility]. Patient will go to room 59 and the number for report is [phone]. Discharge summary, discharge orders, and SNF transfer report faxed to facility in hub. CSW notified sister and niece regarding discharge. Medical necessity form done; PTAR scheduled. Discharge packet completed. RN updated. TOC signing off.  Final next level of care: Skilled Nursing Facility Barriers to Discharge: Barriers Resolved  Patient Goals and CMS Choice CMS Medicare.gov Compare Post Acute Care list provided to:: Patient Represenative (must comment) Choice offered to / list presented to : Patient, Sibling  Discharge Placement PASRR number recieved: 05/27/22 Patient chooses bed at: Other - please specify in the comment section below: [facility]) Patient to be transferred to facility by: PTAR Name of family member notified: Solon Palm (sister), Pricilla Loveless (niece) Patient and family notified of of transfer: 05/29/22  Discharge Plan and Services Additional resources added to the After Visit Summary for   In-house Referral: Clinical Social Work Post Acute Care Choice: Skilled Nursing Facility          DME Arranged: N/A DME Agency: NA  Social Determinants of Health (SDOH) Interventions SDOH Screenings   Food Insecurity: No Food Insecurity (05/25/2022)  Housing: Low Risk  (05/25/2022)  Transportation Needs: No Transportation Needs (05/25/2022)  Utilities: Not At Risk (05/25/2022)  Tobacco Use: Low Risk  (05/25/2022)   Readmission Risk Interventions     No data to display

## 2022-05-29 NOTE — Progress Notes (Signed)
Occupational Therapy Treatment Patient Details Name: Megan Foster MRN: [id number] DOB: Nov 30, 1927 Today's Date: 05/29/2022   History of present illness Patient presented to the hospital on 4/13 after a fall at home resulting in pain in R hip and inability to bear weight. Patient was found to have displaced right femoral neck fracture. Patient underwent right hip hemiarthroplasty with anterior approach on 4/14. WUJ:[id number], depression, asthma, macular degeneration,   OT comments  Pt progressing well toward established OT goals. Pt able to don L sock this session at bed level. SPT to [facility] with +2 mod A and significant cueing to ensure maintenance of weightbearing precautions as well as placement of therapist's foot under pt's to monitor maintenance. Pt requiring total A for posterior pericare. Returned to bed with all needs met.    Recommendations for follow up therapy are one component of a multi-disciplinary discharge planning process, led by the attending physician.  Recommendations may be updated based on patient status, additional functional criteria and insurance authorization.    Assistance Recommended at Discharge Frequent or constant Supervision/Assistance  Patient can return home with the following  Two people to help with walking and/or transfers;Two people to help with bathing/dressing/bathroom;Direct supervision/assist for financial management;Help with stairs or ramp for entrance;Assist for transportation;Direct supervision/assist for medications management;Assistance with cooking/housework   Equipment Recommendations  None recommended by OT    Recommendations for Other Services      Precautions / Restrictions Precautions Precautions: Fall Restrictions Weight Bearing Restrictions: Yes RLE Weight Bearing: Touchdown weight bearing       Mobility Bed Mobility Overal bed mobility: Needs Assistance Bed Mobility: Supine to Sit, Sit to Supine     Supine to sit: Min  assist Sit to supine: Min assist   General bed mobility comments: Min A for RLE acceleration to EOB and back into bed    Transfers Overall transfer level: Needs assistance Equipment used: Rolling walker (2 wheels) Transfers: Sit to/from Stand, Bed to chair/wheelchair/BSC Sit to Stand: Min assist, +2 physical assistance Stand pivot transfers: Mod assist, +2 safety/equipment, +2 physical assistance         General transfer comment: Assist to rise and transition hands to RW. Cues for hand placement. Aassist for maintenance of weightbearing precautions despite pt with good awarenss of precautions.     Balance Overall balance assessment: Needs assistance Sitting-balance support: Feet supported, Single extremity supported, Bilateral upper extremity supported Sitting balance-Leahy Scale: Fair     Standing balance support: Reliant on assistive device for balance, Bilateral upper extremity supported Standing balance-Leahy Scale: Poor Standing balance comment: reliant on UE and external support                           ADL either performed or assessed with clinical judgement   ADL Overall ADL's : Needs assistance/impaired                     Lower Body Dressing: Moderate assistance;Bed level Lower Body Dressing Details (indicate cue type and reason): able to don L sock, but total A to don R Toilet Transfer: Moderate assistance;+2 for physical assistance;+2 for safety/equipment;Stand-pivot;BSC/3in1;Rolling walker (2 wheels) Toilet Transfer Details (indicate cue type and reason): Mod +2 for pivot to BSC. cueing for sequencing of steps Toileting- Clothing Manipulation and Hygiene: Total assistance;Sit to/from stand Toileting - Clothing Manipulation Details (indicate cue type and reason): total A for posterior pericare as pt requiring BUE to maintain standing balance with weightbearing precautions.  Functional mobility during ADLs: +2 for physical assistance;+2 for  safety/equipment;Moderate assistance General ADL Comments: Mod A +2 to maintain precautions. As little as min A for side steps as pt able to pivot, but needing significant assist for steps for ward and back.    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 Otherwise WFL. Doctor present in session and pt asking meanindful questions.        Exercises      Shoulder Instructions       General Comments VSS    Pertinent Vitals/ Pain       Pain Assessment Pain Assessment: Faces Faces Pain Scale: Hurts little more Pain Location: R hip with mobility Pain Descriptors / Indicators: Discomfort, Grimacing, Guarding Pain Intervention(s): Limited activity within patient's tolerance, Monitored during session, Repositioned  Home Living                                          Prior Functioning/Environment              Frequency  Min 2X/week        Progress Toward Goals  OT Goals(current goals can now be found in the care plan section)  Progress towards OT goals: Progressing toward goals  Acute Rehab OT Goals Patient Stated Goal: go to rehab OT Goal Formulation: With patient Time For Goal Achievement: 06/10/22 Potential to Achieve Goals: Fair ADL Goals Pt Will Perform Grooming: with set-up;sitting Pt Will Perform Lower Body Dressing: with adaptive equipment;with min assist;sitting/lateral  leans;sit to/from stand Pt Will Transfer to Toilet: squat pivot transfer;bedside commode;with min assist  Plan Discharge plan remains appropriate;Frequency remains appropriate    Co-evaluation                 AM-PAC OT "6 Clicks" Daily Activity     Outcome Measure   Help from another person eating meals?: A Little Help from another person taking care of personal grooming?: A Little Help from another person toileting, which includes using toliet, bedpan, or urinal?: A Lot Help from another person bathing (including washing, rinsing, drying)?: A Lot Help from another person to put on and taking off regular upper body clothing?: A Lot Help from another person to put on and taking off regular lower body clothing?: A Lot 6 Click Score: 14    End of Session Equipment Utilized During Treatment: Gait belt;Rolling walker (2 wheels)  OT Visit Diagnosis: Unsteadiness on feet (R26.81);Other abnormalities of gait and mobility (R26.89);Muscle weakness (generalized) (M62.81)   Activity Tolerance Patient tolerated treatment well   Patient Left in bed;with call bell/phone within reach;with bed alarm set;with family/visitor present   Nurse Communication Mobility status;Precautions;Weight bearing status        Time:  1610-9604 OT Time Calculation (min): 42 min  Charges: OT General Charges $OT Visit: 1 Visit OT Treatments $Self Care/Home Management : 38-52 mins  Tyler Deis, OTR/L [facility] Acute Rehabilitation Office: [phone]   Megan Foster 05/29/2022, 4:49 PM

## 2022-05-29 NOTE — Progress Notes (Signed)
Bladder scan 658. Foley catheter placed.

## 2022-05-30 DIAGNOSIS — E559 Vitamin D deficiency, unspecified: Secondary | ICD-10-CM | POA: Diagnosis not present

## 2022-05-30 DIAGNOSIS — E039 Hypothyroidism, unspecified: Secondary | ICD-10-CM | POA: Diagnosis not present

## 2022-05-30 DIAGNOSIS — H409 Unspecified glaucoma: Secondary | ICD-10-CM | POA: Diagnosis not present

## 2022-05-30 DIAGNOSIS — E785 Hyperlipidemia, unspecified: Secondary | ICD-10-CM | POA: Diagnosis not present

## 2022-05-30 DIAGNOSIS — H3530 Unspecified macular degeneration: Secondary | ICD-10-CM | POA: Diagnosis not present

## 2022-06-03 DIAGNOSIS — D696 Thrombocytopenia, unspecified: Secondary | ICD-10-CM | POA: Diagnosis not present

## 2022-06-03 DIAGNOSIS — J45909 Unspecified asthma, uncomplicated: Secondary | ICD-10-CM | POA: Diagnosis not present

## 2022-06-03 DIAGNOSIS — J309 Allergic rhinitis, unspecified: Secondary | ICD-10-CM | POA: Diagnosis not present

## 2022-06-03 DIAGNOSIS — E785 Hyperlipidemia, unspecified: Secondary | ICD-10-CM | POA: Diagnosis not present

## 2022-06-03 DIAGNOSIS — K219 Gastro-esophageal reflux disease without esophagitis: Secondary | ICD-10-CM | POA: Diagnosis not present

## 2022-06-03 DIAGNOSIS — K5900 Constipation, unspecified: Secondary | ICD-10-CM | POA: Diagnosis not present

## 2022-06-03 DIAGNOSIS — R7401 Elevation of levels of liver transaminase levels: Secondary | ICD-10-CM | POA: Diagnosis not present

## 2022-06-03 DIAGNOSIS — H409 Unspecified glaucoma: Secondary | ICD-10-CM | POA: Diagnosis not present

## 2022-06-03 DIAGNOSIS — M810 Age-related osteoporosis without current pathological fracture: Secondary | ICD-10-CM | POA: Diagnosis not present

## 2022-06-03 DIAGNOSIS — S72001D Fracture of unspecified part of neck of right femur, subsequent encounter for closed fracture with routine healing: Secondary | ICD-10-CM | POA: Diagnosis not present

## 2022-06-03 DIAGNOSIS — E039 Hypothyroidism, unspecified: Secondary | ICD-10-CM | POA: Diagnosis not present

## 2022-06-03 DIAGNOSIS — R339 Retention of urine, unspecified: Secondary | ICD-10-CM | POA: Diagnosis not present

## 2022-06-07 DIAGNOSIS — R338 Other retention of urine: Secondary | ICD-10-CM | POA: Diagnosis not present

## 2022-06-10 DIAGNOSIS — M810 Age-related osteoporosis without current pathological fracture: Secondary | ICD-10-CM | POA: Diagnosis not present

## 2022-06-10 DIAGNOSIS — H409 Unspecified glaucoma: Secondary | ICD-10-CM | POA: Diagnosis not present

## 2022-06-10 DIAGNOSIS — E785 Hyperlipidemia, unspecified: Secondary | ICD-10-CM | POA: Diagnosis not present

## 2022-06-10 DIAGNOSIS — R339 Retention of urine, unspecified: Secondary | ICD-10-CM | POA: Diagnosis not present

## 2022-06-10 DIAGNOSIS — K5900 Constipation, unspecified: Secondary | ICD-10-CM | POA: Diagnosis not present

## 2022-06-10 DIAGNOSIS — R7401 Elevation of levels of liver transaminase levels: Secondary | ICD-10-CM | POA: Diagnosis not present

## 2022-06-10 DIAGNOSIS — D62 Acute posthemorrhagic anemia: Secondary | ICD-10-CM | POA: Diagnosis not present

## 2022-06-10 DIAGNOSIS — I10 Essential (primary) hypertension: Secondary | ICD-10-CM | POA: Diagnosis not present

## 2022-06-10 DIAGNOSIS — S72031D Displaced midcervical fracture of right femur, subsequent encounter for closed fracture with routine healing: Secondary | ICD-10-CM | POA: Diagnosis not present

## 2022-06-10 DIAGNOSIS — K219 Gastro-esophageal reflux disease without esophagitis: Secondary | ICD-10-CM | POA: Diagnosis not present

## 2022-06-10 DIAGNOSIS — J309 Allergic rhinitis, unspecified: Secondary | ICD-10-CM | POA: Diagnosis not present

## 2022-06-10 DIAGNOSIS — J45909 Unspecified asthma, uncomplicated: Secondary | ICD-10-CM | POA: Diagnosis not present

## 2022-06-10 DIAGNOSIS — E039 Hypothyroidism, unspecified: Secondary | ICD-10-CM | POA: Diagnosis not present

## 2022-06-19 DIAGNOSIS — E039 Hypothyroidism, unspecified: Secondary | ICD-10-CM | POA: Diagnosis not present

## 2022-06-19 DIAGNOSIS — J309 Allergic rhinitis, unspecified: Secondary | ICD-10-CM | POA: Diagnosis not present

## 2022-06-19 DIAGNOSIS — J45909 Unspecified asthma, uncomplicated: Secondary | ICD-10-CM | POA: Diagnosis not present

## 2022-06-19 DIAGNOSIS — H409 Unspecified glaucoma: Secondary | ICD-10-CM | POA: Diagnosis not present

## 2022-06-19 DIAGNOSIS — I10 Essential (primary) hypertension: Secondary | ICD-10-CM | POA: Diagnosis not present

## 2022-06-19 DIAGNOSIS — S72001D Fracture of unspecified part of neck of right femur, subsequent encounter for closed fracture with routine healing: Secondary | ICD-10-CM | POA: Diagnosis not present

## 2022-06-19 DIAGNOSIS — K5900 Constipation, unspecified: Secondary | ICD-10-CM | POA: Diagnosis not present

## 2022-06-19 DIAGNOSIS — R339 Retention of urine, unspecified: Secondary | ICD-10-CM | POA: Diagnosis not present

## 2022-06-19 DIAGNOSIS — K219 Gastro-esophageal reflux disease without esophagitis: Secondary | ICD-10-CM | POA: Diagnosis not present

## 2022-06-19 DIAGNOSIS — D62 Acute posthemorrhagic anemia: Secondary | ICD-10-CM | POA: Diagnosis not present

## 2022-06-19 DIAGNOSIS — N390 Urinary tract infection, site not specified: Secondary | ICD-10-CM | POA: Diagnosis not present

## 2022-06-26 DIAGNOSIS — E785 Hyperlipidemia, unspecified: Secondary | ICD-10-CM | POA: Diagnosis not present

## 2022-06-26 DIAGNOSIS — J45909 Unspecified asthma, uncomplicated: Secondary | ICD-10-CM | POA: Diagnosis not present

## 2022-06-26 DIAGNOSIS — R339 Retention of urine, unspecified: Secondary | ICD-10-CM | POA: Diagnosis not present

## 2022-06-26 DIAGNOSIS — H409 Unspecified glaucoma: Secondary | ICD-10-CM | POA: Diagnosis not present

## 2022-06-26 DIAGNOSIS — N390 Urinary tract infection, site not specified: Secondary | ICD-10-CM | POA: Diagnosis not present

## 2022-06-26 DIAGNOSIS — S72001D Fracture of unspecified part of neck of right femur, subsequent encounter for closed fracture with routine healing: Secondary | ICD-10-CM | POA: Diagnosis not present

## 2022-06-26 DIAGNOSIS — I10 Essential (primary) hypertension: Secondary | ICD-10-CM | POA: Diagnosis not present

## 2022-06-26 DIAGNOSIS — K5900 Constipation, unspecified: Secondary | ICD-10-CM | POA: Diagnosis not present

## 2022-06-26 DIAGNOSIS — J309 Allergic rhinitis, unspecified: Secondary | ICD-10-CM | POA: Diagnosis not present

## 2022-06-27 DIAGNOSIS — E785 Hyperlipidemia, unspecified: Secondary | ICD-10-CM | POA: Diagnosis not present

## 2022-06-27 DIAGNOSIS — J45909 Unspecified asthma, uncomplicated: Secondary | ICD-10-CM | POA: Diagnosis not present

## 2022-06-27 DIAGNOSIS — M810 Age-related osteoporosis without current pathological fracture: Secondary | ICD-10-CM | POA: Diagnosis not present

## 2022-06-27 DIAGNOSIS — D62 Acute posthemorrhagic anemia: Secondary | ICD-10-CM | POA: Diagnosis not present

## 2022-06-27 DIAGNOSIS — K5900 Constipation, unspecified: Secondary | ICD-10-CM | POA: Diagnosis not present

## 2022-06-27 DIAGNOSIS — E039 Hypothyroidism, unspecified: Secondary | ICD-10-CM | POA: Diagnosis not present

## 2022-06-27 DIAGNOSIS — H409 Unspecified glaucoma: Secondary | ICD-10-CM | POA: Diagnosis not present

## 2022-06-27 DIAGNOSIS — N390 Urinary tract infection, site not specified: Secondary | ICD-10-CM | POA: Diagnosis not present

## 2022-06-27 DIAGNOSIS — J309 Allergic rhinitis, unspecified: Secondary | ICD-10-CM | POA: Diagnosis not present

## 2022-06-27 DIAGNOSIS — R339 Retention of urine, unspecified: Secondary | ICD-10-CM | POA: Diagnosis not present

## 2022-06-27 DIAGNOSIS — K219 Gastro-esophageal reflux disease without esophagitis: Secondary | ICD-10-CM | POA: Diagnosis not present

## 2022-06-27 DIAGNOSIS — S72001D Fracture of unspecified part of neck of right femur, subsequent encounter for closed fracture with routine healing: Secondary | ICD-10-CM | POA: Diagnosis not present

## 2022-06-28 DIAGNOSIS — R338 Other retention of urine: Secondary | ICD-10-CM | POA: Diagnosis not present

## 2022-07-09 DIAGNOSIS — S72031D Displaced midcervical fracture of right femur, subsequent encounter for closed fracture with routine healing: Secondary | ICD-10-CM | POA: Diagnosis not present

## 2022-07-10 DIAGNOSIS — E860 Dehydration: Secondary | ICD-10-CM | POA: Diagnosis not present

## 2022-07-10 DIAGNOSIS — E785 Hyperlipidemia, unspecified: Secondary | ICD-10-CM | POA: Diagnosis not present

## 2022-07-10 DIAGNOSIS — R4182 Altered mental status, unspecified: Secondary | ICD-10-CM | POA: Diagnosis not present

## 2022-07-10 DIAGNOSIS — K5900 Constipation, unspecified: Secondary | ICD-10-CM | POA: Diagnosis not present

## 2022-07-10 DIAGNOSIS — I10 Essential (primary) hypertension: Secondary | ICD-10-CM | POA: Diagnosis not present

## 2022-07-10 DIAGNOSIS — M810 Age-related osteoporosis without current pathological fracture: Secondary | ICD-10-CM | POA: Diagnosis not present

## 2022-07-10 DIAGNOSIS — R339 Retention of urine, unspecified: Secondary | ICD-10-CM | POA: Diagnosis not present

## 2022-07-10 DIAGNOSIS — H409 Unspecified glaucoma: Secondary | ICD-10-CM | POA: Diagnosis not present

## 2022-07-10 DIAGNOSIS — D62 Acute posthemorrhagic anemia: Secondary | ICD-10-CM | POA: Diagnosis not present

## 2022-07-10 DIAGNOSIS — J309 Allergic rhinitis, unspecified: Secondary | ICD-10-CM | POA: Diagnosis not present

## 2022-07-10 DIAGNOSIS — E039 Hypothyroidism, unspecified: Secondary | ICD-10-CM | POA: Diagnosis not present

## 2022-07-10 DIAGNOSIS — K219 Gastro-esophageal reflux disease without esophagitis: Secondary | ICD-10-CM | POA: Diagnosis not present

## 2022-07-11 ENCOUNTER — Encounter (HOSPITAL_COMMUNITY): Payer: Self-pay

## 2022-07-11 ENCOUNTER — Emergency Department (HOSPITAL_COMMUNITY): Payer: Medicare Other

## 2022-07-11 ENCOUNTER — Other Ambulatory Visit: Payer: Self-pay

## 2022-07-11 ENCOUNTER — Emergency Department (HOSPITAL_COMMUNITY)
Admission: EM | Admit: 2022-07-11 | Discharge: 2022-07-11 | Disposition: A | Discharge status: Home / Self Care | Payer: Medicare Other | Attending: Emergency Medicine | Admitting: Emergency Medicine

## 2022-07-11 DIAGNOSIS — Z7982 Long term (current) use of aspirin: Secondary | ICD-10-CM | POA: Diagnosis not present

## 2022-07-11 DIAGNOSIS — M25551 Pain in right hip: Secondary | ICD-10-CM | POA: Insufficient documentation

## 2022-07-11 DIAGNOSIS — W19XXXA Unspecified fall, initial encounter: Secondary | ICD-10-CM

## 2022-07-11 DIAGNOSIS — F0390 Unspecified dementia without behavioral disturbance: Secondary | ICD-10-CM | POA: Diagnosis not present

## 2022-07-11 DIAGNOSIS — M25561 Pain in right knee: Secondary | ICD-10-CM | POA: Diagnosis not present

## 2022-07-11 DIAGNOSIS — W1830XA Fall on same level, unspecified, initial encounter: Secondary | ICD-10-CM | POA: Diagnosis not present

## 2022-07-11 DIAGNOSIS — M25562 Pain in left knee: Secondary | ICD-10-CM | POA: Insufficient documentation

## 2022-07-11 HISTORY — DX: Unspecified glaucoma: H40.9

## 2022-07-11 HISTORY — DX: Unspecified dementia, unspecified severity, without behavioral disturbance, psychotic disturbance, mood disturbance, and anxiety: F03.90

## 2022-07-11 NOTE — ED Provider Notes (Signed)
[place] EMERGENCY DEPARTMENT AT [facility] Provider Note   CSN: [id number] Arrival date & time: 07/11/22  1807     History Chief Complaint  Patient presents with   Megan Foster is a 89 y.o. female patient with history of dementia who presents to the emergency department today for further evaluation of a fall with subsequent right hip and right knee pain.  Patient states she was trying to get out of the wheelchair and onto the couch when she lost her footing and slid to the floor.  Patient does have a recent repair of a previous hip fracture on the right.  She did not hit her head or lose consciousness.   Fall       Home Medications Prior to Admission medications   Medication Sig Start Date End Date Taking? Authorizing Provider  acetaminophen (TYLENOL) 500 MG tablet Take 1,000 mg by mouth in the morning and at bedtime.    [provider]  aspirin 81 MG tablet Take 1 tablet (81 mg total) by mouth 2 (two) times daily with a meal. 05/27/22 07/11/22  Hill, Alain Honey, PA-C  atorvastatin (LIPITOR) 20 MG tablet Take 20 mg by mouth at bedtime.    [provider]  dorzolamide-timolol (COSOPT) 2-0.5 % ophthalmic solution Place 1 drop into the right eye 2 (two) times daily.    [provider]  fexofenadine (ALLEGRA) 180 MG tablet Take 180 mg by mouth every morning.    [provider]  fluticasone (FLONASE) 50 MCG/ACT nasal spray Place 2 sprays into the nose daily. 12/18/10 05/25/22  [place], Peter M, MD  HYDROcodone-acetaminophen (NORCO/VICODIN) 5-325 MG tablet Take 1 tablet by mouth every 4 (four) hours as needed for moderate pain or severe pain. 05/27/22   Clois Dupes, PA-C  ipratropium (ATROVENT) 0.03 % nasal spray Place 2 sprays into both nostrils daily. 03/01/22   [provider]  latanoprost (XALATAN) 0.005 % ophthalmic solution Place 1 drop into both eyes at bedtime.    [provider]  levocetirizine (XYZAL) 5 MG  tablet Take 5 mg by mouth every evening.    [provider]  levothyroxine (SYNTHROID, LEVOTHROID) 88 MCG tablet Take 88 mcg by mouth daily before breakfast. *BRAND NAME ONLY    [provider]  metoprolol succinate (TOPROL-XL) 50 MG 24 hr tablet TAKE ONE TABLET BY MOUTH ONE TIME DAILY Patient taking differently: Take 50 mg by mouth daily. 09/02/12   [place], Peter M, MD  montelukast (SINGULAIR) 10 MG tablet Take 10 mg by mouth at bedtime.    [provider]  Multiple Vitamins-Minerals (PRESERVISION AREDS 2 PO) Take 1 tablet by mouth 2 (two) times daily.     [provider]  pantoprazole (PROTONIX) 40 MG tablet TAKE 1 TABLET BY MOUTH 2 TIMES DAILY Patient taking differently: Take 40 mg by mouth 2 (two) times daily. 05/10/20   Cirigliano, Vito V, DO  sodium chloride (MURO 128) 2 % ophthalmic solution Place 1 drop into both eyes in the morning, at noon, in the evening, and at bedtime.    [provider]  sodium chloride (MURO 128) 5 % ophthalmic ointment Place 1 Application into both eyes at bedtime.    [provider]  triamcinolone (NASACORT ALLERGY 24HR) 55 MCG/ACT AERO nasal inhaler Place 2 sprays into the nose daily.    [provider]  triamcinolone cream (KENALOG) 0.5 % Apply 1 Application topically daily as needed (for irritation on hands).  01/18/19   [provider]      Allergies    Patient has no known allergies.    Review of Systems   Review of Systems  All other systems reviewed and are negative.   Physical Exam Updated Vital Signs BP 128/63   Pulse 92   Temp 97.7 F (36.5 C) (Oral)   Resp 18   Ht 5\' 5"  (1.651 m)   Wt 62 kg   SpO2 99%   BMI 22.75 kg/m  Physical Exam Vitals and nursing note reviewed.  Constitutional:      Appearance: Normal appearance.  HENT:     Head: Normocephalic and atraumatic.  Eyes:     General:        Right eye: No discharge.        Left eye: No discharge.      Conjunctiva/sclera: Conjunctivae normal.  Pulmonary:     Effort: Pulmonary effort is normal.  Musculoskeletal:     Comments: Right hip is mildly tender to palpation.  There is no obvious deformity.  Right knee and left knee are tender to palpation and minimally swollen.  2+ dorsalis pedis pulses felt on the left and right and are equal.  Skin:    General: Skin is warm and dry.     Findings: No rash.  Neurological:     General: No focal deficit present.     Mental Status: She is alert.  Psychiatric:        Mood and Affect: Mood normal.        Behavior: Behavior normal.     ED Results / Procedures / Treatments   Labs (all labs ordered are listed, but only abnormal results are displayed) Labs Reviewed - No data to display  EKG None  Radiology No results found.  Procedures Procedures    Medications Ordered in ED Medications - No data to display  ED Course/ Medical Decision Making/ A&P Clinical Course as of 07/11/22 1947  Thu Jul 11, 2022  6073 I personally ordered and interpreted imaging of the right hip and bilateral knees.  I do not see any evidence of fracture.  I do agree with the radiologist interpretation. [CF]    Clinical Course User Index [CF] Teressa Lower, PA-C   {   Click here for ABCD2, HEART and other calculators  Medical Decision Making Megan Foster is a 89 y.o. female patient who presents to the emergency department today for further evaluation of a fall with subsequent right hip pain and bilateral knee pain.  Considering the mechanism of injury we will get ridging of the right hip and knees.  Patient did not hit her head or lose consciousness.  Not feel that imaging of the head and neck are warranted at this time.  Imaging was all normal.  This is highlighted in ED course.  I will have her follow-up with her primary care doctor for further evaluation.  Strict turn precaution were discussed.  She is safe for discharge at this time.  Amount and/or  Complexity of Data Reviewed Radiology: ordered.    Final Clinical Impression(s) / ED Diagnoses Final diagnoses:  None    Rx / DC Orders ED Discharge Orders     None         Jolyn Lent 07/11/22 1948    Charlynne Pander, MD 07/11/22 2105

## 2022-07-11 NOTE — ED Triage Notes (Signed)
Patient BIB [facility] from [facility]. Patient was sitting in her wheelchair, reached out and slipped out of it. Complaining of right hip and bilateral knee pain. Had a right knee replacement 1 month ago. Has dementia.

## 2022-07-11 NOTE — ED Notes (Signed)
[facility] called for transportation back to [facility].

## 2022-07-11 NOTE — ED Notes (Signed)
[facility] arrival. 

## 2022-07-11 NOTE — Discharge Instructions (Signed)
As we discussed today, imaging was normal.  There were no evidence of broken bones which is great news.  I would like you to follow-up with your primary care doctor for further evaluation.  Please return to the emergency room for any worsening symptoms you might have.

## 2022-07-15 DIAGNOSIS — N390 Urinary tract infection, site not specified: Secondary | ICD-10-CM | POA: Diagnosis not present

## 2022-07-15 DIAGNOSIS — J45909 Unspecified asthma, uncomplicated: Secondary | ICD-10-CM | POA: Diagnosis not present

## 2022-07-15 DIAGNOSIS — K219 Gastro-esophageal reflux disease without esophagitis: Secondary | ICD-10-CM | POA: Diagnosis not present

## 2022-07-15 DIAGNOSIS — J309 Allergic rhinitis, unspecified: Secondary | ICD-10-CM | POA: Diagnosis not present

## 2022-07-15 DIAGNOSIS — E860 Dehydration: Secondary | ICD-10-CM | POA: Diagnosis not present

## 2022-07-15 DIAGNOSIS — R339 Retention of urine, unspecified: Secondary | ICD-10-CM | POA: Diagnosis not present

## 2022-07-15 DIAGNOSIS — R638 Other symptoms and signs concerning food and fluid intake: Secondary | ICD-10-CM | POA: Diagnosis not present

## 2022-07-15 DIAGNOSIS — I10 Essential (primary) hypertension: Secondary | ICD-10-CM | POA: Diagnosis not present

## 2022-07-15 DIAGNOSIS — M810 Age-related osteoporosis without current pathological fracture: Secondary | ICD-10-CM | POA: Diagnosis not present

## 2022-07-15 DIAGNOSIS — D62 Acute posthemorrhagic anemia: Secondary | ICD-10-CM | POA: Diagnosis not present

## 2022-07-15 DIAGNOSIS — E039 Hypothyroidism, unspecified: Secondary | ICD-10-CM | POA: Diagnosis not present

## 2022-07-15 DIAGNOSIS — S72001D Fracture of unspecified part of neck of right femur, subsequent encounter for closed fracture with routine healing: Secondary | ICD-10-CM | POA: Diagnosis not present

## 2022-07-22 DIAGNOSIS — I10 Essential (primary) hypertension: Secondary | ICD-10-CM | POA: Diagnosis not present

## 2022-07-22 DIAGNOSIS — E785 Hyperlipidemia, unspecified: Secondary | ICD-10-CM | POA: Diagnosis not present

## 2022-07-22 DIAGNOSIS — E039 Hypothyroidism, unspecified: Secondary | ICD-10-CM | POA: Diagnosis not present

## 2022-07-22 DIAGNOSIS — J45909 Unspecified asthma, uncomplicated: Secondary | ICD-10-CM | POA: Diagnosis not present

## 2022-07-22 DIAGNOSIS — D62 Acute posthemorrhagic anemia: Secondary | ICD-10-CM | POA: Diagnosis not present

## 2022-07-22 DIAGNOSIS — N390 Urinary tract infection, site not specified: Secondary | ICD-10-CM | POA: Diagnosis not present

## 2022-07-22 DIAGNOSIS — K219 Gastro-esophageal reflux disease without esophagitis: Secondary | ICD-10-CM | POA: Diagnosis not present

## 2022-07-22 DIAGNOSIS — R638 Other symptoms and signs concerning food and fluid intake: Secondary | ICD-10-CM | POA: Diagnosis not present

## 2022-07-22 DIAGNOSIS — M810 Age-related osteoporosis without current pathological fracture: Secondary | ICD-10-CM | POA: Diagnosis not present

## 2022-07-22 DIAGNOSIS — J309 Allergic rhinitis, unspecified: Secondary | ICD-10-CM | POA: Diagnosis not present

## 2022-07-22 DIAGNOSIS — S72001D Fracture of unspecified part of neck of right femur, subsequent encounter for closed fracture with routine healing: Secondary | ICD-10-CM | POA: Diagnosis not present

## 2022-07-22 DIAGNOSIS — E860 Dehydration: Secondary | ICD-10-CM | POA: Diagnosis not present

## 2022-07-25 DIAGNOSIS — E039 Hypothyroidism, unspecified: Secondary | ICD-10-CM | POA: Diagnosis not present

## 2022-07-25 DIAGNOSIS — M810 Age-related osteoporosis without current pathological fracture: Secondary | ICD-10-CM | POA: Diagnosis not present

## 2022-07-25 DIAGNOSIS — R339 Retention of urine, unspecified: Secondary | ICD-10-CM | POA: Diagnosis not present

## 2022-07-25 DIAGNOSIS — N390 Urinary tract infection, site not specified: Secondary | ICD-10-CM | POA: Diagnosis not present

## 2022-07-25 DIAGNOSIS — J45909 Unspecified asthma, uncomplicated: Secondary | ICD-10-CM | POA: Diagnosis not present

## 2022-07-25 DIAGNOSIS — S72001D Fracture of unspecified part of neck of right femur, subsequent encounter for closed fracture with routine healing: Secondary | ICD-10-CM | POA: Diagnosis not present

## 2022-07-25 DIAGNOSIS — J309 Allergic rhinitis, unspecified: Secondary | ICD-10-CM | POA: Diagnosis not present

## 2022-07-25 DIAGNOSIS — I10 Essential (primary) hypertension: Secondary | ICD-10-CM | POA: Diagnosis not present

## 2022-07-25 DIAGNOSIS — E785 Hyperlipidemia, unspecified: Secondary | ICD-10-CM | POA: Diagnosis not present

## 2022-07-25 DIAGNOSIS — K5900 Constipation, unspecified: Secondary | ICD-10-CM | POA: Diagnosis not present

## 2022-07-25 DIAGNOSIS — H409 Unspecified glaucoma: Secondary | ICD-10-CM | POA: Diagnosis not present

## 2022-07-25 DIAGNOSIS — K219 Gastro-esophageal reflux disease without esophagitis: Secondary | ICD-10-CM | POA: Diagnosis not present

## 2022-07-31 DIAGNOSIS — J45909 Unspecified asthma, uncomplicated: Secondary | ICD-10-CM | POA: Diagnosis not present

## 2022-07-31 DIAGNOSIS — R339 Retention of urine, unspecified: Secondary | ICD-10-CM | POA: Diagnosis not present

## 2022-07-31 DIAGNOSIS — H409 Unspecified glaucoma: Secondary | ICD-10-CM | POA: Diagnosis not present

## 2022-07-31 DIAGNOSIS — K5900 Constipation, unspecified: Secondary | ICD-10-CM | POA: Diagnosis not present

## 2022-07-31 DIAGNOSIS — E785 Hyperlipidemia, unspecified: Secondary | ICD-10-CM | POA: Diagnosis not present

## 2022-07-31 DIAGNOSIS — J309 Allergic rhinitis, unspecified: Secondary | ICD-10-CM | POA: Diagnosis not present

## 2022-07-31 DIAGNOSIS — D62 Acute posthemorrhagic anemia: Secondary | ICD-10-CM | POA: Diagnosis not present

## 2022-07-31 DIAGNOSIS — E039 Hypothyroidism, unspecified: Secondary | ICD-10-CM | POA: Diagnosis not present

## 2022-07-31 DIAGNOSIS — R1310 Dysphagia, unspecified: Secondary | ICD-10-CM | POA: Diagnosis not present

## 2022-07-31 DIAGNOSIS — I10 Essential (primary) hypertension: Secondary | ICD-10-CM | POA: Diagnosis not present

## 2022-07-31 DIAGNOSIS — K219 Gastro-esophageal reflux disease without esophagitis: Secondary | ICD-10-CM | POA: Diagnosis not present

## 2022-07-31 DIAGNOSIS — M810 Age-related osteoporosis without current pathological fracture: Secondary | ICD-10-CM | POA: Diagnosis not present

## 2022-08-07 DIAGNOSIS — K219 Gastro-esophageal reflux disease without esophagitis: Secondary | ICD-10-CM | POA: Diagnosis not present

## 2022-08-07 DIAGNOSIS — J45909 Unspecified asthma, uncomplicated: Secondary | ICD-10-CM | POA: Diagnosis not present

## 2022-08-07 DIAGNOSIS — R339 Retention of urine, unspecified: Secondary | ICD-10-CM | POA: Diagnosis not present

## 2022-08-07 DIAGNOSIS — H409 Unspecified glaucoma: Secondary | ICD-10-CM | POA: Diagnosis not present

## 2022-08-07 DIAGNOSIS — R638 Other symptoms and signs concerning food and fluid intake: Secondary | ICD-10-CM | POA: Diagnosis not present

## 2022-08-07 DIAGNOSIS — I10 Essential (primary) hypertension: Secondary | ICD-10-CM | POA: Diagnosis not present

## 2022-08-07 DIAGNOSIS — M810 Age-related osteoporosis without current pathological fracture: Secondary | ICD-10-CM | POA: Diagnosis not present

## 2022-08-07 DIAGNOSIS — R1310 Dysphagia, unspecified: Secondary | ICD-10-CM | POA: Diagnosis not present

## 2022-08-07 DIAGNOSIS — E039 Hypothyroidism, unspecified: Secondary | ICD-10-CM | POA: Diagnosis not present

## 2022-08-07 DIAGNOSIS — J309 Allergic rhinitis, unspecified: Secondary | ICD-10-CM | POA: Diagnosis not present

## 2022-08-07 DIAGNOSIS — D62 Acute posthemorrhagic anemia: Secondary | ICD-10-CM | POA: Diagnosis not present

## 2022-08-07 DIAGNOSIS — E785 Hyperlipidemia, unspecified: Secondary | ICD-10-CM | POA: Diagnosis not present

## 2022-08-12 DIAGNOSIS — D62 Acute posthemorrhagic anemia: Secondary | ICD-10-CM | POA: Diagnosis not present

## 2022-08-12 DIAGNOSIS — K219 Gastro-esophageal reflux disease without esophagitis: Secondary | ICD-10-CM | POA: Diagnosis not present

## 2022-08-12 DIAGNOSIS — I10 Essential (primary) hypertension: Secondary | ICD-10-CM | POA: Diagnosis not present

## 2022-08-12 DIAGNOSIS — R638 Other symptoms and signs concerning food and fluid intake: Secondary | ICD-10-CM | POA: Diagnosis not present

## 2022-08-12 DIAGNOSIS — H409 Unspecified glaucoma: Secondary | ICD-10-CM | POA: Diagnosis not present

## 2022-08-12 DIAGNOSIS — K5900 Constipation, unspecified: Secondary | ICD-10-CM | POA: Diagnosis not present

## 2022-08-12 DIAGNOSIS — R1310 Dysphagia, unspecified: Secondary | ICD-10-CM | POA: Diagnosis not present

## 2022-08-12 DIAGNOSIS — H547 Unspecified visual loss: Secondary | ICD-10-CM | POA: Diagnosis not present

## 2022-08-12 DIAGNOSIS — R339 Retention of urine, unspecified: Secondary | ICD-10-CM | POA: Diagnosis not present

## 2022-08-12 DIAGNOSIS — E039 Hypothyroidism, unspecified: Secondary | ICD-10-CM | POA: Diagnosis not present

## 2022-08-12 DIAGNOSIS — J45909 Unspecified asthma, uncomplicated: Secondary | ICD-10-CM | POA: Diagnosis not present

## 2022-08-13 ENCOUNTER — Encounter: Payer: Self-pay | Admitting: Gastroenterology

## 2022-08-13 ENCOUNTER — Other Ambulatory Visit (HOSPITAL_COMMUNITY): Payer: Self-pay | Admitting: *Deleted

## 2022-08-13 ENCOUNTER — Ambulatory Visit (INDEPENDENT_AMBULATORY_CARE_PROVIDER_SITE_OTHER): Payer: Medicare Other | Admitting: Gastroenterology

## 2022-08-13 VITALS — BP 110/66 | HR 77 | Ht 64.0 in | Wt 116.0 lb

## 2022-08-13 DIAGNOSIS — R1310 Dysphagia, unspecified: Secondary | ICD-10-CM

## 2022-08-13 DIAGNOSIS — R634 Abnormal weight loss: Secondary | ICD-10-CM

## 2022-08-13 DIAGNOSIS — R059 Cough, unspecified: Secondary | ICD-10-CM

## 2022-08-13 NOTE — Patient Instructions (Signed)
Please continue your Protonix 40 mg twice daily.   Follow up as needed.  You have been scheduled for a Barium Esophogram at [facility] (1st floor of the hospital).  Please arrive 30 minutes prior to your appointment for registration. Make certain not to have anything to eat or drink 3 hours prior to your test. If you need to reschedule for any reason, please contact radiology at [phone] to do so. __________________________________________________________________ A barium swallow is an examination that concentrates on views of the esophagus. This tends to be a double contrast exam (barium and two liquids which, when combined, create a gas to distend the wall of the oesophagus) or single contrast (non-ionic iodine based). The study is usually tailored to your symptoms so a good history is essential. Attention is paid during the study to the form, structure and configuration of the esophagus, looking for functional disorders (such as aspiration, dysphagia, achalasia, motility and reflux) EXAMINATION You may be asked to change into a gown, depending on the type of swallow being performed. A radiologist and radiographer will perform the procedure. The radiologist will advise you of the type of contrast selected for your procedure and direct you during the exam. You will be asked to stand, sit or lie in several different positions and to hold a small amount of fluid in your mouth before being asked to swallow while the imaging is performed .In some instances you may be asked to swallow barium coated marshmallows to assess the motility of a solid food bolus. The exam can be recorded as a digital or video fluoroscopy procedure. POST PROCEDURE It will take 1-2 days for the barium to pass through your system. To facilitate this, it is important, unless otherwise directed, to increase your fluids for the next 24-48hrs and to resume your normal diet.  This test typically takes about 30 minutes to  perform. __________________________________________________________________________________  _______________________________________________________  If your blood pressure at your visit was 140/90 or greater, please contact your primary care physician to follow up on this.  _______________________________________________________  If you are age 64 or older, your body mass index should be between 23-30. Your Body mass index is 19.91 kg/m. If this is out of the aforementioned range listed, please consider follow up with your Primary Care Provider.  If you are age 68 or younger, your body mass index should be between 19-25. Your Body mass index is 19.91 kg/m. If this is out of the aformentioned range listed, please consider follow up with your Primary Care Provider.   ________________________________________________________  The [facility] providers would like to encourage you to use [facility] to communicate with providers for non-urgent requests or questions.  Due to long hold times on the telephone, sending your provider a message by [facility] may be a faster and more efficient way to get a response.  Please allow 48 business hours for a response.  Please remember that this is for non-urgent requests.  _______________________________________________________ It was a pleasure to see you today!  Thank you for trusting me with your gastrointestinal care!

## 2022-08-13 NOTE — Progress Notes (Signed)
Chief Complaint: Dysphagia Primary GI MD: Dr. 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  Last seen in 2021 by Dr. Barron Alvine.  At that time was doing well with dysphagia s/p dilation 02/2019.  On Protonix 40 Mg twice daily.  Has tried going down to once daily but had breakthrough symptoms.  Patient is here today with sister. Recent fall with right hip fx s/p arthroplasty 05/2022. She currently resides at [facility] (rehab and SNF) and she is wheelchair bound. Patient states since residing here she has had trouble eating. States she has no teeth and has difficulty with the food they serve. Unable to masticate well and when she cannot do so, it is difficult for her to swallow. Has no swallowing issues as long as food is chewed up adequately.  She also states she is losing weight because she doesn't want to eat the food served to her. Sister states when at home she was eating without difficulty. But when at [employer] she refuses food often due to "eggs being cold" or not liking what they are serving. She does do 2 boost shakes daily without difficulty. She is working with a dietician there as well. Denies other GI symptoms and doing well on PPI BID.  Appears in April during hospitalization she was 62.1kg and today she is 52.6kg. 136lbs to 116 over the course of 3 months (20lb).   PREVIOUS GI WORKUP  -EGD (03/08/2019, Dr. Barron Alvine): 1 cm stenosis at 35 cm dilated with 19 mm TTS balloon then fractured with forceps, 2 cm HH, benign fundic gland polyps -EGD (01/2019, Dr. Barron Alvine; food impaction): Food bolus in lower one third removed, moderate  intrinsic stenosis at 35 cm dilated with endoscope passage alone with 2 mucosal rents, 2 cm HH, gastric polyps -EGD (03/2012, Dr. Juanda Chance): 2 cm HH, mild gastritis -EGD (02/2008, Dr. Juanda Chance): Normal esophagus, empiric dilation with 16 mm Savary dilator without mucosal rent -EGD (a 02/2006, Dr. Juanda Chance): Normal esophagus, 15 mm gastric polyp -EGD (02/2001, Dr. Juanda Chance): Normal   -UGI series (09/2006): Small hiatal hernia, mild Schatzki's ring, mild esophageal dysmotility noted   Past Medical History:  Diagnosis Date   Allergy    environmental per pt   Arthritis    Asthma    Dementia (HCC)    Depression    Elevated LFTs    Esophageal dysmotility    GERD (gastroesophageal reflux disease)    Glaucoma    Headache(784.0)    Hiatal hernia    Hyperplastic polyps of stomach    Hypertension    Hypothyroid    Irritable bowel syndrome    Macular degeneration    Nausea & vomiting 04/02/2012   Osteoporosis    Shingles    Sinusitis    Tachycardia    Vitamin D deficiency     Past Surgical History:  Procedure Laterality Date   AORTOGRAM  04/16/12   COLONOSCOPY  2008   normal   ESOPHAGOGASTRODUODENOSCOPY  2014   multiple    ESOPHAGOGASTRODUODENOSCOPY Left 01/30/2019   Procedure: ESOPHAGOGASTRODUODENOSCOPY (EGD);  Surgeon: Shellia Cleverly, DO;  Location: [facility];  Service: Gastroenterology;  Laterality: Left;   FOREIGN BODY REMOVAL  01/30/2019   Procedure: FOREIGN BODY REMOVAL;  Surgeon: Doristine Locks  V, DO;  Location: [facility];  Service: Gastroenterology;;   HIP ARTHROPLASTY Right 05/26/2022   Procedure: ARTHROPLASTY BIPOLAR HIP (HEMIARTHROPLASTY);  Surgeon: Samson Frederic, MD;  Location: [facility];  Service: Orthopedics;  Laterality: Right;   MULTIPLE TOOTH EXTRACTIONS     TOTAL ABDOMINAL HYSTERECTOMY  1999   UPPER GASTROINTESTINAL ENDOSCOPY      Current Outpatient Medications  Medication Sig Dispense Refill   acetaminophen (TYLENOL) 500 MG tablet Take 1,000 mg by mouth in  the morning and at bedtime.     atorvastatin (LIPITOR) 20 MG tablet Take 20 mg by mouth at bedtime.     dorzolamide-timolol (COSOPT) 2-0.5 % ophthalmic solution Place 1 drop into the right eye 2 (two) times daily.     fexofenadine (ALLEGRA) 180 MG tablet Take 180 mg by mouth every morning.     fluticasone (FLONASE) 50 MCG/ACT nasal spray Place 2 sprays into the nose daily. 1 g 0   HYDROcodone-acetaminophen (NORCO/VICODIN) 5-325 MG tablet Take 1 tablet by mouth every 4 (four) hours as needed for moderate pain or severe pain. 42 tablet 0   ipratropium (ATROVENT) 0.03 % nasal spray Place 2 sprays into both nostrils daily.     latanoprost (XALATAN) 0.005 % ophthalmic solution Place 1 drop into both eyes at bedtime.     levocetirizine (XYZAL) 5 MG tablet Take 5 mg by mouth every evening.     levothyroxine (SYNTHROID, LEVOTHROID) 88 MCG tablet Take 88 mcg by mouth daily before breakfast. *BRAND NAME ONLY     metoprolol succinate (TOPROL-XL) 50 MG 24 hr tablet TAKE ONE TABLET BY MOUTH ONE TIME DAILY (Patient taking differently: Take 50 mg by mouth daily.) 30 tablet 5   montelukast (SINGULAIR) 10 MG tablet Take 10 mg by mouth at bedtime.     Multiple Vitamins-Minerals (PRESERVISION AREDS 2 PO) Take 1 tablet by mouth 2 (two) times daily.      pantoprazole (PROTONIX) 40 MG tablet TAKE 1 TABLET BY MOUTH 2 TIMES DAILY (Patient taking differently: Take 40 mg by mouth 2 (two) times daily.) 180 tablet 4   sodium chloride (MURO 128) 2 % ophthalmic solution Place 1 drop into both eyes in the morning, at noon, in the evening, and at bedtime.     sodium chloride (MURO 128) 5 % ophthalmic ointment Place 1 Application into both eyes at bedtime.     triamcinolone (NASACORT ALLERGY 24HR) 55 MCG/ACT AERO nasal inhaler Place 2 sprays into the nose daily.     triamcinolone cream (KENALOG) 0.5 % Apply 1 Application topically daily as needed (for irritation on hands).     No current facility-administered medications for this  visit.    Allergies as of 08/13/2022   (No Known Allergies)    Family History  Problem Relation Age of Onset   Hypertension Mother    Heart attack Mother    Heart attack Father 60   Hypertension Father    Peripheral vascular disease Father    Heart failure Sister    Cancer Sister    Hypertension Brother    Cancer Brother    Hypertension Brother    Breast cancer Sister    Ovarian cancer Other        mat cousin   Prostate cancer Maternal Uncle    Colon polyps Sister        siblings   Colon cancer Other        Maternal Aunt x3 Maternal Uncle x 2   Esophageal cancer Brother  died at 61   Kidney disease Brother    Stomach cancer Neg Hx    Rectal cancer Neg Hx     Social History   Socioeconomic History   Marital status: Widowed    Spouse name: Not on file   Number of children: 1   Years of education: Not on file   Highest education level: Not on file  Occupational History   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, civil (consulting)  Tobacco Use   Smoking status: Never   Smokeless tobacco: Never  Vaping Use   Vaping Use: Never used  Substance and Sexual Activity   Alcohol use: No    Alcohol/week: 0.0 standard drinks of alcohol    Comment: <1 day   Drug use: No   Sexual activity: Not on file  Other Topics Concern   Not on file  Social History Narrative   Not on file   Social Determinants of Health   Financial Resource Strain: Not on file  Food Insecurity: No Food Insecurity (05/25/2022)   Hunger Vital Sign    Worried About Running Out of Food in the Last Year: Never true    Ran Out of Food in the Last Year: Never true  Transportation Needs: No Transportation Needs (05/25/2022)   PRAPARE - Administrator, Civil Service (Medical): No    Lack of Transportation (Non-Medical): No  Physical Activity: Not on file  Stress: Not on file  Social Connections: Not on file  Intimate Partner Violence: Not At Risk (05/25/2022)   Humiliation,  Afraid, Rape, and Kick questionnaire    Fear of Current or Ex-Partner: No    Emotionally Abused: No    Physically Abused: No    Sexually Abused: No    Review of Systems:    Constitutional: No weight loss, fever, chills, weakness or fatigue HEENT: Eyes: No change in vision               Ears, Nose, Throat:  No change in hearing or congestion Skin: No rash or itching Cardiovascular: No chest pain, chest pressure or palpitations   Respiratory: No SOB or cough Gastrointestinal: See HPI and otherwise negative Genitourinary: No dysuria or change in urinary frequency Neurological: No headache, dizziness or syncope Musculoskeletal: No new muscle or joint pain Hematologic: No bleeding or bruising Psychiatric: No history of depression or anxiety    Physical Exam:  Vital signs: There were no vitals taken for this visit.  Constitutional: NAD, Well developed, Well nourished, alert and cooperative Head:  Normocephalic and atraumatic. Eyes:   PEERL, EOMI. No icterus. Conjunctiva pink. Respiratory: Respirations even and unlabored. Lungs clear to auscultation bilaterally.   No wheezes, crackles, or rhonchi.  Cardiovascular:  Regular rate and rhythm. No peripheral edema, cyanosis or pallor.  Gastrointestinal:  Soft, nondistended, nontender. No rebound or guarding. Normal bowel sounds. No appreciable masses or hepatomegaly. Rectal:  Not performed.  Msk:  Symmetrical without gross deformities. Without edema, no deformity or joint abnormality.  Neurologic:  Alert and  oriented x4;  grossly normal neurologically.  Skin:   Dry and intact without significant lesions or rashes. Psychiatric: Oriented to person, place and time. Demonstrates good judgement and reason without abnormal affect or behaviors.   RELEVANT LABS AND IMAGING: CBC    Component Value Date/Time   WBC 8.7 05/29/2022 0334   RBC 2.87 (L) 05/29/2022 0334   HGB 9.1 (L) 05/29/2022 0334   HCT 26.6 (L) 05/29/2022 0334   PLT 163  05/29/2022 0334   MCV 92.7 05/29/2022 0334   MCH 31.7 05/29/2022 0334   MCHC 34.2 05/29/2022 0334   RDW 13.7 05/29/2022 0334   LYMPHSABS 1.2 05/25/2022 1126   MONOABS 0.5 05/25/2022 1126   EOSABS 0.4 05/25/2022 1126   BASOSABS 0.1 05/25/2022 1126    CMP     Component Value Date/Time   NA 131 (L) 05/29/2022 0334   K 3.2 (L) 05/29/2022 0334   CL 105 05/29/2022 0334   CO2 22 05/29/2022 0334   GLUCOSE 116 (H) 05/29/2022 0334   BUN 9 05/29/2022 0334   CREATININE 0.68 05/29/2022 0334   CREATININE 1.03 11/13/2012 1305   CALCIUM 7.6 (L) 05/29/2022 0334   PROT 5.1 (L) 05/28/2022 0340   ALBUMIN 2.7 (L) 05/28/2022 0340   AST 71 (H) 05/28/2022 0340   ALT 18 05/28/2022 0340   ALKPHOS 46 05/28/2022 0340   BILITOT 1.0 05/28/2022 0340   GFRNONAA >60 05/29/2022 0334   GFRAA >60 01/30/2019 1446    Assessment: 1. Dysphagia 2. Weight loss - Extensive discussion with patient and sister about reasons for her dysphagia and weight loss. Suspect weight loss is due to decreased caloric intake from not eating as well secondary to being in rehab for her hip. She appears to swallow without difficulty, suspect it is more of a appetite issue rather than true dysphagia as sister states she ate fine at home but is refusing meals while at [facility]. With patient's age and recent endoscopic workup, less inclined to suggest EGD. Patient would also like to avoid invasive procedures.  Plan: 1. Suggest 5-6 small meals per day versus 2-3 larger meals as patient feels she doesn't want to eat much at a time 2. Continue to work with dietician to tailor a diet to her needs and what she desires in order to ensure she is getting adequate calories 3. Continue nutrition shakes 4. Continued issues with mastication due to not having dentures. Recommend working with SLP and to identify chewing problems and if needed may need to work with occupational therapy. Will get MBS as patient as anxiety about her dysphagia. Could help [place]  identify specific textures she struggles with. 5. Continue PPI BID.  Lara Mulch [facility] 08/13/2022, 8:23 AM  Cc: Cleatis Polka., MD

## 2022-08-16 NOTE — Progress Notes (Signed)
Agree with the assessment and plan as outlined by Cira Servant, PA-C.  Agree with plan for more conservative approach in this 89 year old who seems to have multiple potential non-esophageal reasons for her weight loss.  Lenardo Westwood, DO, [facility]

## 2022-08-20 DIAGNOSIS — S72031D Displaced midcervical fracture of right femur, subsequent encounter for closed fracture with routine healing: Secondary | ICD-10-CM | POA: Diagnosis not present

## 2022-08-22 DIAGNOSIS — R339 Retention of urine, unspecified: Secondary | ICD-10-CM | POA: Diagnosis not present

## 2022-08-22 DIAGNOSIS — K5900 Constipation, unspecified: Secondary | ICD-10-CM | POA: Diagnosis not present

## 2022-08-22 DIAGNOSIS — H409 Unspecified glaucoma: Secondary | ICD-10-CM | POA: Diagnosis not present

## 2022-08-22 DIAGNOSIS — D62 Acute posthemorrhagic anemia: Secondary | ICD-10-CM | POA: Diagnosis not present

## 2022-08-22 DIAGNOSIS — H547 Unspecified visual loss: Secondary | ICD-10-CM | POA: Diagnosis not present

## 2022-08-22 DIAGNOSIS — J45909 Unspecified asthma, uncomplicated: Secondary | ICD-10-CM | POA: Diagnosis not present

## 2022-08-22 DIAGNOSIS — I10 Essential (primary) hypertension: Secondary | ICD-10-CM | POA: Diagnosis not present

## 2022-08-22 DIAGNOSIS — R638 Other symptoms and signs concerning food and fluid intake: Secondary | ICD-10-CM | POA: Diagnosis not present

## 2022-08-22 DIAGNOSIS — R1310 Dysphagia, unspecified: Secondary | ICD-10-CM | POA: Diagnosis not present

## 2022-08-26 DIAGNOSIS — R062 Wheezing: Secondary | ICD-10-CM | POA: Diagnosis not present

## 2022-08-26 DIAGNOSIS — J45909 Unspecified asthma, uncomplicated: Secondary | ICD-10-CM | POA: Diagnosis not present

## 2022-08-26 DIAGNOSIS — J309 Allergic rhinitis, unspecified: Secondary | ICD-10-CM | POA: Diagnosis not present

## 2022-08-26 DIAGNOSIS — I10 Essential (primary) hypertension: Secondary | ICD-10-CM | POA: Diagnosis not present

## 2022-08-26 DIAGNOSIS — K5900 Constipation, unspecified: Secondary | ICD-10-CM | POA: Diagnosis not present

## 2022-08-26 DIAGNOSIS — H409 Unspecified glaucoma: Secondary | ICD-10-CM | POA: Diagnosis not present

## 2022-08-26 DIAGNOSIS — R339 Retention of urine, unspecified: Secondary | ICD-10-CM | POA: Diagnosis not present

## 2022-08-26 DIAGNOSIS — R1310 Dysphagia, unspecified: Secondary | ICD-10-CM | POA: Diagnosis not present

## 2022-08-26 DIAGNOSIS — R638 Other symptoms and signs concerning food and fluid intake: Secondary | ICD-10-CM | POA: Diagnosis not present

## 2022-08-26 DIAGNOSIS — R0602 Shortness of breath: Secondary | ICD-10-CM | POA: Diagnosis not present

## 2022-09-02 ENCOUNTER — Ambulatory Visit (HOSPITAL_COMMUNITY)
Admission: RE | Admit: 2022-09-02 | Discharge: 2022-09-02 | Disposition: A | Discharge status: Home / Self Care | Payer: No Typology Code available for payment source | Source: Ambulatory Visit | Attending: Internal Medicine | Admitting: Internal Medicine

## 2022-09-02 DIAGNOSIS — R1314 Dysphagia, pharyngoesophageal phase: Secondary | ICD-10-CM | POA: Diagnosis not present

## 2022-09-02 DIAGNOSIS — R059 Cough, unspecified: Secondary | ICD-10-CM

## 2022-09-02 DIAGNOSIS — R634 Abnormal weight loss: Secondary | ICD-10-CM

## 2022-09-02 DIAGNOSIS — R1310 Dysphagia, unspecified: Secondary | ICD-10-CM

## 2022-09-02 NOTE — Progress Notes (Signed)
Modified Barium Swallow Study  Patient Details  Name: Megan Foster MRN: [id number] Date of Birth: 12/08/1927  Today's Date: 09/02/2022  Modified Barium Swallow completed.  Full report located under Chart Review in the Imaging Section.  History of Present Illness Pt is a 89 year old female, arriving from SNF for an OP MBS. Pt has a history of recurrent esopahgeal stricture (last dilatation in 2020) and mild esophageal dysmotility. She has been in SNF for about 4 months after a hip fx. Typically has to eat soft moist foods, eat slowly to manage esophageal dysphagia. Manages this well and reports problem is stable. She is more limited in food choice now however since she is in SNF and has also lost some weight.   Clinical Impression Pt demonstrates oral and pharyngeal function within normal limits. Pt initiates hyoid excursion as the bolus reaches the posterior suface of the epiglottis and the pyriforms. Barium coats epiglottic mucosa and there are instances of very trace penetration before/during the swallow as a result (PAS 3). No aspiration, no residue. Mastication is intentionally prolonged, pt reports very thorough mastication to avoid globus. Esophageal sweep shows hesitation of pill mid/distal esophagus, clears with large sips of liquid to dilate the esophagus. Would not recommend f/u esophagram as pt is aware of problem, compensates already, pill passed and pt does not want any further dilatations. Pt would benefit from counseling on how to increase her caloric intake in setting of esophageal dysphagia - needs smally frequent meals/snacks high in protein and fat. Cannot eat three large meals a day. Needs soft meats. Defer to primary SLP. Factors that may increase risk of adverse event in presence of aspiration Megan Foster & Megan Foster 2021):    Swallow Evaluation Recommendations Recommendations: PO diet PO Diet Recommendation: Regular;Thin liquids (Level 0) (soft meats - would not further restrict  diet) Medication Administration: Whole meds with liquid Supervision: Patient able to self-feed Swallowing strategies  : Follow solids with liquids Postural changes: Position pt fully upright for meals;Stay upright 30-60 min after meals      Megan Foster, Megan Foster 09/02/2022,1:23 PM

## 2022-09-04 DIAGNOSIS — H18593 Other hereditary corneal dystrophies, bilateral: Secondary | ICD-10-CM | POA: Diagnosis not present

## 2022-09-04 DIAGNOSIS — H353122 Nonexudative age-related macular degeneration, left eye, intermediate dry stage: Secondary | ICD-10-CM | POA: Diagnosis not present

## 2022-09-04 DIAGNOSIS — Z961 Presence of intraocular lens: Secondary | ICD-10-CM | POA: Diagnosis not present

## 2022-09-04 DIAGNOSIS — H401411 Capsular glaucoma with pseudoexfoliation of lens, right eye, mild stage: Secondary | ICD-10-CM | POA: Diagnosis not present

## 2022-09-04 DIAGNOSIS — H1045 Other chronic allergic conjunctivitis: Secondary | ICD-10-CM | POA: Diagnosis not present

## 2022-09-04 DIAGNOSIS — H40012 Open angle with borderline findings, low risk, left eye: Secondary | ICD-10-CM | POA: Diagnosis not present

## 2022-09-06 DIAGNOSIS — R2681 Unsteadiness on feet: Secondary | ICD-10-CM | POA: Diagnosis not present

## 2022-09-06 DIAGNOSIS — M6281 Muscle weakness (generalized): Secondary | ICD-10-CM | POA: Diagnosis not present

## 2022-09-09 DIAGNOSIS — R2681 Unsteadiness on feet: Secondary | ICD-10-CM | POA: Diagnosis not present

## 2022-09-09 DIAGNOSIS — M6281 Muscle weakness (generalized): Secondary | ICD-10-CM | POA: Diagnosis not present

## 2022-09-10 DIAGNOSIS — R2681 Unsteadiness on feet: Secondary | ICD-10-CM | POA: Diagnosis not present

## 2022-09-10 DIAGNOSIS — M6281 Muscle weakness (generalized): Secondary | ICD-10-CM | POA: Diagnosis not present

## 2022-09-11 DIAGNOSIS — J45909 Unspecified asthma, uncomplicated: Secondary | ICD-10-CM | POA: Diagnosis not present

## 2022-09-11 DIAGNOSIS — R2681 Unsteadiness on feet: Secondary | ICD-10-CM | POA: Diagnosis not present

## 2022-09-11 DIAGNOSIS — E785 Hyperlipidemia, unspecified: Secondary | ICD-10-CM | POA: Diagnosis not present

## 2022-09-11 DIAGNOSIS — E039 Hypothyroidism, unspecified: Secondary | ICD-10-CM | POA: Diagnosis not present

## 2022-09-11 DIAGNOSIS — J309 Allergic rhinitis, unspecified: Secondary | ICD-10-CM | POA: Diagnosis not present

## 2022-09-11 DIAGNOSIS — R339 Retention of urine, unspecified: Secondary | ICD-10-CM | POA: Diagnosis not present

## 2022-09-11 DIAGNOSIS — M810 Age-related osteoporosis without current pathological fracture: Secondary | ICD-10-CM | POA: Diagnosis not present

## 2022-09-11 DIAGNOSIS — R638 Other symptoms and signs concerning food and fluid intake: Secondary | ICD-10-CM | POA: Diagnosis not present

## 2022-09-11 DIAGNOSIS — S72001D Fracture of unspecified part of neck of right femur, subsequent encounter for closed fracture with routine healing: Secondary | ICD-10-CM | POA: Diagnosis not present

## 2022-09-11 DIAGNOSIS — I10 Essential (primary) hypertension: Secondary | ICD-10-CM | POA: Diagnosis not present

## 2022-09-11 DIAGNOSIS — K219 Gastro-esophageal reflux disease without esophagitis: Secondary | ICD-10-CM | POA: Diagnosis not present

## 2022-09-11 DIAGNOSIS — M6281 Muscle weakness (generalized): Secondary | ICD-10-CM | POA: Diagnosis not present

## 2022-09-11 DIAGNOSIS — R1310 Dysphagia, unspecified: Secondary | ICD-10-CM | POA: Diagnosis not present

## 2022-09-11 DIAGNOSIS — H409 Unspecified glaucoma: Secondary | ICD-10-CM | POA: Diagnosis not present

## 2022-09-12 DIAGNOSIS — R41841 Cognitive communication deficit: Secondary | ICD-10-CM | POA: Diagnosis not present

## 2022-09-12 DIAGNOSIS — R2681 Unsteadiness on feet: Secondary | ICD-10-CM | POA: Diagnosis not present

## 2022-09-12 DIAGNOSIS — S72001D Fracture of unspecified part of neck of right femur, subsequent encounter for closed fracture with routine healing: Secondary | ICD-10-CM | POA: Diagnosis not present

## 2022-09-12 DIAGNOSIS — M6281 Muscle weakness (generalized): Secondary | ICD-10-CM | POA: Diagnosis not present

## 2022-09-12 DIAGNOSIS — R1314 Dysphagia, pharyngoesophageal phase: Secondary | ICD-10-CM | POA: Diagnosis not present

## 2022-09-13 DIAGNOSIS — M6281 Muscle weakness (generalized): Secondary | ICD-10-CM | POA: Diagnosis not present

## 2022-09-13 DIAGNOSIS — R41841 Cognitive communication deficit: Secondary | ICD-10-CM | POA: Diagnosis not present

## 2022-09-13 DIAGNOSIS — S72001D Fracture of unspecified part of neck of right femur, subsequent encounter for closed fracture with routine healing: Secondary | ICD-10-CM | POA: Diagnosis not present

## 2022-09-13 DIAGNOSIS — R2681 Unsteadiness on feet: Secondary | ICD-10-CM | POA: Diagnosis not present

## 2022-09-13 DIAGNOSIS — R1314 Dysphagia, pharyngoesophageal phase: Secondary | ICD-10-CM | POA: Diagnosis not present

## 2022-09-15 DIAGNOSIS — R41841 Cognitive communication deficit: Secondary | ICD-10-CM | POA: Diagnosis not present

## 2022-09-15 DIAGNOSIS — R1314 Dysphagia, pharyngoesophageal phase: Secondary | ICD-10-CM | POA: Diagnosis not present

## 2022-09-15 DIAGNOSIS — S72001D Fracture of unspecified part of neck of right femur, subsequent encounter for closed fracture with routine healing: Secondary | ICD-10-CM | POA: Diagnosis not present

## 2022-09-15 DIAGNOSIS — M6281 Muscle weakness (generalized): Secondary | ICD-10-CM | POA: Diagnosis not present

## 2022-09-15 DIAGNOSIS — R2681 Unsteadiness on feet: Secondary | ICD-10-CM | POA: Diagnosis not present

## 2022-09-16 DIAGNOSIS — R1314 Dysphagia, pharyngoesophageal phase: Secondary | ICD-10-CM | POA: Diagnosis not present

## 2022-09-16 DIAGNOSIS — R41841 Cognitive communication deficit: Secondary | ICD-10-CM | POA: Diagnosis not present

## 2022-09-16 DIAGNOSIS — M6281 Muscle weakness (generalized): Secondary | ICD-10-CM | POA: Diagnosis not present

## 2022-09-16 DIAGNOSIS — S72001D Fracture of unspecified part of neck of right femur, subsequent encounter for closed fracture with routine healing: Secondary | ICD-10-CM | POA: Diagnosis not present

## 2022-09-16 DIAGNOSIS — R2681 Unsteadiness on feet: Secondary | ICD-10-CM | POA: Diagnosis not present

## 2022-09-17 DIAGNOSIS — M6281 Muscle weakness (generalized): Secondary | ICD-10-CM | POA: Diagnosis not present

## 2022-09-17 DIAGNOSIS — I10 Essential (primary) hypertension: Secondary | ICD-10-CM | POA: Diagnosis not present

## 2022-09-17 DIAGNOSIS — S72001D Fracture of unspecified part of neck of right femur, subsequent encounter for closed fracture with routine healing: Secondary | ICD-10-CM | POA: Diagnosis not present

## 2022-09-17 DIAGNOSIS — R1314 Dysphagia, pharyngoesophageal phase: Secondary | ICD-10-CM | POA: Diagnosis not present

## 2022-09-17 DIAGNOSIS — R41841 Cognitive communication deficit: Secondary | ICD-10-CM | POA: Diagnosis not present

## 2022-09-17 DIAGNOSIS — D649 Anemia, unspecified: Secondary | ICD-10-CM | POA: Diagnosis not present

## 2022-09-17 DIAGNOSIS — R2681 Unsteadiness on feet: Secondary | ICD-10-CM | POA: Diagnosis not present

## 2022-09-18 DIAGNOSIS — M6281 Muscle weakness (generalized): Secondary | ICD-10-CM | POA: Diagnosis not present

## 2022-09-18 DIAGNOSIS — S72001D Fracture of unspecified part of neck of right femur, subsequent encounter for closed fracture with routine healing: Secondary | ICD-10-CM | POA: Diagnosis not present

## 2022-09-18 DIAGNOSIS — R41841 Cognitive communication deficit: Secondary | ICD-10-CM | POA: Diagnosis not present

## 2022-09-18 DIAGNOSIS — R2681 Unsteadiness on feet: Secondary | ICD-10-CM | POA: Diagnosis not present

## 2022-09-18 DIAGNOSIS — R1314 Dysphagia, pharyngoesophageal phase: Secondary | ICD-10-CM | POA: Diagnosis not present

## 2022-09-19 DIAGNOSIS — K5900 Constipation, unspecified: Secondary | ICD-10-CM | POA: Diagnosis not present

## 2022-09-19 DIAGNOSIS — M810 Age-related osteoporosis without current pathological fracture: Secondary | ICD-10-CM | POA: Diagnosis not present

## 2022-09-19 DIAGNOSIS — M6281 Muscle weakness (generalized): Secondary | ICD-10-CM | POA: Diagnosis not present

## 2022-09-19 DIAGNOSIS — J45909 Unspecified asthma, uncomplicated: Secondary | ICD-10-CM | POA: Diagnosis not present

## 2022-09-19 DIAGNOSIS — R1314 Dysphagia, pharyngoesophageal phase: Secondary | ICD-10-CM | POA: Diagnosis not present

## 2022-09-19 DIAGNOSIS — R41841 Cognitive communication deficit: Secondary | ICD-10-CM | POA: Diagnosis not present

## 2022-09-19 DIAGNOSIS — J309 Allergic rhinitis, unspecified: Secondary | ICD-10-CM | POA: Diagnosis not present

## 2022-09-19 DIAGNOSIS — R339 Retention of urine, unspecified: Secondary | ICD-10-CM | POA: Diagnosis not present

## 2022-09-19 DIAGNOSIS — S72001D Fracture of unspecified part of neck of right femur, subsequent encounter for closed fracture with routine healing: Secondary | ICD-10-CM | POA: Diagnosis not present

## 2022-09-19 DIAGNOSIS — R1310 Dysphagia, unspecified: Secondary | ICD-10-CM | POA: Diagnosis not present

## 2022-09-19 DIAGNOSIS — K219 Gastro-esophageal reflux disease without esophagitis: Secondary | ICD-10-CM | POA: Diagnosis not present

## 2022-09-19 DIAGNOSIS — E039 Hypothyroidism, unspecified: Secondary | ICD-10-CM | POA: Diagnosis not present

## 2022-09-19 DIAGNOSIS — R2681 Unsteadiness on feet: Secondary | ICD-10-CM | POA: Diagnosis not present

## 2022-09-19 DIAGNOSIS — H409 Unspecified glaucoma: Secondary | ICD-10-CM | POA: Diagnosis not present

## 2022-09-19 DIAGNOSIS — I10 Essential (primary) hypertension: Secondary | ICD-10-CM | POA: Diagnosis not present

## 2022-09-19 DIAGNOSIS — R638 Other symptoms and signs concerning food and fluid intake: Secondary | ICD-10-CM | POA: Diagnosis not present

## 2022-09-20 DIAGNOSIS — R41841 Cognitive communication deficit: Secondary | ICD-10-CM | POA: Diagnosis not present

## 2022-09-20 DIAGNOSIS — R2681 Unsteadiness on feet: Secondary | ICD-10-CM | POA: Diagnosis not present

## 2022-09-20 DIAGNOSIS — S72001D Fracture of unspecified part of neck of right femur, subsequent encounter for closed fracture with routine healing: Secondary | ICD-10-CM | POA: Diagnosis not present

## 2022-09-20 DIAGNOSIS — N390 Urinary tract infection, site not specified: Secondary | ICD-10-CM | POA: Diagnosis not present

## 2022-09-20 DIAGNOSIS — M6281 Muscle weakness (generalized): Secondary | ICD-10-CM | POA: Diagnosis not present

## 2022-09-20 DIAGNOSIS — R1314 Dysphagia, pharyngoesophageal phase: Secondary | ICD-10-CM | POA: Diagnosis not present

## 2022-09-23 DIAGNOSIS — R1314 Dysphagia, pharyngoesophageal phase: Secondary | ICD-10-CM | POA: Diagnosis not present

## 2022-09-23 DIAGNOSIS — R2681 Unsteadiness on feet: Secondary | ICD-10-CM | POA: Diagnosis not present

## 2022-09-23 DIAGNOSIS — R41841 Cognitive communication deficit: Secondary | ICD-10-CM | POA: Diagnosis not present

## 2022-09-23 DIAGNOSIS — M6281 Muscle weakness (generalized): Secondary | ICD-10-CM | POA: Diagnosis not present

## 2022-09-23 DIAGNOSIS — S72001D Fracture of unspecified part of neck of right femur, subsequent encounter for closed fracture with routine healing: Secondary | ICD-10-CM | POA: Diagnosis not present

## 2022-09-24 DIAGNOSIS — S72001D Fracture of unspecified part of neck of right femur, subsequent encounter for closed fracture with routine healing: Secondary | ICD-10-CM | POA: Diagnosis not present

## 2022-09-24 DIAGNOSIS — R1314 Dysphagia, pharyngoesophageal phase: Secondary | ICD-10-CM | POA: Diagnosis not present

## 2022-09-24 DIAGNOSIS — R2681 Unsteadiness on feet: Secondary | ICD-10-CM | POA: Diagnosis not present

## 2022-09-24 DIAGNOSIS — R41841 Cognitive communication deficit: Secondary | ICD-10-CM | POA: Diagnosis not present

## 2022-09-24 DIAGNOSIS — M6281 Muscle weakness (generalized): Secondary | ICD-10-CM | POA: Diagnosis not present

## 2022-09-25 DIAGNOSIS — E785 Hyperlipidemia, unspecified: Secondary | ICD-10-CM | POA: Diagnosis not present

## 2022-09-25 DIAGNOSIS — M810 Age-related osteoporosis without current pathological fracture: Secondary | ICD-10-CM | POA: Diagnosis not present

## 2022-09-25 DIAGNOSIS — R638 Other symptoms and signs concerning food and fluid intake: Secondary | ICD-10-CM | POA: Diagnosis not present

## 2022-09-25 DIAGNOSIS — R339 Retention of urine, unspecified: Secondary | ICD-10-CM | POA: Diagnosis not present

## 2022-09-25 DIAGNOSIS — R41841 Cognitive communication deficit: Secondary | ICD-10-CM | POA: Diagnosis not present

## 2022-09-25 DIAGNOSIS — K5900 Constipation, unspecified: Secondary | ICD-10-CM | POA: Diagnosis not present

## 2022-09-25 DIAGNOSIS — K219 Gastro-esophageal reflux disease without esophagitis: Secondary | ICD-10-CM | POA: Diagnosis not present

## 2022-09-25 DIAGNOSIS — S72001D Fracture of unspecified part of neck of right femur, subsequent encounter for closed fracture with routine healing: Secondary | ICD-10-CM | POA: Diagnosis not present

## 2022-09-25 DIAGNOSIS — R1310 Dysphagia, unspecified: Secondary | ICD-10-CM | POA: Diagnosis not present

## 2022-09-25 DIAGNOSIS — R059 Cough, unspecified: Secondary | ICD-10-CM | POA: Diagnosis not present

## 2022-09-25 DIAGNOSIS — H409 Unspecified glaucoma: Secondary | ICD-10-CM | POA: Diagnosis not present

## 2022-09-25 DIAGNOSIS — E039 Hypothyroidism, unspecified: Secondary | ICD-10-CM | POA: Diagnosis not present

## 2022-09-25 DIAGNOSIS — R1314 Dysphagia, pharyngoesophageal phase: Secondary | ICD-10-CM | POA: Diagnosis not present

## 2022-09-25 DIAGNOSIS — M6281 Muscle weakness (generalized): Secondary | ICD-10-CM | POA: Diagnosis not present

## 2022-09-25 DIAGNOSIS — J45909 Unspecified asthma, uncomplicated: Secondary | ICD-10-CM | POA: Diagnosis not present

## 2022-09-25 DIAGNOSIS — I10 Essential (primary) hypertension: Secondary | ICD-10-CM | POA: Diagnosis not present

## 2022-09-25 DIAGNOSIS — R2681 Unsteadiness on feet: Secondary | ICD-10-CM | POA: Diagnosis not present

## 2022-09-26 DIAGNOSIS — S72001D Fracture of unspecified part of neck of right femur, subsequent encounter for closed fracture with routine healing: Secondary | ICD-10-CM | POA: Diagnosis not present

## 2022-09-26 DIAGNOSIS — R2681 Unsteadiness on feet: Secondary | ICD-10-CM | POA: Diagnosis not present

## 2022-09-26 DIAGNOSIS — M6281 Muscle weakness (generalized): Secondary | ICD-10-CM | POA: Diagnosis not present

## 2022-09-26 DIAGNOSIS — R41841 Cognitive communication deficit: Secondary | ICD-10-CM | POA: Diagnosis not present

## 2022-09-26 DIAGNOSIS — R1314 Dysphagia, pharyngoesophageal phase: Secondary | ICD-10-CM | POA: Diagnosis not present

## 2022-09-29 DIAGNOSIS — R1314 Dysphagia, pharyngoesophageal phase: Secondary | ICD-10-CM | POA: Diagnosis not present

## 2022-09-29 DIAGNOSIS — R41841 Cognitive communication deficit: Secondary | ICD-10-CM | POA: Diagnosis not present

## 2022-09-29 DIAGNOSIS — M6281 Muscle weakness (generalized): Secondary | ICD-10-CM | POA: Diagnosis not present

## 2022-09-29 DIAGNOSIS — R2681 Unsteadiness on feet: Secondary | ICD-10-CM | POA: Diagnosis not present

## 2022-09-29 DIAGNOSIS — S72001D Fracture of unspecified part of neck of right femur, subsequent encounter for closed fracture with routine healing: Secondary | ICD-10-CM | POA: Diagnosis not present

## 2022-09-30 DIAGNOSIS — K219 Gastro-esophageal reflux disease without esophagitis: Secondary | ICD-10-CM | POA: Diagnosis not present

## 2022-09-30 DIAGNOSIS — R638 Other symptoms and signs concerning food and fluid intake: Secondary | ICD-10-CM | POA: Diagnosis not present

## 2022-09-30 DIAGNOSIS — E039 Hypothyroidism, unspecified: Secondary | ICD-10-CM | POA: Diagnosis not present

## 2022-09-30 DIAGNOSIS — R41841 Cognitive communication deficit: Secondary | ICD-10-CM | POA: Diagnosis not present

## 2022-09-30 DIAGNOSIS — H409 Unspecified glaucoma: Secondary | ICD-10-CM | POA: Diagnosis not present

## 2022-09-30 DIAGNOSIS — N3281 Overactive bladder: Secondary | ICD-10-CM | POA: Diagnosis not present

## 2022-09-30 DIAGNOSIS — K5900 Constipation, unspecified: Secondary | ICD-10-CM | POA: Diagnosis not present

## 2022-09-30 DIAGNOSIS — R339 Retention of urine, unspecified: Secondary | ICD-10-CM | POA: Diagnosis not present

## 2022-09-30 DIAGNOSIS — E785 Hyperlipidemia, unspecified: Secondary | ICD-10-CM | POA: Diagnosis not present

## 2022-09-30 DIAGNOSIS — J45909 Unspecified asthma, uncomplicated: Secondary | ICD-10-CM | POA: Diagnosis not present

## 2022-09-30 DIAGNOSIS — R1314 Dysphagia, pharyngoesophageal phase: Secondary | ICD-10-CM | POA: Diagnosis not present

## 2022-09-30 DIAGNOSIS — M810 Age-related osteoporosis without current pathological fracture: Secondary | ICD-10-CM | POA: Diagnosis not present

## 2022-09-30 DIAGNOSIS — M6281 Muscle weakness (generalized): Secondary | ICD-10-CM | POA: Diagnosis not present

## 2022-09-30 DIAGNOSIS — R2681 Unsteadiness on feet: Secondary | ICD-10-CM | POA: Diagnosis not present

## 2022-09-30 DIAGNOSIS — S72001D Fracture of unspecified part of neck of right femur, subsequent encounter for closed fracture with routine healing: Secondary | ICD-10-CM | POA: Diagnosis not present

## 2022-09-30 DIAGNOSIS — R1310 Dysphagia, unspecified: Secondary | ICD-10-CM | POA: Diagnosis not present

## 2022-10-01 DIAGNOSIS — M6281 Muscle weakness (generalized): Secondary | ICD-10-CM | POA: Diagnosis not present

## 2022-10-01 DIAGNOSIS — R2681 Unsteadiness on feet: Secondary | ICD-10-CM | POA: Diagnosis not present

## 2022-10-01 DIAGNOSIS — R1314 Dysphagia, pharyngoesophageal phase: Secondary | ICD-10-CM | POA: Diagnosis not present

## 2022-10-01 DIAGNOSIS — R41841 Cognitive communication deficit: Secondary | ICD-10-CM | POA: Diagnosis not present

## 2022-10-01 DIAGNOSIS — S72001D Fracture of unspecified part of neck of right femur, subsequent encounter for closed fracture with routine healing: Secondary | ICD-10-CM | POA: Diagnosis not present

## 2022-10-02 DIAGNOSIS — R2681 Unsteadiness on feet: Secondary | ICD-10-CM | POA: Diagnosis not present

## 2022-10-02 DIAGNOSIS — R41841 Cognitive communication deficit: Secondary | ICD-10-CM | POA: Diagnosis not present

## 2022-10-02 DIAGNOSIS — R1314 Dysphagia, pharyngoesophageal phase: Secondary | ICD-10-CM | POA: Diagnosis not present

## 2022-10-02 DIAGNOSIS — M6281 Muscle weakness (generalized): Secondary | ICD-10-CM | POA: Diagnosis not present

## 2022-10-02 DIAGNOSIS — S72001D Fracture of unspecified part of neck of right femur, subsequent encounter for closed fracture with routine healing: Secondary | ICD-10-CM | POA: Diagnosis not present

## 2022-10-03 DIAGNOSIS — R2681 Unsteadiness on feet: Secondary | ICD-10-CM | POA: Diagnosis not present

## 2022-10-03 DIAGNOSIS — R1314 Dysphagia, pharyngoesophageal phase: Secondary | ICD-10-CM | POA: Diagnosis not present

## 2022-10-03 DIAGNOSIS — M6281 Muscle weakness (generalized): Secondary | ICD-10-CM | POA: Diagnosis not present

## 2022-10-03 DIAGNOSIS — S72001D Fracture of unspecified part of neck of right femur, subsequent encounter for closed fracture with routine healing: Secondary | ICD-10-CM | POA: Diagnosis not present

## 2022-10-03 DIAGNOSIS — R41841 Cognitive communication deficit: Secondary | ICD-10-CM | POA: Diagnosis not present

## 2022-10-04 DIAGNOSIS — S72001D Fracture of unspecified part of neck of right femur, subsequent encounter for closed fracture with routine healing: Secondary | ICD-10-CM | POA: Diagnosis not present

## 2022-10-04 DIAGNOSIS — R41841 Cognitive communication deficit: Secondary | ICD-10-CM | POA: Diagnosis not present

## 2022-10-04 DIAGNOSIS — R1314 Dysphagia, pharyngoesophageal phase: Secondary | ICD-10-CM | POA: Diagnosis not present

## 2022-10-04 DIAGNOSIS — M6281 Muscle weakness (generalized): Secondary | ICD-10-CM | POA: Diagnosis not present

## 2022-10-04 DIAGNOSIS — R2681 Unsteadiness on feet: Secondary | ICD-10-CM | POA: Diagnosis not present

## 2022-10-07 DIAGNOSIS — S72001D Fracture of unspecified part of neck of right femur, subsequent encounter for closed fracture with routine healing: Secondary | ICD-10-CM | POA: Diagnosis not present

## 2022-10-07 DIAGNOSIS — R1314 Dysphagia, pharyngoesophageal phase: Secondary | ICD-10-CM | POA: Diagnosis not present

## 2022-10-07 DIAGNOSIS — R41841 Cognitive communication deficit: Secondary | ICD-10-CM | POA: Diagnosis not present

## 2022-10-07 DIAGNOSIS — R2681 Unsteadiness on feet: Secondary | ICD-10-CM | POA: Diagnosis not present

## 2022-10-07 DIAGNOSIS — M6281 Muscle weakness (generalized): Secondary | ICD-10-CM | POA: Diagnosis not present

## 2022-10-08 DIAGNOSIS — S72001D Fracture of unspecified part of neck of right femur, subsequent encounter for closed fracture with routine healing: Secondary | ICD-10-CM | POA: Diagnosis not present

## 2022-10-08 DIAGNOSIS — R2681 Unsteadiness on feet: Secondary | ICD-10-CM | POA: Diagnosis not present

## 2022-10-08 DIAGNOSIS — R41841 Cognitive communication deficit: Secondary | ICD-10-CM | POA: Diagnosis not present

## 2022-10-08 DIAGNOSIS — M6281 Muscle weakness (generalized): Secondary | ICD-10-CM | POA: Diagnosis not present

## 2022-10-08 DIAGNOSIS — R1314 Dysphagia, pharyngoesophageal phase: Secondary | ICD-10-CM | POA: Diagnosis not present

## 2022-10-09 DIAGNOSIS — S72001D Fracture of unspecified part of neck of right femur, subsequent encounter for closed fracture with routine healing: Secondary | ICD-10-CM | POA: Diagnosis not present

## 2022-10-09 DIAGNOSIS — R41841 Cognitive communication deficit: Secondary | ICD-10-CM | POA: Diagnosis not present

## 2022-10-09 DIAGNOSIS — M6281 Muscle weakness (generalized): Secondary | ICD-10-CM | POA: Diagnosis not present

## 2022-10-09 DIAGNOSIS — R2681 Unsteadiness on feet: Secondary | ICD-10-CM | POA: Diagnosis not present

## 2022-10-09 DIAGNOSIS — R1314 Dysphagia, pharyngoesophageal phase: Secondary | ICD-10-CM | POA: Diagnosis not present

## 2022-10-10 DIAGNOSIS — M6281 Muscle weakness (generalized): Secondary | ICD-10-CM | POA: Diagnosis not present

## 2022-10-10 DIAGNOSIS — R41841 Cognitive communication deficit: Secondary | ICD-10-CM | POA: Diagnosis not present

## 2022-10-10 DIAGNOSIS — R1314 Dysphagia, pharyngoesophageal phase: Secondary | ICD-10-CM | POA: Diagnosis not present

## 2022-10-10 DIAGNOSIS — S72001D Fracture of unspecified part of neck of right femur, subsequent encounter for closed fracture with routine healing: Secondary | ICD-10-CM | POA: Diagnosis not present

## 2022-10-10 DIAGNOSIS — R2681 Unsteadiness on feet: Secondary | ICD-10-CM | POA: Diagnosis not present

## 2022-10-11 DIAGNOSIS — R1314 Dysphagia, pharyngoesophageal phase: Secondary | ICD-10-CM | POA: Diagnosis not present

## 2022-10-11 DIAGNOSIS — R2681 Unsteadiness on feet: Secondary | ICD-10-CM | POA: Diagnosis not present

## 2022-10-11 DIAGNOSIS — S72001D Fracture of unspecified part of neck of right femur, subsequent encounter for closed fracture with routine healing: Secondary | ICD-10-CM | POA: Diagnosis not present

## 2022-10-11 DIAGNOSIS — M6281 Muscle weakness (generalized): Secondary | ICD-10-CM | POA: Diagnosis not present

## 2022-10-11 DIAGNOSIS — R41841 Cognitive communication deficit: Secondary | ICD-10-CM | POA: Diagnosis not present

## 2022-10-12 DIAGNOSIS — M6281 Muscle weakness (generalized): Secondary | ICD-10-CM | POA: Diagnosis not present

## 2022-10-12 DIAGNOSIS — S72001D Fracture of unspecified part of neck of right femur, subsequent encounter for closed fracture with routine healing: Secondary | ICD-10-CM | POA: Diagnosis not present

## 2022-10-12 DIAGNOSIS — R41841 Cognitive communication deficit: Secondary | ICD-10-CM | POA: Diagnosis not present

## 2022-10-12 DIAGNOSIS — R2681 Unsteadiness on feet: Secondary | ICD-10-CM | POA: Diagnosis not present

## 2022-10-12 DIAGNOSIS — R1314 Dysphagia, pharyngoesophageal phase: Secondary | ICD-10-CM | POA: Diagnosis not present

## 2022-10-13 DIAGNOSIS — R1314 Dysphagia, pharyngoesophageal phase: Secondary | ICD-10-CM | POA: Diagnosis not present

## 2022-10-13 DIAGNOSIS — R2681 Unsteadiness on feet: Secondary | ICD-10-CM | POA: Diagnosis not present

## 2022-10-13 DIAGNOSIS — R41841 Cognitive communication deficit: Secondary | ICD-10-CM | POA: Diagnosis not present

## 2022-10-13 DIAGNOSIS — M6281 Muscle weakness (generalized): Secondary | ICD-10-CM | POA: Diagnosis not present

## 2022-10-13 DIAGNOSIS — S72001D Fracture of unspecified part of neck of right femur, subsequent encounter for closed fracture with routine healing: Secondary | ICD-10-CM | POA: Diagnosis not present

## 2022-10-14 DIAGNOSIS — R41841 Cognitive communication deficit: Secondary | ICD-10-CM | POA: Diagnosis not present

## 2022-10-14 DIAGNOSIS — R1314 Dysphagia, pharyngoesophageal phase: Secondary | ICD-10-CM | POA: Diagnosis not present

## 2022-10-14 DIAGNOSIS — R2681 Unsteadiness on feet: Secondary | ICD-10-CM | POA: Diagnosis not present

## 2022-10-14 DIAGNOSIS — S72001D Fracture of unspecified part of neck of right femur, subsequent encounter for closed fracture with routine healing: Secondary | ICD-10-CM | POA: Diagnosis not present

## 2022-10-14 DIAGNOSIS — M6281 Muscle weakness (generalized): Secondary | ICD-10-CM | POA: Diagnosis not present

## 2022-10-15 DIAGNOSIS — R41841 Cognitive communication deficit: Secondary | ICD-10-CM | POA: Diagnosis not present

## 2022-10-15 DIAGNOSIS — R2681 Unsteadiness on feet: Secondary | ICD-10-CM | POA: Diagnosis not present

## 2022-10-15 DIAGNOSIS — M6281 Muscle weakness (generalized): Secondary | ICD-10-CM | POA: Diagnosis not present

## 2022-10-15 DIAGNOSIS — R1314 Dysphagia, pharyngoesophageal phase: Secondary | ICD-10-CM | POA: Diagnosis not present

## 2022-10-15 DIAGNOSIS — S72001D Fracture of unspecified part of neck of right femur, subsequent encounter for closed fracture with routine healing: Secondary | ICD-10-CM | POA: Diagnosis not present

## 2022-10-16 DIAGNOSIS — R41841 Cognitive communication deficit: Secondary | ICD-10-CM | POA: Diagnosis not present

## 2022-10-16 DIAGNOSIS — R1314 Dysphagia, pharyngoesophageal phase: Secondary | ICD-10-CM | POA: Diagnosis not present

## 2022-10-16 DIAGNOSIS — R2681 Unsteadiness on feet: Secondary | ICD-10-CM | POA: Diagnosis not present

## 2022-10-16 DIAGNOSIS — S72001D Fracture of unspecified part of neck of right femur, subsequent encounter for closed fracture with routine healing: Secondary | ICD-10-CM | POA: Diagnosis not present

## 2022-10-16 DIAGNOSIS — M6281 Muscle weakness (generalized): Secondary | ICD-10-CM | POA: Diagnosis not present

## 2022-10-17 DIAGNOSIS — D649 Anemia, unspecified: Secondary | ICD-10-CM | POA: Diagnosis not present

## 2022-10-17 DIAGNOSIS — M810 Age-related osteoporosis without current pathological fracture: Secondary | ICD-10-CM | POA: Diagnosis not present

## 2022-10-17 DIAGNOSIS — K5900 Constipation, unspecified: Secondary | ICD-10-CM | POA: Diagnosis not present

## 2022-10-17 DIAGNOSIS — M6281 Muscle weakness (generalized): Secondary | ICD-10-CM | POA: Diagnosis not present

## 2022-10-17 DIAGNOSIS — I10 Essential (primary) hypertension: Secondary | ICD-10-CM | POA: Diagnosis not present

## 2022-10-17 DIAGNOSIS — R2681 Unsteadiness on feet: Secondary | ICD-10-CM | POA: Diagnosis not present

## 2022-10-17 DIAGNOSIS — R41841 Cognitive communication deficit: Secondary | ICD-10-CM | POA: Diagnosis not present

## 2022-10-17 DIAGNOSIS — K219 Gastro-esophageal reflux disease without esophagitis: Secondary | ICD-10-CM | POA: Diagnosis not present

## 2022-10-17 DIAGNOSIS — E785 Hyperlipidemia, unspecified: Secondary | ICD-10-CM | POA: Diagnosis not present

## 2022-10-17 DIAGNOSIS — R1314 Dysphagia, pharyngoesophageal phase: Secondary | ICD-10-CM | POA: Diagnosis not present

## 2022-10-17 DIAGNOSIS — N3281 Overactive bladder: Secondary | ICD-10-CM | POA: Diagnosis not present

## 2022-10-17 DIAGNOSIS — H409 Unspecified glaucoma: Secondary | ICD-10-CM | POA: Diagnosis not present

## 2022-10-17 DIAGNOSIS — R1310 Dysphagia, unspecified: Secondary | ICD-10-CM | POA: Diagnosis not present

## 2022-10-17 DIAGNOSIS — R638 Other symptoms and signs concerning food and fluid intake: Secondary | ICD-10-CM | POA: Diagnosis not present

## 2022-10-17 DIAGNOSIS — J45909 Unspecified asthma, uncomplicated: Secondary | ICD-10-CM | POA: Diagnosis not present

## 2022-10-17 DIAGNOSIS — S72001D Fracture of unspecified part of neck of right femur, subsequent encounter for closed fracture with routine healing: Secondary | ICD-10-CM | POA: Diagnosis not present

## 2022-10-17 DIAGNOSIS — R339 Retention of urine, unspecified: Secondary | ICD-10-CM | POA: Diagnosis not present

## 2022-10-17 DIAGNOSIS — E039 Hypothyroidism, unspecified: Secondary | ICD-10-CM | POA: Diagnosis not present

## 2022-10-18 DIAGNOSIS — R41841 Cognitive communication deficit: Secondary | ICD-10-CM | POA: Diagnosis not present

## 2022-10-18 DIAGNOSIS — S72001D Fracture of unspecified part of neck of right femur, subsequent encounter for closed fracture with routine healing: Secondary | ICD-10-CM | POA: Diagnosis not present

## 2022-10-18 DIAGNOSIS — R1314 Dysphagia, pharyngoesophageal phase: Secondary | ICD-10-CM | POA: Diagnosis not present

## 2022-10-18 DIAGNOSIS — R2681 Unsteadiness on feet: Secondary | ICD-10-CM | POA: Diagnosis not present

## 2022-10-18 DIAGNOSIS — M6281 Muscle weakness (generalized): Secondary | ICD-10-CM | POA: Diagnosis not present

## 2022-10-19 DIAGNOSIS — U071 COVID-19: Secondary | ICD-10-CM | POA: Diagnosis not present

## 2022-10-21 DIAGNOSIS — M6281 Muscle weakness (generalized): Secondary | ICD-10-CM | POA: Diagnosis not present

## 2022-10-21 DIAGNOSIS — S72001D Fracture of unspecified part of neck of right femur, subsequent encounter for closed fracture with routine healing: Secondary | ICD-10-CM | POA: Diagnosis not present

## 2022-10-21 DIAGNOSIS — R41841 Cognitive communication deficit: Secondary | ICD-10-CM | POA: Diagnosis not present

## 2022-10-21 DIAGNOSIS — R1314 Dysphagia, pharyngoesophageal phase: Secondary | ICD-10-CM | POA: Diagnosis not present

## 2022-10-21 DIAGNOSIS — R2681 Unsteadiness on feet: Secondary | ICD-10-CM | POA: Diagnosis not present

## 2022-10-22 DIAGNOSIS — R1314 Dysphagia, pharyngoesophageal phase: Secondary | ICD-10-CM | POA: Diagnosis not present

## 2022-10-22 DIAGNOSIS — S72001D Fracture of unspecified part of neck of right femur, subsequent encounter for closed fracture with routine healing: Secondary | ICD-10-CM | POA: Diagnosis not present

## 2022-10-22 DIAGNOSIS — R2681 Unsteadiness on feet: Secondary | ICD-10-CM | POA: Diagnosis not present

## 2022-10-22 DIAGNOSIS — R41841 Cognitive communication deficit: Secondary | ICD-10-CM | POA: Diagnosis not present

## 2022-10-22 DIAGNOSIS — M6281 Muscle weakness (generalized): Secondary | ICD-10-CM | POA: Diagnosis not present

## 2022-10-28 DIAGNOSIS — R41841 Cognitive communication deficit: Secondary | ICD-10-CM | POA: Diagnosis not present

## 2022-10-28 DIAGNOSIS — M6281 Muscle weakness (generalized): Secondary | ICD-10-CM | POA: Diagnosis not present

## 2022-10-28 DIAGNOSIS — R1314 Dysphagia, pharyngoesophageal phase: Secondary | ICD-10-CM | POA: Diagnosis not present

## 2022-10-28 DIAGNOSIS — S72001D Fracture of unspecified part of neck of right femur, subsequent encounter for closed fracture with routine healing: Secondary | ICD-10-CM | POA: Diagnosis not present

## 2022-10-28 DIAGNOSIS — R2681 Unsteadiness on feet: Secondary | ICD-10-CM | POA: Diagnosis not present

## 2022-10-29 DIAGNOSIS — R2681 Unsteadiness on feet: Secondary | ICD-10-CM | POA: Diagnosis not present

## 2022-10-29 DIAGNOSIS — R1314 Dysphagia, pharyngoesophageal phase: Secondary | ICD-10-CM | POA: Diagnosis not present

## 2022-10-29 DIAGNOSIS — M6281 Muscle weakness (generalized): Secondary | ICD-10-CM | POA: Diagnosis not present

## 2022-10-29 DIAGNOSIS — R41841 Cognitive communication deficit: Secondary | ICD-10-CM | POA: Diagnosis not present

## 2022-10-29 DIAGNOSIS — S72001D Fracture of unspecified part of neck of right femur, subsequent encounter for closed fracture with routine healing: Secondary | ICD-10-CM | POA: Diagnosis not present

## 2022-10-30 DIAGNOSIS — S72001D Fracture of unspecified part of neck of right femur, subsequent encounter for closed fracture with routine healing: Secondary | ICD-10-CM | POA: Diagnosis not present

## 2022-10-30 DIAGNOSIS — R41841 Cognitive communication deficit: Secondary | ICD-10-CM | POA: Diagnosis not present

## 2022-10-30 DIAGNOSIS — R2681 Unsteadiness on feet: Secondary | ICD-10-CM | POA: Diagnosis not present

## 2022-10-30 DIAGNOSIS — M6281 Muscle weakness (generalized): Secondary | ICD-10-CM | POA: Diagnosis not present

## 2022-10-30 DIAGNOSIS — R1314 Dysphagia, pharyngoesophageal phase: Secondary | ICD-10-CM | POA: Diagnosis not present

## 2022-10-31 DIAGNOSIS — S72001D Fracture of unspecified part of neck of right femur, subsequent encounter for closed fracture with routine healing: Secondary | ICD-10-CM | POA: Diagnosis not present

## 2022-10-31 DIAGNOSIS — M6281 Muscle weakness (generalized): Secondary | ICD-10-CM | POA: Diagnosis not present

## 2022-10-31 DIAGNOSIS — R41841 Cognitive communication deficit: Secondary | ICD-10-CM | POA: Diagnosis not present

## 2022-10-31 DIAGNOSIS — R1314 Dysphagia, pharyngoesophageal phase: Secondary | ICD-10-CM | POA: Diagnosis not present

## 2022-10-31 DIAGNOSIS — R2681 Unsteadiness on feet: Secondary | ICD-10-CM | POA: Diagnosis not present

## 2022-11-01 DIAGNOSIS — M6281 Muscle weakness (generalized): Secondary | ICD-10-CM | POA: Diagnosis not present

## 2022-11-01 DIAGNOSIS — R1314 Dysphagia, pharyngoesophageal phase: Secondary | ICD-10-CM | POA: Diagnosis not present

## 2022-11-01 DIAGNOSIS — R2681 Unsteadiness on feet: Secondary | ICD-10-CM | POA: Diagnosis not present

## 2022-11-01 DIAGNOSIS — R41841 Cognitive communication deficit: Secondary | ICD-10-CM | POA: Diagnosis not present

## 2022-11-01 DIAGNOSIS — S72001D Fracture of unspecified part of neck of right femur, subsequent encounter for closed fracture with routine healing: Secondary | ICD-10-CM | POA: Diagnosis not present

## 2022-11-03 DIAGNOSIS — R2681 Unsteadiness on feet: Secondary | ICD-10-CM | POA: Diagnosis not present

## 2022-11-03 DIAGNOSIS — R1314 Dysphagia, pharyngoesophageal phase: Secondary | ICD-10-CM | POA: Diagnosis not present

## 2022-11-03 DIAGNOSIS — S72001D Fracture of unspecified part of neck of right femur, subsequent encounter for closed fracture with routine healing: Secondary | ICD-10-CM | POA: Diagnosis not present

## 2022-11-03 DIAGNOSIS — R41841 Cognitive communication deficit: Secondary | ICD-10-CM | POA: Diagnosis not present

## 2022-11-03 DIAGNOSIS — M6281 Muscle weakness (generalized): Secondary | ICD-10-CM | POA: Diagnosis not present

## 2022-11-04 DIAGNOSIS — J45909 Unspecified asthma, uncomplicated: Secondary | ICD-10-CM | POA: Diagnosis not present

## 2022-11-04 DIAGNOSIS — K5900 Constipation, unspecified: Secondary | ICD-10-CM | POA: Diagnosis not present

## 2022-11-04 DIAGNOSIS — H409 Unspecified glaucoma: Secondary | ICD-10-CM | POA: Diagnosis not present

## 2022-11-04 DIAGNOSIS — S72001D Fracture of unspecified part of neck of right femur, subsequent encounter for closed fracture with routine healing: Secondary | ICD-10-CM | POA: Diagnosis not present

## 2022-11-04 DIAGNOSIS — R638 Other symptoms and signs concerning food and fluid intake: Secondary | ICD-10-CM | POA: Diagnosis not present

## 2022-11-04 DIAGNOSIS — E039 Hypothyroidism, unspecified: Secondary | ICD-10-CM | POA: Diagnosis not present

## 2022-11-04 DIAGNOSIS — R2681 Unsteadiness on feet: Secondary | ICD-10-CM | POA: Diagnosis not present

## 2022-11-04 DIAGNOSIS — R1310 Dysphagia, unspecified: Secondary | ICD-10-CM | POA: Diagnosis not present

## 2022-11-04 DIAGNOSIS — U071 COVID-19: Secondary | ICD-10-CM | POA: Diagnosis not present

## 2022-11-04 DIAGNOSIS — M6281 Muscle weakness (generalized): Secondary | ICD-10-CM | POA: Diagnosis not present

## 2022-11-04 DIAGNOSIS — K219 Gastro-esophageal reflux disease without esophagitis: Secondary | ICD-10-CM | POA: Diagnosis not present

## 2022-11-04 DIAGNOSIS — R339 Retention of urine, unspecified: Secondary | ICD-10-CM | POA: Diagnosis not present

## 2022-11-04 DIAGNOSIS — N3281 Overactive bladder: Secondary | ICD-10-CM | POA: Diagnosis not present

## 2022-11-04 DIAGNOSIS — R41841 Cognitive communication deficit: Secondary | ICD-10-CM | POA: Diagnosis not present

## 2022-11-04 DIAGNOSIS — M810 Age-related osteoporosis without current pathological fracture: Secondary | ICD-10-CM | POA: Diagnosis not present

## 2022-11-04 DIAGNOSIS — I10 Essential (primary) hypertension: Secondary | ICD-10-CM | POA: Diagnosis not present

## 2022-11-04 DIAGNOSIS — R1314 Dysphagia, pharyngoesophageal phase: Secondary | ICD-10-CM | POA: Diagnosis not present

## 2022-11-05 DIAGNOSIS — R2681 Unsteadiness on feet: Secondary | ICD-10-CM | POA: Diagnosis not present

## 2022-11-05 DIAGNOSIS — M6281 Muscle weakness (generalized): Secondary | ICD-10-CM | POA: Diagnosis not present

## 2022-11-05 DIAGNOSIS — S72001D Fracture of unspecified part of neck of right femur, subsequent encounter for closed fracture with routine healing: Secondary | ICD-10-CM | POA: Diagnosis not present

## 2022-11-05 DIAGNOSIS — R1314 Dysphagia, pharyngoesophageal phase: Secondary | ICD-10-CM | POA: Diagnosis not present

## 2022-11-05 DIAGNOSIS — R41841 Cognitive communication deficit: Secondary | ICD-10-CM | POA: Diagnosis not present

## 2022-11-06 DIAGNOSIS — S72001D Fracture of unspecified part of neck of right femur, subsequent encounter for closed fracture with routine healing: Secondary | ICD-10-CM | POA: Diagnosis not present

## 2022-11-06 DIAGNOSIS — R41841 Cognitive communication deficit: Secondary | ICD-10-CM | POA: Diagnosis not present

## 2022-11-06 DIAGNOSIS — R2681 Unsteadiness on feet: Secondary | ICD-10-CM | POA: Diagnosis not present

## 2022-11-06 DIAGNOSIS — R1314 Dysphagia, pharyngoesophageal phase: Secondary | ICD-10-CM | POA: Diagnosis not present

## 2022-11-06 DIAGNOSIS — M6281 Muscle weakness (generalized): Secondary | ICD-10-CM | POA: Diagnosis not present

## 2022-11-07 DIAGNOSIS — R1314 Dysphagia, pharyngoesophageal phase: Secondary | ICD-10-CM | POA: Diagnosis not present

## 2022-11-07 DIAGNOSIS — S72001D Fracture of unspecified part of neck of right femur, subsequent encounter for closed fracture with routine healing: Secondary | ICD-10-CM | POA: Diagnosis not present

## 2022-11-07 DIAGNOSIS — R2681 Unsteadiness on feet: Secondary | ICD-10-CM | POA: Diagnosis not present

## 2022-11-07 DIAGNOSIS — M6281 Muscle weakness (generalized): Secondary | ICD-10-CM | POA: Diagnosis not present

## 2022-11-07 DIAGNOSIS — R41841 Cognitive communication deficit: Secondary | ICD-10-CM | POA: Diagnosis not present

## 2022-11-08 DIAGNOSIS — S72001D Fracture of unspecified part of neck of right femur, subsequent encounter for closed fracture with routine healing: Secondary | ICD-10-CM | POA: Diagnosis not present

## 2022-11-08 DIAGNOSIS — R41841 Cognitive communication deficit: Secondary | ICD-10-CM | POA: Diagnosis not present

## 2022-11-08 DIAGNOSIS — R2681 Unsteadiness on feet: Secondary | ICD-10-CM | POA: Diagnosis not present

## 2022-11-08 DIAGNOSIS — M6281 Muscle weakness (generalized): Secondary | ICD-10-CM | POA: Diagnosis not present

## 2022-11-08 DIAGNOSIS — R1314 Dysphagia, pharyngoesophageal phase: Secondary | ICD-10-CM | POA: Diagnosis not present

## 2022-11-10 DIAGNOSIS — R2681 Unsteadiness on feet: Secondary | ICD-10-CM | POA: Diagnosis not present

## 2022-11-10 DIAGNOSIS — S72001D Fracture of unspecified part of neck of right femur, subsequent encounter for closed fracture with routine healing: Secondary | ICD-10-CM | POA: Diagnosis not present

## 2022-11-10 DIAGNOSIS — M6281 Muscle weakness (generalized): Secondary | ICD-10-CM | POA: Diagnosis not present

## 2022-11-10 DIAGNOSIS — R1314 Dysphagia, pharyngoesophageal phase: Secondary | ICD-10-CM | POA: Diagnosis not present

## 2022-11-10 DIAGNOSIS — R41841 Cognitive communication deficit: Secondary | ICD-10-CM | POA: Diagnosis not present

## 2022-11-11 DIAGNOSIS — M6281 Muscle weakness (generalized): Secondary | ICD-10-CM | POA: Diagnosis not present

## 2022-11-11 DIAGNOSIS — R1314 Dysphagia, pharyngoesophageal phase: Secondary | ICD-10-CM | POA: Diagnosis not present

## 2022-11-11 DIAGNOSIS — S72001D Fracture of unspecified part of neck of right femur, subsequent encounter for closed fracture with routine healing: Secondary | ICD-10-CM | POA: Diagnosis not present

## 2022-11-11 DIAGNOSIS — R2681 Unsteadiness on feet: Secondary | ICD-10-CM | POA: Diagnosis not present

## 2022-11-11 DIAGNOSIS — R41841 Cognitive communication deficit: Secondary | ICD-10-CM | POA: Diagnosis not present

## 2022-11-12 DIAGNOSIS — R41841 Cognitive communication deficit: Secondary | ICD-10-CM | POA: Diagnosis not present

## 2022-11-12 DIAGNOSIS — M6281 Muscle weakness (generalized): Secondary | ICD-10-CM | POA: Diagnosis not present

## 2022-11-12 DIAGNOSIS — R1314 Dysphagia, pharyngoesophageal phase: Secondary | ICD-10-CM | POA: Diagnosis not present

## 2022-11-12 DIAGNOSIS — R2681 Unsteadiness on feet: Secondary | ICD-10-CM | POA: Diagnosis not present

## 2022-11-12 DIAGNOSIS — S72001D Fracture of unspecified part of neck of right femur, subsequent encounter for closed fracture with routine healing: Secondary | ICD-10-CM | POA: Diagnosis not present

## 2022-11-13 DIAGNOSIS — R41841 Cognitive communication deficit: Secondary | ICD-10-CM | POA: Diagnosis not present

## 2022-11-13 DIAGNOSIS — S72001D Fracture of unspecified part of neck of right femur, subsequent encounter for closed fracture with routine healing: Secondary | ICD-10-CM | POA: Diagnosis not present

## 2022-11-13 DIAGNOSIS — R2681 Unsteadiness on feet: Secondary | ICD-10-CM | POA: Diagnosis not present

## 2022-11-13 DIAGNOSIS — M6281 Muscle weakness (generalized): Secondary | ICD-10-CM | POA: Diagnosis not present

## 2022-11-13 DIAGNOSIS — R1314 Dysphagia, pharyngoesophageal phase: Secondary | ICD-10-CM | POA: Diagnosis not present

## 2022-11-14 DIAGNOSIS — S72001D Fracture of unspecified part of neck of right femur, subsequent encounter for closed fracture with routine healing: Secondary | ICD-10-CM | POA: Diagnosis not present

## 2022-11-14 DIAGNOSIS — R41841 Cognitive communication deficit: Secondary | ICD-10-CM | POA: Diagnosis not present

## 2022-11-14 DIAGNOSIS — R2681 Unsteadiness on feet: Secondary | ICD-10-CM | POA: Diagnosis not present

## 2022-11-14 DIAGNOSIS — R1314 Dysphagia, pharyngoesophageal phase: Secondary | ICD-10-CM | POA: Diagnosis not present

## 2022-11-14 DIAGNOSIS — M6281 Muscle weakness (generalized): Secondary | ICD-10-CM | POA: Diagnosis not present

## 2022-11-14 DIAGNOSIS — K219 Gastro-esophageal reflux disease without esophagitis: Secondary | ICD-10-CM | POA: Diagnosis not present

## 2022-11-14 DIAGNOSIS — N3281 Overactive bladder: Secondary | ICD-10-CM | POA: Diagnosis not present

## 2022-11-14 DIAGNOSIS — K5900 Constipation, unspecified: Secondary | ICD-10-CM | POA: Diagnosis not present

## 2022-11-14 DIAGNOSIS — U071 COVID-19: Secondary | ICD-10-CM | POA: Diagnosis not present

## 2022-11-14 DIAGNOSIS — M810 Age-related osteoporosis without current pathological fracture: Secondary | ICD-10-CM | POA: Diagnosis not present

## 2022-11-14 DIAGNOSIS — E039 Hypothyroidism, unspecified: Secondary | ICD-10-CM | POA: Diagnosis not present

## 2022-11-14 DIAGNOSIS — D649 Anemia, unspecified: Secondary | ICD-10-CM | POA: Diagnosis not present

## 2022-11-14 DIAGNOSIS — I10 Essential (primary) hypertension: Secondary | ICD-10-CM | POA: Diagnosis not present

## 2022-11-14 DIAGNOSIS — R638 Other symptoms and signs concerning food and fluid intake: Secondary | ICD-10-CM | POA: Diagnosis not present

## 2022-11-14 DIAGNOSIS — R1310 Dysphagia, unspecified: Secondary | ICD-10-CM | POA: Diagnosis not present

## 2022-11-15 DIAGNOSIS — S72001D Fracture of unspecified part of neck of right femur, subsequent encounter for closed fracture with routine healing: Secondary | ICD-10-CM | POA: Diagnosis not present

## 2022-11-15 DIAGNOSIS — R1314 Dysphagia, pharyngoesophageal phase: Secondary | ICD-10-CM | POA: Diagnosis not present

## 2022-11-15 DIAGNOSIS — R2681 Unsteadiness on feet: Secondary | ICD-10-CM | POA: Diagnosis not present

## 2022-11-15 DIAGNOSIS — M6281 Muscle weakness (generalized): Secondary | ICD-10-CM | POA: Diagnosis not present

## 2022-11-15 DIAGNOSIS — R41841 Cognitive communication deficit: Secondary | ICD-10-CM | POA: Diagnosis not present

## 2022-11-17 DIAGNOSIS — M6281 Muscle weakness (generalized): Secondary | ICD-10-CM | POA: Diagnosis not present

## 2022-11-17 DIAGNOSIS — S72001D Fracture of unspecified part of neck of right femur, subsequent encounter for closed fracture with routine healing: Secondary | ICD-10-CM | POA: Diagnosis not present

## 2022-11-17 DIAGNOSIS — R2681 Unsteadiness on feet: Secondary | ICD-10-CM | POA: Diagnosis not present

## 2022-11-17 DIAGNOSIS — R41841 Cognitive communication deficit: Secondary | ICD-10-CM | POA: Diagnosis not present

## 2022-11-17 DIAGNOSIS — R1314 Dysphagia, pharyngoesophageal phase: Secondary | ICD-10-CM | POA: Diagnosis not present

## 2022-11-18 DIAGNOSIS — R2681 Unsteadiness on feet: Secondary | ICD-10-CM | POA: Diagnosis not present

## 2022-11-18 DIAGNOSIS — S72001D Fracture of unspecified part of neck of right femur, subsequent encounter for closed fracture with routine healing: Secondary | ICD-10-CM | POA: Diagnosis not present

## 2022-11-18 DIAGNOSIS — R41841 Cognitive communication deficit: Secondary | ICD-10-CM | POA: Diagnosis not present

## 2022-11-18 DIAGNOSIS — R1314 Dysphagia, pharyngoesophageal phase: Secondary | ICD-10-CM | POA: Diagnosis not present

## 2022-11-18 DIAGNOSIS — M6281 Muscle weakness (generalized): Secondary | ICD-10-CM | POA: Diagnosis not present

## 2022-11-19 DIAGNOSIS — S72001D Fracture of unspecified part of neck of right femur, subsequent encounter for closed fracture with routine healing: Secondary | ICD-10-CM | POA: Diagnosis not present

## 2022-11-19 DIAGNOSIS — S72031D Displaced midcervical fracture of right femur, subsequent encounter for closed fracture with routine healing: Secondary | ICD-10-CM | POA: Diagnosis not present

## 2022-11-19 DIAGNOSIS — R41841 Cognitive communication deficit: Secondary | ICD-10-CM | POA: Diagnosis not present

## 2022-11-19 DIAGNOSIS — R2681 Unsteadiness on feet: Secondary | ICD-10-CM | POA: Diagnosis not present

## 2022-11-19 DIAGNOSIS — R1314 Dysphagia, pharyngoesophageal phase: Secondary | ICD-10-CM | POA: Diagnosis not present

## 2022-11-19 DIAGNOSIS — M6281 Muscle weakness (generalized): Secondary | ICD-10-CM | POA: Diagnosis not present

## 2022-11-20 DIAGNOSIS — R2681 Unsteadiness on feet: Secondary | ICD-10-CM | POA: Diagnosis not present

## 2022-11-20 DIAGNOSIS — R1314 Dysphagia, pharyngoesophageal phase: Secondary | ICD-10-CM | POA: Diagnosis not present

## 2022-11-20 DIAGNOSIS — M6281 Muscle weakness (generalized): Secondary | ICD-10-CM | POA: Diagnosis not present

## 2022-11-20 DIAGNOSIS — R41841 Cognitive communication deficit: Secondary | ICD-10-CM | POA: Diagnosis not present

## 2022-11-20 DIAGNOSIS — S72001D Fracture of unspecified part of neck of right femur, subsequent encounter for closed fracture with routine healing: Secondary | ICD-10-CM | POA: Diagnosis not present

## 2022-11-21 DIAGNOSIS — S72001D Fracture of unspecified part of neck of right femur, subsequent encounter for closed fracture with routine healing: Secondary | ICD-10-CM | POA: Diagnosis not present

## 2022-11-21 DIAGNOSIS — M6281 Muscle weakness (generalized): Secondary | ICD-10-CM | POA: Diagnosis not present

## 2022-11-21 DIAGNOSIS — R41841 Cognitive communication deficit: Secondary | ICD-10-CM | POA: Diagnosis not present

## 2022-11-21 DIAGNOSIS — R2681 Unsteadiness on feet: Secondary | ICD-10-CM | POA: Diagnosis not present

## 2022-11-21 DIAGNOSIS — R1314 Dysphagia, pharyngoesophageal phase: Secondary | ICD-10-CM | POA: Diagnosis not present

## 2022-11-22 DIAGNOSIS — M6281 Muscle weakness (generalized): Secondary | ICD-10-CM | POA: Diagnosis not present

## 2022-11-22 DIAGNOSIS — S72001D Fracture of unspecified part of neck of right femur, subsequent encounter for closed fracture with routine healing: Secondary | ICD-10-CM | POA: Diagnosis not present

## 2022-11-22 DIAGNOSIS — R2681 Unsteadiness on feet: Secondary | ICD-10-CM | POA: Diagnosis not present

## 2022-11-22 DIAGNOSIS — R41841 Cognitive communication deficit: Secondary | ICD-10-CM | POA: Diagnosis not present

## 2022-11-22 DIAGNOSIS — R1314 Dysphagia, pharyngoesophageal phase: Secondary | ICD-10-CM | POA: Diagnosis not present

## 2022-11-23 DIAGNOSIS — R41841 Cognitive communication deficit: Secondary | ICD-10-CM | POA: Diagnosis not present

## 2022-11-23 DIAGNOSIS — R1314 Dysphagia, pharyngoesophageal phase: Secondary | ICD-10-CM | POA: Diagnosis not present

## 2022-11-23 DIAGNOSIS — S72001D Fracture of unspecified part of neck of right femur, subsequent encounter for closed fracture with routine healing: Secondary | ICD-10-CM | POA: Diagnosis not present

## 2022-11-23 DIAGNOSIS — M6281 Muscle weakness (generalized): Secondary | ICD-10-CM | POA: Diagnosis not present

## 2022-11-23 DIAGNOSIS — R2681 Unsteadiness on feet: Secondary | ICD-10-CM | POA: Diagnosis not present

## 2022-11-25 DIAGNOSIS — E039 Hypothyroidism, unspecified: Secondary | ICD-10-CM | POA: Diagnosis not present

## 2022-11-25 DIAGNOSIS — E785 Hyperlipidemia, unspecified: Secondary | ICD-10-CM | POA: Diagnosis not present

## 2022-11-25 DIAGNOSIS — R1314 Dysphagia, pharyngoesophageal phase: Secondary | ICD-10-CM | POA: Diagnosis not present

## 2022-11-25 DIAGNOSIS — R41841 Cognitive communication deficit: Secondary | ICD-10-CM | POA: Diagnosis not present

## 2022-11-25 DIAGNOSIS — I10 Essential (primary) hypertension: Secondary | ICD-10-CM | POA: Diagnosis not present

## 2022-11-25 DIAGNOSIS — R300 Dysuria: Secondary | ICD-10-CM | POA: Diagnosis not present

## 2022-11-25 DIAGNOSIS — R2681 Unsteadiness on feet: Secondary | ICD-10-CM | POA: Diagnosis not present

## 2022-11-25 DIAGNOSIS — M810 Age-related osteoporosis without current pathological fracture: Secondary | ICD-10-CM | POA: Diagnosis not present

## 2022-11-25 DIAGNOSIS — K219 Gastro-esophageal reflux disease without esophagitis: Secondary | ICD-10-CM | POA: Diagnosis not present

## 2022-11-25 DIAGNOSIS — J45909 Unspecified asthma, uncomplicated: Secondary | ICD-10-CM | POA: Diagnosis not present

## 2022-11-25 DIAGNOSIS — S72001D Fracture of unspecified part of neck of right femur, subsequent encounter for closed fracture with routine healing: Secondary | ICD-10-CM | POA: Diagnosis not present

## 2022-11-25 DIAGNOSIS — M6281 Muscle weakness (generalized): Secondary | ICD-10-CM | POA: Diagnosis not present

## 2022-11-25 DIAGNOSIS — R1310 Dysphagia, unspecified: Secondary | ICD-10-CM | POA: Diagnosis not present

## 2022-11-25 DIAGNOSIS — R339 Retention of urine, unspecified: Secondary | ICD-10-CM | POA: Diagnosis not present

## 2022-11-25 DIAGNOSIS — N3281 Overactive bladder: Secondary | ICD-10-CM | POA: Diagnosis not present

## 2022-11-25 DIAGNOSIS — R638 Other symptoms and signs concerning food and fluid intake: Secondary | ICD-10-CM | POA: Diagnosis not present

## 2022-11-26 DIAGNOSIS — R2681 Unsteadiness on feet: Secondary | ICD-10-CM | POA: Diagnosis not present

## 2022-11-26 DIAGNOSIS — R1314 Dysphagia, pharyngoesophageal phase: Secondary | ICD-10-CM | POA: Diagnosis not present

## 2022-11-26 DIAGNOSIS — M6281 Muscle weakness (generalized): Secondary | ICD-10-CM | POA: Diagnosis not present

## 2022-11-26 DIAGNOSIS — N390 Urinary tract infection, site not specified: Secondary | ICD-10-CM | POA: Diagnosis not present

## 2022-11-26 DIAGNOSIS — R41841 Cognitive communication deficit: Secondary | ICD-10-CM | POA: Diagnosis not present

## 2022-11-26 DIAGNOSIS — S72001D Fracture of unspecified part of neck of right femur, subsequent encounter for closed fracture with routine healing: Secondary | ICD-10-CM | POA: Diagnosis not present

## 2022-11-27 DIAGNOSIS — M6281 Muscle weakness (generalized): Secondary | ICD-10-CM | POA: Diagnosis not present

## 2022-11-27 DIAGNOSIS — R1314 Dysphagia, pharyngoesophageal phase: Secondary | ICD-10-CM | POA: Diagnosis not present

## 2022-11-27 DIAGNOSIS — R2681 Unsteadiness on feet: Secondary | ICD-10-CM | POA: Diagnosis not present

## 2022-11-27 DIAGNOSIS — R41841 Cognitive communication deficit: Secondary | ICD-10-CM | POA: Diagnosis not present

## 2022-11-27 DIAGNOSIS — S72001D Fracture of unspecified part of neck of right femur, subsequent encounter for closed fracture with routine healing: Secondary | ICD-10-CM | POA: Diagnosis not present

## 2022-11-28 DIAGNOSIS — S72001D Fracture of unspecified part of neck of right femur, subsequent encounter for closed fracture with routine healing: Secondary | ICD-10-CM | POA: Diagnosis not present

## 2022-11-28 DIAGNOSIS — R1314 Dysphagia, pharyngoesophageal phase: Secondary | ICD-10-CM | POA: Diagnosis not present

## 2022-11-28 DIAGNOSIS — R41841 Cognitive communication deficit: Secondary | ICD-10-CM | POA: Diagnosis not present

## 2022-11-28 DIAGNOSIS — M6281 Muscle weakness (generalized): Secondary | ICD-10-CM | POA: Diagnosis not present

## 2022-11-28 DIAGNOSIS — R2681 Unsteadiness on feet: Secondary | ICD-10-CM | POA: Diagnosis not present

## 2022-11-29 DIAGNOSIS — D649 Anemia, unspecified: Secondary | ICD-10-CM | POA: Diagnosis not present

## 2022-11-29 DIAGNOSIS — M6281 Muscle weakness (generalized): Secondary | ICD-10-CM | POA: Diagnosis not present

## 2022-11-29 DIAGNOSIS — R2681 Unsteadiness on feet: Secondary | ICD-10-CM | POA: Diagnosis not present

## 2022-11-29 DIAGNOSIS — N390 Urinary tract infection, site not specified: Secondary | ICD-10-CM | POA: Diagnosis not present

## 2022-11-29 DIAGNOSIS — R1314 Dysphagia, pharyngoesophageal phase: Secondary | ICD-10-CM | POA: Diagnosis not present

## 2022-11-29 DIAGNOSIS — I10 Essential (primary) hypertension: Secondary | ICD-10-CM | POA: Diagnosis not present

## 2022-11-29 DIAGNOSIS — R338 Other retention of urine: Secondary | ICD-10-CM | POA: Diagnosis not present

## 2022-11-29 DIAGNOSIS — R41841 Cognitive communication deficit: Secondary | ICD-10-CM | POA: Diagnosis not present

## 2022-11-29 DIAGNOSIS — S72001D Fracture of unspecified part of neck of right femur, subsequent encounter for closed fracture with routine healing: Secondary | ICD-10-CM | POA: Diagnosis not present

## 2022-12-01 DIAGNOSIS — R1314 Dysphagia, pharyngoesophageal phase: Secondary | ICD-10-CM | POA: Diagnosis not present

## 2022-12-01 DIAGNOSIS — R41841 Cognitive communication deficit: Secondary | ICD-10-CM | POA: Diagnosis not present

## 2022-12-01 DIAGNOSIS — R2681 Unsteadiness on feet: Secondary | ICD-10-CM | POA: Diagnosis not present

## 2022-12-01 DIAGNOSIS — M6281 Muscle weakness (generalized): Secondary | ICD-10-CM | POA: Diagnosis not present

## 2022-12-01 DIAGNOSIS — S72001D Fracture of unspecified part of neck of right femur, subsequent encounter for closed fracture with routine healing: Secondary | ICD-10-CM | POA: Diagnosis not present

## 2022-12-02 DIAGNOSIS — K5900 Constipation, unspecified: Secondary | ICD-10-CM | POA: Diagnosis not present

## 2022-12-02 DIAGNOSIS — S72001D Fracture of unspecified part of neck of right femur, subsequent encounter for closed fracture with routine healing: Secondary | ICD-10-CM | POA: Diagnosis not present

## 2022-12-02 DIAGNOSIS — M6281 Muscle weakness (generalized): Secondary | ICD-10-CM | POA: Diagnosis not present

## 2022-12-02 DIAGNOSIS — R1310 Dysphagia, unspecified: Secondary | ICD-10-CM | POA: Diagnosis not present

## 2022-12-02 DIAGNOSIS — R300 Dysuria: Secondary | ICD-10-CM | POA: Diagnosis not present

## 2022-12-02 DIAGNOSIS — N3281 Overactive bladder: Secondary | ICD-10-CM | POA: Diagnosis not present

## 2022-12-02 DIAGNOSIS — I10 Essential (primary) hypertension: Secondary | ICD-10-CM | POA: Diagnosis not present

## 2022-12-02 DIAGNOSIS — R1314 Dysphagia, pharyngoesophageal phase: Secondary | ICD-10-CM | POA: Diagnosis not present

## 2022-12-02 DIAGNOSIS — E785 Hyperlipidemia, unspecified: Secondary | ICD-10-CM | POA: Diagnosis not present

## 2022-12-02 DIAGNOSIS — J45909 Unspecified asthma, uncomplicated: Secondary | ICD-10-CM | POA: Diagnosis not present

## 2022-12-02 DIAGNOSIS — R41841 Cognitive communication deficit: Secondary | ICD-10-CM | POA: Diagnosis not present

## 2022-12-02 DIAGNOSIS — D649 Anemia, unspecified: Secondary | ICD-10-CM | POA: Diagnosis not present

## 2022-12-02 DIAGNOSIS — H409 Unspecified glaucoma: Secondary | ICD-10-CM | POA: Diagnosis not present

## 2022-12-02 DIAGNOSIS — R2681 Unsteadiness on feet: Secondary | ICD-10-CM | POA: Diagnosis not present

## 2022-12-02 DIAGNOSIS — R339 Retention of urine, unspecified: Secondary | ICD-10-CM | POA: Diagnosis not present

## 2022-12-03 DIAGNOSIS — R2681 Unsteadiness on feet: Secondary | ICD-10-CM | POA: Diagnosis not present

## 2022-12-03 DIAGNOSIS — M6281 Muscle weakness (generalized): Secondary | ICD-10-CM | POA: Diagnosis not present

## 2022-12-03 DIAGNOSIS — R1314 Dysphagia, pharyngoesophageal phase: Secondary | ICD-10-CM | POA: Diagnosis not present

## 2022-12-03 DIAGNOSIS — R41841 Cognitive communication deficit: Secondary | ICD-10-CM | POA: Diagnosis not present

## 2022-12-03 DIAGNOSIS — S72001D Fracture of unspecified part of neck of right femur, subsequent encounter for closed fracture with routine healing: Secondary | ICD-10-CM | POA: Diagnosis not present

## 2022-12-03 DIAGNOSIS — N390 Urinary tract infection, site not specified: Secondary | ICD-10-CM | POA: Diagnosis not present

## 2022-12-04 DIAGNOSIS — M6281 Muscle weakness (generalized): Secondary | ICD-10-CM | POA: Diagnosis not present

## 2022-12-04 DIAGNOSIS — S72001D Fracture of unspecified part of neck of right femur, subsequent encounter for closed fracture with routine healing: Secondary | ICD-10-CM | POA: Diagnosis not present

## 2022-12-04 DIAGNOSIS — R41841 Cognitive communication deficit: Secondary | ICD-10-CM | POA: Diagnosis not present

## 2022-12-04 DIAGNOSIS — R2681 Unsteadiness on feet: Secondary | ICD-10-CM | POA: Diagnosis not present

## 2022-12-04 DIAGNOSIS — R1314 Dysphagia, pharyngoesophageal phase: Secondary | ICD-10-CM | POA: Diagnosis not present

## 2022-12-05 DIAGNOSIS — M6281 Muscle weakness (generalized): Secondary | ICD-10-CM | POA: Diagnosis not present

## 2022-12-05 DIAGNOSIS — R1314 Dysphagia, pharyngoesophageal phase: Secondary | ICD-10-CM | POA: Diagnosis not present

## 2022-12-05 DIAGNOSIS — R41841 Cognitive communication deficit: Secondary | ICD-10-CM | POA: Diagnosis not present

## 2022-12-05 DIAGNOSIS — R2681 Unsteadiness on feet: Secondary | ICD-10-CM | POA: Diagnosis not present

## 2022-12-05 DIAGNOSIS — S72001D Fracture of unspecified part of neck of right femur, subsequent encounter for closed fracture with routine healing: Secondary | ICD-10-CM | POA: Diagnosis not present

## 2022-12-06 DIAGNOSIS — E785 Hyperlipidemia, unspecified: Secondary | ICD-10-CM | POA: Diagnosis not present

## 2022-12-06 DIAGNOSIS — K219 Gastro-esophageal reflux disease without esophagitis: Secondary | ICD-10-CM | POA: Diagnosis not present

## 2022-12-06 DIAGNOSIS — N390 Urinary tract infection, site not specified: Secondary | ICD-10-CM | POA: Diagnosis not present

## 2022-12-06 DIAGNOSIS — E039 Hypothyroidism, unspecified: Secondary | ICD-10-CM | POA: Diagnosis not present

## 2022-12-06 DIAGNOSIS — J45909 Unspecified asthma, uncomplicated: Secondary | ICD-10-CM | POA: Diagnosis not present

## 2022-12-06 DIAGNOSIS — R638 Other symptoms and signs concerning food and fluid intake: Secondary | ICD-10-CM | POA: Diagnosis not present

## 2022-12-06 DIAGNOSIS — I10 Essential (primary) hypertension: Secondary | ICD-10-CM | POA: Diagnosis not present

## 2022-12-06 DIAGNOSIS — R1310 Dysphagia, unspecified: Secondary | ICD-10-CM | POA: Diagnosis not present

## 2022-12-06 DIAGNOSIS — S72001D Fracture of unspecified part of neck of right femur, subsequent encounter for closed fracture with routine healing: Secondary | ICD-10-CM | POA: Diagnosis not present

## 2022-12-06 DIAGNOSIS — N3281 Overactive bladder: Secondary | ICD-10-CM | POA: Diagnosis not present

## 2022-12-06 DIAGNOSIS — M810 Age-related osteoporosis without current pathological fracture: Secondary | ICD-10-CM | POA: Diagnosis not present

## 2022-12-06 DIAGNOSIS — K5900 Constipation, unspecified: Secondary | ICD-10-CM | POA: Diagnosis not present

## 2022-12-08 DIAGNOSIS — R1314 Dysphagia, pharyngoesophageal phase: Secondary | ICD-10-CM | POA: Diagnosis not present

## 2022-12-08 DIAGNOSIS — R2681 Unsteadiness on feet: Secondary | ICD-10-CM | POA: Diagnosis not present

## 2022-12-08 DIAGNOSIS — R41841 Cognitive communication deficit: Secondary | ICD-10-CM | POA: Diagnosis not present

## 2022-12-08 DIAGNOSIS — M6281 Muscle weakness (generalized): Secondary | ICD-10-CM | POA: Diagnosis not present

## 2022-12-08 DIAGNOSIS — S72001D Fracture of unspecified part of neck of right femur, subsequent encounter for closed fracture with routine healing: Secondary | ICD-10-CM | POA: Diagnosis not present

## 2022-12-09 DIAGNOSIS — R2681 Unsteadiness on feet: Secondary | ICD-10-CM | POA: Diagnosis not present

## 2022-12-09 DIAGNOSIS — M6281 Muscle weakness (generalized): Secondary | ICD-10-CM | POA: Diagnosis not present

## 2022-12-09 DIAGNOSIS — R1314 Dysphagia, pharyngoesophageal phase: Secondary | ICD-10-CM | POA: Diagnosis not present

## 2022-12-09 DIAGNOSIS — S72001D Fracture of unspecified part of neck of right femur, subsequent encounter for closed fracture with routine healing: Secondary | ICD-10-CM | POA: Diagnosis not present

## 2022-12-09 DIAGNOSIS — R41841 Cognitive communication deficit: Secondary | ICD-10-CM | POA: Diagnosis not present

## 2022-12-10 DIAGNOSIS — M6281 Muscle weakness (generalized): Secondary | ICD-10-CM | POA: Diagnosis not present

## 2022-12-10 DIAGNOSIS — S72001D Fracture of unspecified part of neck of right femur, subsequent encounter for closed fracture with routine healing: Secondary | ICD-10-CM | POA: Diagnosis not present

## 2022-12-10 DIAGNOSIS — R1314 Dysphagia, pharyngoesophageal phase: Secondary | ICD-10-CM | POA: Diagnosis not present

## 2022-12-10 DIAGNOSIS — R2681 Unsteadiness on feet: Secondary | ICD-10-CM | POA: Diagnosis not present

## 2022-12-10 DIAGNOSIS — R41841 Cognitive communication deficit: Secondary | ICD-10-CM | POA: Diagnosis not present

## 2022-12-11 DIAGNOSIS — M6281 Muscle weakness (generalized): Secondary | ICD-10-CM | POA: Diagnosis not present

## 2022-12-11 DIAGNOSIS — R41841 Cognitive communication deficit: Secondary | ICD-10-CM | POA: Diagnosis not present

## 2022-12-11 DIAGNOSIS — R2681 Unsteadiness on feet: Secondary | ICD-10-CM | POA: Diagnosis not present

## 2022-12-11 DIAGNOSIS — S72001D Fracture of unspecified part of neck of right femur, subsequent encounter for closed fracture with routine healing: Secondary | ICD-10-CM | POA: Diagnosis not present

## 2022-12-11 DIAGNOSIS — R1314 Dysphagia, pharyngoesophageal phase: Secondary | ICD-10-CM | POA: Diagnosis not present

## 2022-12-12 DIAGNOSIS — M6281 Muscle weakness (generalized): Secondary | ICD-10-CM | POA: Diagnosis not present

## 2022-12-12 DIAGNOSIS — R1314 Dysphagia, pharyngoesophageal phase: Secondary | ICD-10-CM | POA: Diagnosis not present

## 2022-12-12 DIAGNOSIS — D649 Anemia, unspecified: Secondary | ICD-10-CM | POA: Diagnosis not present

## 2022-12-12 DIAGNOSIS — R638 Other symptoms and signs concerning food and fluid intake: Secondary | ICD-10-CM | POA: Diagnosis not present

## 2022-12-12 DIAGNOSIS — E039 Hypothyroidism, unspecified: Secondary | ICD-10-CM | POA: Diagnosis not present

## 2022-12-12 DIAGNOSIS — N3281 Overactive bladder: Secondary | ICD-10-CM | POA: Diagnosis not present

## 2022-12-12 DIAGNOSIS — R41841 Cognitive communication deficit: Secondary | ICD-10-CM | POA: Diagnosis not present

## 2022-12-12 DIAGNOSIS — K219 Gastro-esophageal reflux disease without esophagitis: Secondary | ICD-10-CM | POA: Diagnosis not present

## 2022-12-12 DIAGNOSIS — R2681 Unsteadiness on feet: Secondary | ICD-10-CM | POA: Diagnosis not present

## 2022-12-12 DIAGNOSIS — M810 Age-related osteoporosis without current pathological fracture: Secondary | ICD-10-CM | POA: Diagnosis not present

## 2022-12-12 DIAGNOSIS — R1310 Dysphagia, unspecified: Secondary | ICD-10-CM | POA: Diagnosis not present

## 2022-12-12 DIAGNOSIS — S72001D Fracture of unspecified part of neck of right femur, subsequent encounter for closed fracture with routine healing: Secondary | ICD-10-CM | POA: Diagnosis not present

## 2022-12-12 DIAGNOSIS — I10 Essential (primary) hypertension: Secondary | ICD-10-CM | POA: Diagnosis not present

## 2022-12-13 DIAGNOSIS — M6281 Muscle weakness (generalized): Secondary | ICD-10-CM | POA: Diagnosis not present

## 2022-12-13 DIAGNOSIS — S72001D Fracture of unspecified part of neck of right femur, subsequent encounter for closed fracture with routine healing: Secondary | ICD-10-CM | POA: Diagnosis not present

## 2022-12-13 DIAGNOSIS — R2681 Unsteadiness on feet: Secondary | ICD-10-CM | POA: Diagnosis not present

## 2022-12-15 DIAGNOSIS — S72001D Fracture of unspecified part of neck of right femur, subsequent encounter for closed fracture with routine healing: Secondary | ICD-10-CM | POA: Diagnosis not present

## 2022-12-15 DIAGNOSIS — M6281 Muscle weakness (generalized): Secondary | ICD-10-CM | POA: Diagnosis not present

## 2022-12-15 DIAGNOSIS — R2681 Unsteadiness on feet: Secondary | ICD-10-CM | POA: Diagnosis not present

## 2022-12-16 DIAGNOSIS — R2681 Unsteadiness on feet: Secondary | ICD-10-CM | POA: Diagnosis not present

## 2022-12-16 DIAGNOSIS — M6281 Muscle weakness (generalized): Secondary | ICD-10-CM | POA: Diagnosis not present

## 2022-12-16 DIAGNOSIS — S72001D Fracture of unspecified part of neck of right femur, subsequent encounter for closed fracture with routine healing: Secondary | ICD-10-CM | POA: Diagnosis not present

## 2022-12-17 DIAGNOSIS — R2681 Unsteadiness on feet: Secondary | ICD-10-CM | POA: Diagnosis not present

## 2022-12-17 DIAGNOSIS — M6281 Muscle weakness (generalized): Secondary | ICD-10-CM | POA: Diagnosis not present

## 2022-12-17 DIAGNOSIS — S72001D Fracture of unspecified part of neck of right femur, subsequent encounter for closed fracture with routine healing: Secondary | ICD-10-CM | POA: Diagnosis not present

## 2022-12-18 DIAGNOSIS — S72001D Fracture of unspecified part of neck of right femur, subsequent encounter for closed fracture with routine healing: Secondary | ICD-10-CM | POA: Diagnosis not present

## 2022-12-18 DIAGNOSIS — M6281 Muscle weakness (generalized): Secondary | ICD-10-CM | POA: Diagnosis not present

## 2022-12-18 DIAGNOSIS — R2681 Unsteadiness on feet: Secondary | ICD-10-CM | POA: Diagnosis not present

## 2022-12-19 DIAGNOSIS — R2681 Unsteadiness on feet: Secondary | ICD-10-CM | POA: Diagnosis not present

## 2022-12-19 DIAGNOSIS — M6281 Muscle weakness (generalized): Secondary | ICD-10-CM | POA: Diagnosis not present

## 2022-12-19 DIAGNOSIS — S72001D Fracture of unspecified part of neck of right femur, subsequent encounter for closed fracture with routine healing: Secondary | ICD-10-CM | POA: Diagnosis not present

## 2022-12-20 DIAGNOSIS — R2681 Unsteadiness on feet: Secondary | ICD-10-CM | POA: Diagnosis not present

## 2022-12-20 DIAGNOSIS — M6281 Muscle weakness (generalized): Secondary | ICD-10-CM | POA: Diagnosis not present

## 2022-12-20 DIAGNOSIS — S72001D Fracture of unspecified part of neck of right femur, subsequent encounter for closed fracture with routine healing: Secondary | ICD-10-CM | POA: Diagnosis not present

## 2022-12-20 DIAGNOSIS — I10 Essential (primary) hypertension: Secondary | ICD-10-CM | POA: Diagnosis not present

## 2022-12-20 DIAGNOSIS — D649 Anemia, unspecified: Secondary | ICD-10-CM | POA: Diagnosis not present

## 2022-12-23 DIAGNOSIS — R2681 Unsteadiness on feet: Secondary | ICD-10-CM | POA: Diagnosis not present

## 2022-12-23 DIAGNOSIS — S72001D Fracture of unspecified part of neck of right femur, subsequent encounter for closed fracture with routine healing: Secondary | ICD-10-CM | POA: Diagnosis not present

## 2022-12-23 DIAGNOSIS — M6281 Muscle weakness (generalized): Secondary | ICD-10-CM | POA: Diagnosis not present

## 2022-12-24 DIAGNOSIS — S72001D Fracture of unspecified part of neck of right femur, subsequent encounter for closed fracture with routine healing: Secondary | ICD-10-CM | POA: Diagnosis not present

## 2022-12-24 DIAGNOSIS — M6281 Muscle weakness (generalized): Secondary | ICD-10-CM | POA: Diagnosis not present

## 2022-12-24 DIAGNOSIS — R2681 Unsteadiness on feet: Secondary | ICD-10-CM | POA: Diagnosis not present

## 2022-12-25 DIAGNOSIS — S72001D Fracture of unspecified part of neck of right femur, subsequent encounter for closed fracture with routine healing: Secondary | ICD-10-CM | POA: Diagnosis not present

## 2022-12-25 DIAGNOSIS — M6281 Muscle weakness (generalized): Secondary | ICD-10-CM | POA: Diagnosis not present

## 2022-12-25 DIAGNOSIS — R2681 Unsteadiness on feet: Secondary | ICD-10-CM | POA: Diagnosis not present

## 2022-12-26 DIAGNOSIS — M6281 Muscle weakness (generalized): Secondary | ICD-10-CM | POA: Diagnosis not present

## 2022-12-26 DIAGNOSIS — S72001D Fracture of unspecified part of neck of right femur, subsequent encounter for closed fracture with routine healing: Secondary | ICD-10-CM | POA: Diagnosis not present

## 2022-12-26 DIAGNOSIS — R2681 Unsteadiness on feet: Secondary | ICD-10-CM | POA: Diagnosis not present

## 2022-12-27 DIAGNOSIS — M6281 Muscle weakness (generalized): Secondary | ICD-10-CM | POA: Diagnosis not present

## 2022-12-27 DIAGNOSIS — S72001D Fracture of unspecified part of neck of right femur, subsequent encounter for closed fracture with routine healing: Secondary | ICD-10-CM | POA: Diagnosis not present

## 2022-12-27 DIAGNOSIS — R2681 Unsteadiness on feet: Secondary | ICD-10-CM | POA: Diagnosis not present

## 2022-12-30 DIAGNOSIS — S72001D Fracture of unspecified part of neck of right femur, subsequent encounter for closed fracture with routine healing: Secondary | ICD-10-CM | POA: Diagnosis not present

## 2022-12-30 DIAGNOSIS — M6281 Muscle weakness (generalized): Secondary | ICD-10-CM | POA: Diagnosis not present

## 2022-12-30 DIAGNOSIS — R2681 Unsteadiness on feet: Secondary | ICD-10-CM | POA: Diagnosis not present

## 2022-12-31 DIAGNOSIS — R2681 Unsteadiness on feet: Secondary | ICD-10-CM | POA: Diagnosis not present

## 2022-12-31 DIAGNOSIS — M6281 Muscle weakness (generalized): Secondary | ICD-10-CM | POA: Diagnosis not present

## 2022-12-31 DIAGNOSIS — S72001D Fracture of unspecified part of neck of right femur, subsequent encounter for closed fracture with routine healing: Secondary | ICD-10-CM | POA: Diagnosis not present

## 2023-01-01 DIAGNOSIS — M6281 Muscle weakness (generalized): Secondary | ICD-10-CM | POA: Diagnosis not present

## 2023-01-01 DIAGNOSIS — R2681 Unsteadiness on feet: Secondary | ICD-10-CM | POA: Diagnosis not present

## 2023-01-01 DIAGNOSIS — S72001D Fracture of unspecified part of neck of right femur, subsequent encounter for closed fracture with routine healing: Secondary | ICD-10-CM | POA: Diagnosis not present

## 2023-01-02 DIAGNOSIS — R2681 Unsteadiness on feet: Secondary | ICD-10-CM | POA: Diagnosis not present

## 2023-01-02 DIAGNOSIS — M6281 Muscle weakness (generalized): Secondary | ICD-10-CM | POA: Diagnosis not present

## 2023-01-02 DIAGNOSIS — S72001D Fracture of unspecified part of neck of right femur, subsequent encounter for closed fracture with routine healing: Secondary | ICD-10-CM | POA: Diagnosis not present

## 2023-01-03 DIAGNOSIS — S72001D Fracture of unspecified part of neck of right femur, subsequent encounter for closed fracture with routine healing: Secondary | ICD-10-CM | POA: Diagnosis not present

## 2023-01-03 DIAGNOSIS — R2681 Unsteadiness on feet: Secondary | ICD-10-CM | POA: Diagnosis not present

## 2023-01-03 DIAGNOSIS — M6281 Muscle weakness (generalized): Secondary | ICD-10-CM | POA: Diagnosis not present

## 2023-01-05 DIAGNOSIS — S72001D Fracture of unspecified part of neck of right femur, subsequent encounter for closed fracture with routine healing: Secondary | ICD-10-CM | POA: Diagnosis not present

## 2023-01-05 DIAGNOSIS — R2681 Unsteadiness on feet: Secondary | ICD-10-CM | POA: Diagnosis not present

## 2023-01-05 DIAGNOSIS — M6281 Muscle weakness (generalized): Secondary | ICD-10-CM | POA: Diagnosis not present

## 2023-01-06 DIAGNOSIS — R2681 Unsteadiness on feet: Secondary | ICD-10-CM | POA: Diagnosis not present

## 2023-01-06 DIAGNOSIS — S72001D Fracture of unspecified part of neck of right femur, subsequent encounter for closed fracture with routine healing: Secondary | ICD-10-CM | POA: Diagnosis not present

## 2023-01-06 DIAGNOSIS — M6281 Muscle weakness (generalized): Secondary | ICD-10-CM | POA: Diagnosis not present

## 2023-01-07 DIAGNOSIS — E785 Hyperlipidemia, unspecified: Secondary | ICD-10-CM | POA: Diagnosis not present

## 2023-01-07 DIAGNOSIS — K219 Gastro-esophageal reflux disease without esophagitis: Secondary | ICD-10-CM | POA: Diagnosis not present

## 2023-01-07 DIAGNOSIS — J45909 Unspecified asthma, uncomplicated: Secondary | ICD-10-CM | POA: Diagnosis not present

## 2023-01-07 DIAGNOSIS — R638 Other symptoms and signs concerning food and fluid intake: Secondary | ICD-10-CM | POA: Diagnosis not present

## 2023-01-07 DIAGNOSIS — M810 Age-related osteoporosis without current pathological fracture: Secondary | ICD-10-CM | POA: Diagnosis not present

## 2023-01-07 DIAGNOSIS — S72001D Fracture of unspecified part of neck of right femur, subsequent encounter for closed fracture with routine healing: Secondary | ICD-10-CM | POA: Diagnosis not present

## 2023-01-07 DIAGNOSIS — H409 Unspecified glaucoma: Secondary | ICD-10-CM | POA: Diagnosis not present

## 2023-01-07 DIAGNOSIS — R2681 Unsteadiness on feet: Secondary | ICD-10-CM | POA: Diagnosis not present

## 2023-01-07 DIAGNOSIS — M6281 Muscle weakness (generalized): Secondary | ICD-10-CM | POA: Diagnosis not present

## 2023-01-07 DIAGNOSIS — E039 Hypothyroidism, unspecified: Secondary | ICD-10-CM | POA: Diagnosis not present

## 2023-01-07 DIAGNOSIS — K5900 Constipation, unspecified: Secondary | ICD-10-CM | POA: Diagnosis not present

## 2023-01-07 DIAGNOSIS — D649 Anemia, unspecified: Secondary | ICD-10-CM | POA: Diagnosis not present

## 2023-01-07 DIAGNOSIS — N3281 Overactive bladder: Secondary | ICD-10-CM | POA: Diagnosis not present

## 2023-01-07 DIAGNOSIS — R1310 Dysphagia, unspecified: Secondary | ICD-10-CM | POA: Diagnosis not present

## 2023-01-08 DIAGNOSIS — S72001D Fracture of unspecified part of neck of right femur, subsequent encounter for closed fracture with routine healing: Secondary | ICD-10-CM | POA: Diagnosis not present

## 2023-01-08 DIAGNOSIS — M6281 Muscle weakness (generalized): Secondary | ICD-10-CM | POA: Diagnosis not present

## 2023-01-08 DIAGNOSIS — R2681 Unsteadiness on feet: Secondary | ICD-10-CM | POA: Diagnosis not present

## 2023-01-10 DIAGNOSIS — M6281 Muscle weakness (generalized): Secondary | ICD-10-CM | POA: Diagnosis not present

## 2023-01-10 DIAGNOSIS — R2681 Unsteadiness on feet: Secondary | ICD-10-CM | POA: Diagnosis not present

## 2023-01-10 DIAGNOSIS — S72001D Fracture of unspecified part of neck of right femur, subsequent encounter for closed fracture with routine healing: Secondary | ICD-10-CM | POA: Diagnosis not present

## 2023-01-13 DIAGNOSIS — M6281 Muscle weakness (generalized): Secondary | ICD-10-CM | POA: Diagnosis not present

## 2023-01-13 DIAGNOSIS — S72001D Fracture of unspecified part of neck of right femur, subsequent encounter for closed fracture with routine healing: Secondary | ICD-10-CM | POA: Diagnosis not present

## 2023-01-13 DIAGNOSIS — R2681 Unsteadiness on feet: Secondary | ICD-10-CM | POA: Diagnosis not present

## 2023-01-14 DIAGNOSIS — S72001D Fracture of unspecified part of neck of right femur, subsequent encounter for closed fracture with routine healing: Secondary | ICD-10-CM | POA: Diagnosis not present

## 2023-01-14 DIAGNOSIS — N390 Urinary tract infection, site not specified: Secondary | ICD-10-CM | POA: Diagnosis not present

## 2023-01-14 DIAGNOSIS — M6281 Muscle weakness (generalized): Secondary | ICD-10-CM | POA: Diagnosis not present

## 2023-01-14 DIAGNOSIS — R2681 Unsteadiness on feet: Secondary | ICD-10-CM | POA: Diagnosis not present

## 2023-01-15 DIAGNOSIS — J45909 Unspecified asthma, uncomplicated: Secondary | ICD-10-CM | POA: Diagnosis not present

## 2023-01-15 DIAGNOSIS — S72001D Fracture of unspecified part of neck of right femur, subsequent encounter for closed fracture with routine healing: Secondary | ICD-10-CM | POA: Diagnosis not present

## 2023-01-15 DIAGNOSIS — H409 Unspecified glaucoma: Secondary | ICD-10-CM | POA: Diagnosis not present

## 2023-01-15 DIAGNOSIS — R638 Other symptoms and signs concerning food and fluid intake: Secondary | ICD-10-CM | POA: Diagnosis not present

## 2023-01-15 DIAGNOSIS — R300 Dysuria: Secondary | ICD-10-CM | POA: Diagnosis not present

## 2023-01-15 DIAGNOSIS — R2681 Unsteadiness on feet: Secondary | ICD-10-CM | POA: Diagnosis not present

## 2023-01-15 DIAGNOSIS — E039 Hypothyroidism, unspecified: Secondary | ICD-10-CM | POA: Diagnosis not present

## 2023-01-15 DIAGNOSIS — K219 Gastro-esophageal reflux disease without esophagitis: Secondary | ICD-10-CM | POA: Diagnosis not present

## 2023-01-15 DIAGNOSIS — K5900 Constipation, unspecified: Secondary | ICD-10-CM | POA: Diagnosis not present

## 2023-01-15 DIAGNOSIS — I10 Essential (primary) hypertension: Secondary | ICD-10-CM | POA: Diagnosis not present

## 2023-01-15 DIAGNOSIS — M6281 Muscle weakness (generalized): Secondary | ICD-10-CM | POA: Diagnosis not present

## 2023-01-15 DIAGNOSIS — M810 Age-related osteoporosis without current pathological fracture: Secondary | ICD-10-CM | POA: Diagnosis not present

## 2023-01-15 DIAGNOSIS — R1310 Dysphagia, unspecified: Secondary | ICD-10-CM | POA: Diagnosis not present

## 2023-01-15 DIAGNOSIS — E785 Hyperlipidemia, unspecified: Secondary | ICD-10-CM | POA: Diagnosis not present

## 2023-01-16 DIAGNOSIS — S72001D Fracture of unspecified part of neck of right femur, subsequent encounter for closed fracture with routine healing: Secondary | ICD-10-CM | POA: Diagnosis not present

## 2023-01-16 DIAGNOSIS — N390 Urinary tract infection, site not specified: Secondary | ICD-10-CM | POA: Diagnosis not present

## 2023-01-16 DIAGNOSIS — M6281 Muscle weakness (generalized): Secondary | ICD-10-CM | POA: Diagnosis not present

## 2023-01-16 DIAGNOSIS — R2681 Unsteadiness on feet: Secondary | ICD-10-CM | POA: Diagnosis not present

## 2023-01-17 DIAGNOSIS — S72001D Fracture of unspecified part of neck of right femur, subsequent encounter for closed fracture with routine healing: Secondary | ICD-10-CM | POA: Diagnosis not present

## 2023-01-17 DIAGNOSIS — M6281 Muscle weakness (generalized): Secondary | ICD-10-CM | POA: Diagnosis not present

## 2023-01-17 DIAGNOSIS — R2681 Unsteadiness on feet: Secondary | ICD-10-CM | POA: Diagnosis not present

## 2023-01-20 DIAGNOSIS — I10 Essential (primary) hypertension: Secondary | ICD-10-CM | POA: Diagnosis not present

## 2023-01-20 DIAGNOSIS — D649 Anemia, unspecified: Secondary | ICD-10-CM | POA: Diagnosis not present

## 2023-01-20 DIAGNOSIS — R2681 Unsteadiness on feet: Secondary | ICD-10-CM | POA: Diagnosis not present

## 2023-01-20 DIAGNOSIS — S72001D Fracture of unspecified part of neck of right femur, subsequent encounter for closed fracture with routine healing: Secondary | ICD-10-CM | POA: Diagnosis not present

## 2023-01-20 DIAGNOSIS — M6281 Muscle weakness (generalized): Secondary | ICD-10-CM | POA: Diagnosis not present

## 2023-01-21 DIAGNOSIS — R2681 Unsteadiness on feet: Secondary | ICD-10-CM | POA: Diagnosis not present

## 2023-01-21 DIAGNOSIS — S72001D Fracture of unspecified part of neck of right femur, subsequent encounter for closed fracture with routine healing: Secondary | ICD-10-CM | POA: Diagnosis not present

## 2023-01-21 DIAGNOSIS — M6281 Muscle weakness (generalized): Secondary | ICD-10-CM | POA: Diagnosis not present

## 2023-01-22 DIAGNOSIS — R2681 Unsteadiness on feet: Secondary | ICD-10-CM | POA: Diagnosis not present

## 2023-01-22 DIAGNOSIS — M6281 Muscle weakness (generalized): Secondary | ICD-10-CM | POA: Diagnosis not present

## 2023-01-22 DIAGNOSIS — S72001D Fracture of unspecified part of neck of right femur, subsequent encounter for closed fracture with routine healing: Secondary | ICD-10-CM | POA: Diagnosis not present

## 2023-01-23 DIAGNOSIS — R2681 Unsteadiness on feet: Secondary | ICD-10-CM | POA: Diagnosis not present

## 2023-01-23 DIAGNOSIS — M6281 Muscle weakness (generalized): Secondary | ICD-10-CM | POA: Diagnosis not present

## 2023-01-23 DIAGNOSIS — S72001D Fracture of unspecified part of neck of right femur, subsequent encounter for closed fracture with routine healing: Secondary | ICD-10-CM | POA: Diagnosis not present

## 2023-01-24 DIAGNOSIS — M6281 Muscle weakness (generalized): Secondary | ICD-10-CM | POA: Diagnosis not present

## 2023-01-24 DIAGNOSIS — R2681 Unsteadiness on feet: Secondary | ICD-10-CM | POA: Diagnosis not present

## 2023-01-24 DIAGNOSIS — S72001D Fracture of unspecified part of neck of right femur, subsequent encounter for closed fracture with routine healing: Secondary | ICD-10-CM | POA: Diagnosis not present

## 2023-01-27 DIAGNOSIS — M6281 Muscle weakness (generalized): Secondary | ICD-10-CM | POA: Diagnosis not present

## 2023-01-27 DIAGNOSIS — R2681 Unsteadiness on feet: Secondary | ICD-10-CM | POA: Diagnosis not present

## 2023-01-27 DIAGNOSIS — S72001D Fracture of unspecified part of neck of right femur, subsequent encounter for closed fracture with routine healing: Secondary | ICD-10-CM | POA: Diagnosis not present

## 2023-01-28 DIAGNOSIS — S72001D Fracture of unspecified part of neck of right femur, subsequent encounter for closed fracture with routine healing: Secondary | ICD-10-CM | POA: Diagnosis not present

## 2023-01-28 DIAGNOSIS — M6281 Muscle weakness (generalized): Secondary | ICD-10-CM | POA: Diagnosis not present

## 2023-01-28 DIAGNOSIS — R2681 Unsteadiness on feet: Secondary | ICD-10-CM | POA: Diagnosis not present

## 2023-01-29 DIAGNOSIS — M6281 Muscle weakness (generalized): Secondary | ICD-10-CM | POA: Diagnosis not present

## 2023-01-29 DIAGNOSIS — S72001D Fracture of unspecified part of neck of right femur, subsequent encounter for closed fracture with routine healing: Secondary | ICD-10-CM | POA: Diagnosis not present

## 2023-01-29 DIAGNOSIS — R2681 Unsteadiness on feet: Secondary | ICD-10-CM | POA: Diagnosis not present

## 2023-01-30 DIAGNOSIS — S72001D Fracture of unspecified part of neck of right femur, subsequent encounter for closed fracture with routine healing: Secondary | ICD-10-CM | POA: Diagnosis not present

## 2023-01-30 DIAGNOSIS — M6281 Muscle weakness (generalized): Secondary | ICD-10-CM | POA: Diagnosis not present

## 2023-01-30 DIAGNOSIS — R2681 Unsteadiness on feet: Secondary | ICD-10-CM | POA: Diagnosis not present

## 2023-01-31 DIAGNOSIS — R2681 Unsteadiness on feet: Secondary | ICD-10-CM | POA: Diagnosis not present

## 2023-01-31 DIAGNOSIS — M6281 Muscle weakness (generalized): Secondary | ICD-10-CM | POA: Diagnosis not present

## 2023-01-31 DIAGNOSIS — S72001D Fracture of unspecified part of neck of right femur, subsequent encounter for closed fracture with routine healing: Secondary | ICD-10-CM | POA: Diagnosis not present

## 2023-02-02 DIAGNOSIS — R2681 Unsteadiness on feet: Secondary | ICD-10-CM | POA: Diagnosis not present

## 2023-02-02 DIAGNOSIS — S72001D Fracture of unspecified part of neck of right femur, subsequent encounter for closed fracture with routine healing: Secondary | ICD-10-CM | POA: Diagnosis not present

## 2023-02-02 DIAGNOSIS — M6281 Muscle weakness (generalized): Secondary | ICD-10-CM | POA: Diagnosis not present

## 2023-02-03 DIAGNOSIS — R2681 Unsteadiness on feet: Secondary | ICD-10-CM | POA: Diagnosis not present

## 2023-02-03 DIAGNOSIS — M6281 Muscle weakness (generalized): Secondary | ICD-10-CM | POA: Diagnosis not present

## 2023-02-03 DIAGNOSIS — S72001D Fracture of unspecified part of neck of right femur, subsequent encounter for closed fracture with routine healing: Secondary | ICD-10-CM | POA: Diagnosis not present

## 2023-02-06 DIAGNOSIS — S72001D Fracture of unspecified part of neck of right femur, subsequent encounter for closed fracture with routine healing: Secondary | ICD-10-CM | POA: Diagnosis not present

## 2023-02-06 DIAGNOSIS — E039 Hypothyroidism, unspecified: Secondary | ICD-10-CM | POA: Diagnosis not present

## 2023-02-06 DIAGNOSIS — R1310 Dysphagia, unspecified: Secondary | ICD-10-CM | POA: Diagnosis not present

## 2023-02-06 DIAGNOSIS — Z0000 Encounter for general adult medical examination without abnormal findings: Secondary | ICD-10-CM | POA: Diagnosis not present

## 2023-02-06 DIAGNOSIS — I10 Essential (primary) hypertension: Secondary | ICD-10-CM | POA: Diagnosis not present

## 2023-02-10 DIAGNOSIS — R0981 Nasal congestion: Secondary | ICD-10-CM | POA: Diagnosis not present

## 2023-02-10 DIAGNOSIS — R600 Localized edema: Secondary | ICD-10-CM | POA: Diagnosis not present

## 2023-02-10 DIAGNOSIS — I10 Essential (primary) hypertension: Secondary | ICD-10-CM | POA: Diagnosis not present

## 2023-02-10 DIAGNOSIS — R1310 Dysphagia, unspecified: Secondary | ICD-10-CM | POA: Diagnosis not present

## 2023-02-14 DIAGNOSIS — I10 Essential (primary) hypertension: Secondary | ICD-10-CM | POA: Diagnosis not present

## 2023-02-17 DIAGNOSIS — R1310 Dysphagia, unspecified: Secondary | ICD-10-CM | POA: Diagnosis not present

## 2023-02-17 DIAGNOSIS — R0981 Nasal congestion: Secondary | ICD-10-CM | POA: Diagnosis not present

## 2023-02-17 DIAGNOSIS — I10 Essential (primary) hypertension: Secondary | ICD-10-CM | POA: Diagnosis not present

## 2023-02-17 DIAGNOSIS — R600 Localized edema: Secondary | ICD-10-CM | POA: Diagnosis not present

## 2023-02-21 DIAGNOSIS — E119 Type 2 diabetes mellitus without complications: Secondary | ICD-10-CM | POA: Diagnosis not present

## 2023-02-21 DIAGNOSIS — E559 Vitamin D deficiency, unspecified: Secondary | ICD-10-CM | POA: Diagnosis not present

## 2023-02-21 DIAGNOSIS — D649 Anemia, unspecified: Secondary | ICD-10-CM | POA: Diagnosis not present

## 2023-02-24 DIAGNOSIS — Z1389 Encounter for screening for other disorder: Secondary | ICD-10-CM | POA: Diagnosis not present

## 2023-02-24 DIAGNOSIS — I10 Essential (primary) hypertension: Secondary | ICD-10-CM | POA: Diagnosis not present

## 2023-02-24 DIAGNOSIS — D649 Anemia, unspecified: Secondary | ICD-10-CM | POA: Diagnosis not present

## 2023-02-24 DIAGNOSIS — E039 Hypothyroidism, unspecified: Secondary | ICD-10-CM | POA: Diagnosis not present

## 2023-03-06 DIAGNOSIS — H18593 Other hereditary corneal dystrophies, bilateral: Secondary | ICD-10-CM | POA: Diagnosis not present

## 2023-03-06 DIAGNOSIS — H40012 Open angle with borderline findings, low risk, left eye: Secondary | ICD-10-CM | POA: Diagnosis not present

## 2023-03-06 DIAGNOSIS — H401411 Capsular glaucoma with pseudoexfoliation of lens, right eye, mild stage: Secondary | ICD-10-CM | POA: Diagnosis not present

## 2023-03-13 DIAGNOSIS — R600 Localized edema: Secondary | ICD-10-CM | POA: Diagnosis not present

## 2023-03-13 DIAGNOSIS — I10 Essential (primary) hypertension: Secondary | ICD-10-CM | POA: Diagnosis not present

## 2023-03-13 DIAGNOSIS — R0981 Nasal congestion: Secondary | ICD-10-CM | POA: Diagnosis not present

## 2023-03-13 DIAGNOSIS — R1310 Dysphagia, unspecified: Secondary | ICD-10-CM | POA: Diagnosis not present

## 2023-03-21 DIAGNOSIS — D649 Anemia, unspecified: Secondary | ICD-10-CM | POA: Diagnosis not present

## 2023-03-21 DIAGNOSIS — I10 Essential (primary) hypertension: Secondary | ICD-10-CM | POA: Diagnosis not present

## 2023-03-24 DIAGNOSIS — R300 Dysuria: Secondary | ICD-10-CM | POA: Diagnosis not present

## 2023-03-24 DIAGNOSIS — R3911 Hesitancy of micturition: Secondary | ICD-10-CM | POA: Diagnosis not present

## 2023-03-24 DIAGNOSIS — E039 Hypothyroidism, unspecified: Secondary | ICD-10-CM | POA: Diagnosis not present

## 2023-03-24 DIAGNOSIS — I10 Essential (primary) hypertension: Secondary | ICD-10-CM | POA: Diagnosis not present

## 2023-03-26 DIAGNOSIS — I5033 Acute on chronic diastolic (congestive) heart failure: Secondary | ICD-10-CM | POA: Diagnosis not present

## 2023-03-26 DIAGNOSIS — N390 Urinary tract infection, site not specified: Secondary | ICD-10-CM | POA: Diagnosis not present

## 2023-04-10 DIAGNOSIS — R1310 Dysphagia, unspecified: Secondary | ICD-10-CM | POA: Diagnosis not present

## 2023-04-10 DIAGNOSIS — I10 Essential (primary) hypertension: Secondary | ICD-10-CM | POA: Diagnosis not present

## 2023-04-10 DIAGNOSIS — R300 Dysuria: Secondary | ICD-10-CM | POA: Diagnosis not present

## 2023-04-10 DIAGNOSIS — R3911 Hesitancy of micturition: Secondary | ICD-10-CM | POA: Diagnosis not present

## 2023-04-16 DIAGNOSIS — I10 Essential (primary) hypertension: Secondary | ICD-10-CM | POA: Diagnosis not present

## 2023-04-16 DIAGNOSIS — D649 Anemia, unspecified: Secondary | ICD-10-CM | POA: Diagnosis not present

## 2023-05-01 DIAGNOSIS — S72001D Fracture of unspecified part of neck of right femur, subsequent encounter for closed fracture with routine healing: Secondary | ICD-10-CM | POA: Diagnosis not present

## 2023-05-02 DIAGNOSIS — S72001D Fracture of unspecified part of neck of right femur, subsequent encounter for closed fracture with routine healing: Secondary | ICD-10-CM | POA: Diagnosis not present

## 2023-05-05 DIAGNOSIS — I10 Essential (primary) hypertension: Secondary | ICD-10-CM | POA: Diagnosis not present

## 2023-05-05 DIAGNOSIS — S72001D Fracture of unspecified part of neck of right femur, subsequent encounter for closed fracture with routine healing: Secondary | ICD-10-CM | POA: Diagnosis not present

## 2023-05-05 DIAGNOSIS — R062 Wheezing: Secondary | ICD-10-CM | POA: Diagnosis not present

## 2023-05-05 DIAGNOSIS — N3281 Overactive bladder: Secondary | ICD-10-CM | POA: Diagnosis not present

## 2023-05-05 DIAGNOSIS — R3911 Hesitancy of micturition: Secondary | ICD-10-CM | POA: Diagnosis not present

## 2023-05-06 DIAGNOSIS — R339 Retention of urine, unspecified: Secondary | ICD-10-CM | POA: Diagnosis not present

## 2023-05-07 DIAGNOSIS — S72001D Fracture of unspecified part of neck of right femur, subsequent encounter for closed fracture with routine healing: Secondary | ICD-10-CM | POA: Diagnosis not present

## 2023-05-07 DIAGNOSIS — N390 Urinary tract infection, site not specified: Secondary | ICD-10-CM | POA: Diagnosis not present

## 2023-05-08 DIAGNOSIS — R338 Other retention of urine: Secondary | ICD-10-CM | POA: Diagnosis not present

## 2023-05-08 DIAGNOSIS — S72001D Fracture of unspecified part of neck of right femur, subsequent encounter for closed fracture with routine healing: Secondary | ICD-10-CM | POA: Diagnosis not present

## 2023-05-08 DIAGNOSIS — N3945 Continuous leakage: Secondary | ICD-10-CM | POA: Diagnosis not present

## 2023-05-09 DIAGNOSIS — S72001D Fracture of unspecified part of neck of right femur, subsequent encounter for closed fracture with routine healing: Secondary | ICD-10-CM | POA: Diagnosis not present

## 2023-05-09 DIAGNOSIS — N390 Urinary tract infection, site not specified: Secondary | ICD-10-CM | POA: Diagnosis not present

## 2023-05-13 DIAGNOSIS — S72001D Fracture of unspecified part of neck of right femur, subsequent encounter for closed fracture with routine healing: Secondary | ICD-10-CM | POA: Diagnosis not present

## 2023-05-14 DIAGNOSIS — S72001D Fracture of unspecified part of neck of right femur, subsequent encounter for closed fracture with routine healing: Secondary | ICD-10-CM | POA: Diagnosis not present

## 2023-05-15 DIAGNOSIS — S72001D Fracture of unspecified part of neck of right femur, subsequent encounter for closed fracture with routine healing: Secondary | ICD-10-CM | POA: Diagnosis not present

## 2023-05-16 DIAGNOSIS — S72001D Fracture of unspecified part of neck of right femur, subsequent encounter for closed fracture with routine healing: Secondary | ICD-10-CM | POA: Diagnosis not present

## 2023-05-16 DIAGNOSIS — I10 Essential (primary) hypertension: Secondary | ICD-10-CM | POA: Diagnosis not present

## 2023-05-17 DIAGNOSIS — S72001D Fracture of unspecified part of neck of right femur, subsequent encounter for closed fracture with routine healing: Secondary | ICD-10-CM | POA: Diagnosis not present

## 2023-05-19 DIAGNOSIS — I10 Essential (primary) hypertension: Secondary | ICD-10-CM | POA: Diagnosis not present

## 2023-05-19 DIAGNOSIS — D649 Anemia, unspecified: Secondary | ICD-10-CM | POA: Diagnosis not present

## 2023-05-19 DIAGNOSIS — S72001D Fracture of unspecified part of neck of right femur, subsequent encounter for closed fracture with routine healing: Secondary | ICD-10-CM | POA: Diagnosis not present

## 2023-05-20 DIAGNOSIS — S72001D Fracture of unspecified part of neck of right femur, subsequent encounter for closed fracture with routine healing: Secondary | ICD-10-CM | POA: Diagnosis not present

## 2023-05-22 DIAGNOSIS — S72001D Fracture of unspecified part of neck of right femur, subsequent encounter for closed fracture with routine healing: Secondary | ICD-10-CM | POA: Diagnosis not present

## 2023-05-23 DIAGNOSIS — S72001D Fracture of unspecified part of neck of right femur, subsequent encounter for closed fracture with routine healing: Secondary | ICD-10-CM | POA: Diagnosis not present

## 2023-05-26 ENCOUNTER — Other Ambulatory Visit: Payer: Self-pay

## 2023-05-26 DIAGNOSIS — Z96641 Presence of right artificial hip joint: Secondary | ICD-10-CM | POA: Diagnosis not present

## 2023-05-26 DIAGNOSIS — S72001D Fracture of unspecified part of neck of right femur, subsequent encounter for closed fracture with routine healing: Secondary | ICD-10-CM | POA: Diagnosis not present

## 2023-05-26 DIAGNOSIS — K551 Chronic vascular disorders of intestine: Secondary | ICD-10-CM

## 2023-05-26 DIAGNOSIS — S72031D Displaced midcervical fracture of right femur, subsequent encounter for closed fracture with routine healing: Secondary | ICD-10-CM | POA: Diagnosis not present

## 2023-05-27 DIAGNOSIS — S72001D Fracture of unspecified part of neck of right femur, subsequent encounter for closed fracture with routine healing: Secondary | ICD-10-CM | POA: Diagnosis not present

## 2023-05-28 DIAGNOSIS — S72001D Fracture of unspecified part of neck of right femur, subsequent encounter for closed fracture with routine healing: Secondary | ICD-10-CM | POA: Diagnosis not present

## 2023-05-29 DIAGNOSIS — S72001D Fracture of unspecified part of neck of right femur, subsequent encounter for closed fracture with routine healing: Secondary | ICD-10-CM | POA: Diagnosis not present

## 2023-05-30 DIAGNOSIS — S72001D Fracture of unspecified part of neck of right femur, subsequent encounter for closed fracture with routine healing: Secondary | ICD-10-CM | POA: Diagnosis not present

## 2023-06-02 DIAGNOSIS — S72001D Fracture of unspecified part of neck of right femur, subsequent encounter for closed fracture with routine healing: Secondary | ICD-10-CM | POA: Diagnosis not present

## 2023-06-03 ENCOUNTER — Ambulatory Visit (INDEPENDENT_AMBULATORY_CARE_PROVIDER_SITE_OTHER): Admitting: Physician Assistant

## 2023-06-03 ENCOUNTER — Ambulatory Visit (HOSPITAL_COMMUNITY)
Admission: RE | Admit: 2023-06-03 | Discharge: 2023-06-03 | Disposition: A | Discharge status: Home / Self Care | Source: Ambulatory Visit | Attending: Vascular Surgery | Admitting: Vascular Surgery

## 2023-06-03 VITALS — BP 153/73 | HR 64 | Temp 98.2°F | Resp 18 | Wt 128.0 lb

## 2023-06-03 DIAGNOSIS — K551 Chronic vascular disorders of intestine: Secondary | ICD-10-CM

## 2023-06-03 DIAGNOSIS — S72001D Fracture of unspecified part of neck of right femur, subsequent encounter for closed fracture with routine healing: Secondary | ICD-10-CM | POA: Diagnosis not present

## 2023-06-03 NOTE — Progress Notes (Signed)
 Office Note     CC:  follow up Requesting Provider:  Jeannine Milroy., MD  HPI: Megan Foster is a 89 y.o. (09-Dec-1927) female who presents for routine follow up of chronic mesenteric artery stenosis. She has remote history of balloon angioplasty of her SMA by Dr. Farrel Hones in March of 2014. She has had elevated velocities in her SMA that we have been following since. She however has been without any recurrent post prandial pain or weight loss.   Today she is here with her sister. She reports overall doing well. She did fall and break her right hip last year and she is still recovering from this. She says she had some weight loss related to her hospitalization but has almost gained all of her weight back. She continues to eat about 6 small meals a day, most of this is pureed food or liquids as she does not use her teeth. She denies any food fear or post prandial pain. No abdominal pain. No issues with nausea or vomiting. She moves her bowels without difficulty. She does currently have a foley catheter after having several recurrent UTI's. She has follow up with urology tomorrow. She is medically managed on Statin.  Past Medical History:  Diagnosis Date   Allergy    environmental per pt   Arthritis    Asthma    Dementia (HCC)    Depression    Elevated LFTs    Esophageal dysmotility    GERD (gastroesophageal reflux disease)    Glaucoma    Headache(784.0)    Hiatal hernia    Hyperplastic polyps of stomach    Hypertension    Hypothyroid    Irritable bowel syndrome    Macular degeneration    Nausea & vomiting 04/02/2012   Osteoporosis    Shingles    Sinusitis    Tachycardia    Vitamin D  deficiency     Past Surgical History:  Procedure Laterality Date   AORTOGRAM  04/16/12   COLONOSCOPY  2008   normal   ESOPHAGOGASTRODUODENOSCOPY  2014   multiple    ESOPHAGOGASTRODUODENOSCOPY Left 01/30/2019   Procedure: ESOPHAGOGASTRODUODENOSCOPY (EGD);  Surgeon: Annis Kinder, DO;   Location: [facility];  Service: Gastroenterology;  Laterality: Left;   FOREIGN BODY REMOVAL  01/30/2019   Procedure: FOREIGN BODY REMOVAL;  Surgeon: Annis Kinder, DO;  Location: [facility];  Service: Gastroenterology;;   HIP ARTHROPLASTY Right 05/26/2022   Procedure: ARTHROPLASTY BIPOLAR HIP (HEMIARTHROPLASTY);  Surgeon: Adonica Hoose, MD;  Location: [facility];  Service: Orthopedics;  Laterality: Right;   MULTIPLE TOOTH EXTRACTIONS     TOTAL ABDOMINAL HYSTERECTOMY  1999   UPPER GASTROINTESTINAL ENDOSCOPY      Social History   Socioeconomic History   Marital status: Widowed    Spouse name: Not on file   Number of children: 1   Years of education: Not on file   Highest education level: Not on file  Occupational History   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, civil (consulting)  Tobacco Use   Smoking status: Never   Smokeless tobacco: Never  Vaping Use   Vaping status: Never Used  Substance and Sexual Activity   Alcohol  use: No    Alcohol /week: 0.0 standard drinks of alcohol     Comment: <1 day   Drug use: No   Sexual activity: Not on file  Other Topics Concern   Not on file  Social History Narrative   Not on file   Social Drivers  of Health   Financial Resource Strain: Not on file  Food Insecurity: No Food Insecurity (05/25/2022)   Hunger Vital Sign    Worried About Running Out of Food in the Last Year: Never true    Ran Out of Food in the Last Year: Never true  Transportation Needs: No Transportation Needs (05/25/2022)   PRAPARE - Administrator, Civil Service (Medical): No    Lack of Transportation (Non-Medical): No  Physical Activity: Not on file  Stress: Not on file  Social Connections: Not on file  Intimate Partner Violence: Not At Risk (05/25/2022)   Humiliation, Afraid, Rape, and Kick questionnaire    Fear of Current or Ex-Partner: No    Emotionally Abused: No    Physically Abused: No    Sexually Abused: No    Family History   Problem Relation Age of Onset   Hypertension Mother    Heart attack Mother    Heart attack Father 44   Hypertension Father    Peripheral vascular disease Father    Heart failure Sister    Cancer Sister    Hypertension Brother    Cancer Brother    Hypertension Brother    Breast cancer Sister    Ovarian cancer Other        mat cousin   Prostate cancer Maternal Uncle    Colon polyps Sister        siblings   Colon cancer Other        Maternal Aunt x3 Maternal Uncle x 2   Esophageal cancer Brother        died at 73   Kidney disease Brother    Stomach cancer Neg Hx    Rectal cancer Neg Hx     Current Outpatient Medications  Medication Sig Dispense Refill   acetaminophen  (TYLENOL ) 500 MG tablet Take 1,000 mg by mouth in the morning and at bedtime.     atorvastatin  (LIPITOR) 20 MG tablet Take 20 mg by mouth at bedtime.     dorzolamide -timolol  (COSOPT ) 2-0.5 % ophthalmic solution Place 1 drop into the right eye 2 (two) times daily.     fexofenadine (ALLEGRA) 180 MG tablet Take 180 mg by mouth every morning.     fluticasone  (FLONASE ) 50 MCG/ACT nasal spray Place 2 sprays into the nose daily. 1 g 0   HYDROcodone -acetaminophen  (NORCO/VICODIN) 5-325 MG tablet Take 1 tablet by mouth every 4 (four) hours as needed for moderate pain or severe pain. 42 tablet 0   ipratropium (ATROVENT ) 0.03 % nasal spray Place 2 sprays into both nostrils daily.     latanoprost  (XALATAN ) 0.005 % ophthalmic solution Place 1 drop into both eyes at bedtime.     levocetirizine (XYZAL) 5 MG tablet Take 5 mg by mouth every evening.     levothyroxine  (SYNTHROID , LEVOTHROID) 88 MCG tablet Take 88 mcg by mouth daily before breakfast. *BRAND NAME ONLY     metoprolol  succinate (TOPROL -XL) 50 MG 24 hr tablet TAKE ONE TABLET BY MOUTH ONE TIME DAILY (Patient taking differently: Take 50 mg by mouth daily.) 30 tablet 5   montelukast  (SINGULAIR ) 10 MG tablet Take 10 mg by mouth at bedtime.     Multiple Vitamins-Minerals  (PRESERVISION AREDS 2 PO) Take 1 tablet by mouth 2 (two) times daily.      pantoprazole  (PROTONIX ) 40 MG tablet TAKE 1 TABLET BY MOUTH 2 TIMES DAILY (Patient taking differently: Take 40 mg by mouth 2 (two) times daily.) 180 tablet 4   sodium  chloride (MURO 128) 2 % ophthalmic solution Place 1 drop into both eyes in the morning, at noon, in the evening, and at bedtime.     sodium chloride  (MURO 128) 5 % ophthalmic ointment Place 1 Application into both eyes at bedtime.     triamcinolone (NASACORT ALLERGY 24HR) 55 MCG/ACT AERO nasal inhaler Place 2 sprays into the nose daily.     triamcinolone cream (KENALOG) 0.5 % Apply 1 Application topically daily as needed (for irritation on hands).     No current facility-administered medications for this visit.    No Known Allergies   REVIEW OF SYSTEMS:   [X]  denotes positive finding, [ ]  denotes negative finding Cardiac  Comments:  Chest pain or chest pressure:    Shortness of breath upon exertion:    Short of breath when lying flat:    Irregular heart rhythm:        Vascular    Pain in calf, thigh, or hip brought on by ambulation:    Pain in feet at night that wakes you up from your sleep:     Blood clot in your veins:    Leg swelling:         Pulmonary    Oxygen  at home:    Productive cough:     Wheezing:  X Feels related to seasonal allergies      Neurologic    Sudden weakness in arms or legs:     Sudden numbness in arms or legs:     Sudden onset of difficulty speaking or slurred speech:    Temporary loss of vision in one eye:     Problems with dizziness:         Gastrointestinal    Blood in stool:     Vomited blood:         Genitourinary    Burning when urinating:     Blood in urine:        Psychiatric    Major depression:         Hematologic    Bleeding problems:    Problems with blood clotting too easily:        Skin    Rashes or ulcers:        Constitutional    Fever or chills:      PHYSICAL  EXAMINATION:  Vitals:   06/03/23 1112  BP: (!) 153/73  Pulse: 64  Resp: 18  Temp: 98.2 F (36.8 C)  TempSrc: Temporal  SpO2: 96%  Weight: 128 lb (58.1 kg)    General:  WDWN in NAD; vital signs documented above Gait: Not observed, in wheel chair HENT: WNL, normocephalic Pulmonary: normal non-labored breathing , diminished bilateral lower lobes Cardiac: regular HR Abdomen: soft, NT, no masses Vascular Exam/Pulses: 2+ radial pulses, 2+ DP pulses bilaterally Extremities: without ischemic changes, without Gangrene , without cellulitis; without open wounds;  Musculoskeletal: no muscle wasting or atrophy  Neurologic: A&O X 3 Psychiatric:  The pt has Normal affect.   Non-Invasive Vascular Imaging:   Mesenteric Duplex: Summary:  Mesenteric: Normal Superior Mesenteric artery findings. 70 to 99% stenosis in the celiac artery.  IMA appears occluded by color and pulsed wave Doppler.    ASSESSMENT/PLAN:: 89 y.o. female here for routine follow up of chronic mesenteric artery stenosis. She has remote history of balloon angioplasty of her SMA by Dr. Farrel Hones in March of 2014. She has had elevated velocities in her SMA that we have been following since. She however has been without  any recurrent post prandial pain, food fear or weight loss.  - Duplex today overall remains stable from prior studies. Proximal SMA velocities are 279 slightly up from prior study in 2023 - She remains asymptomatic at this time so no indication for intervention - She can follow up again in 1 year with repeat mesenteric duplex - She will call/ return to office sooner if she develops any new or worsening symptoms    Deneen Finical, PA-C [facility] [phone]  Clinic MD:   [place] [facility]

## 2023-06-04 DIAGNOSIS — R338 Other retention of urine: Secondary | ICD-10-CM | POA: Diagnosis not present

## 2023-06-04 DIAGNOSIS — S72001D Fracture of unspecified part of neck of right femur, subsequent encounter for closed fracture with routine healing: Secondary | ICD-10-CM | POA: Diagnosis not present

## 2023-06-05 DIAGNOSIS — S72001D Fracture of unspecified part of neck of right femur, subsequent encounter for closed fracture with routine healing: Secondary | ICD-10-CM | POA: Diagnosis not present

## 2023-06-06 DIAGNOSIS — S72001D Fracture of unspecified part of neck of right femur, subsequent encounter for closed fracture with routine healing: Secondary | ICD-10-CM | POA: Diagnosis not present

## 2023-06-09 DIAGNOSIS — S72001D Fracture of unspecified part of neck of right femur, subsequent encounter for closed fracture with routine healing: Secondary | ICD-10-CM | POA: Diagnosis not present

## 2023-06-10 DIAGNOSIS — S72001D Fracture of unspecified part of neck of right femur, subsequent encounter for closed fracture with routine healing: Secondary | ICD-10-CM | POA: Diagnosis not present

## 2023-06-11 DIAGNOSIS — K219 Gastro-esophageal reflux disease without esophagitis: Secondary | ICD-10-CM | POA: Diagnosis not present

## 2023-06-11 DIAGNOSIS — S72001D Fracture of unspecified part of neck of right femur, subsequent encounter for closed fracture with routine healing: Secondary | ICD-10-CM | POA: Diagnosis not present

## 2023-06-11 DIAGNOSIS — R062 Wheezing: Secondary | ICD-10-CM | POA: Diagnosis not present

## 2023-06-11 DIAGNOSIS — N3281 Overactive bladder: Secondary | ICD-10-CM | POA: Diagnosis not present

## 2023-06-11 DIAGNOSIS — I10 Essential (primary) hypertension: Secondary | ICD-10-CM | POA: Diagnosis not present

## 2023-06-11 DIAGNOSIS — L89611 Pressure ulcer of right heel, stage 1: Secondary | ICD-10-CM | POA: Diagnosis not present

## 2023-06-12 DIAGNOSIS — N3281 Overactive bladder: Secondary | ICD-10-CM | POA: Diagnosis not present

## 2023-06-12 DIAGNOSIS — S72001D Fracture of unspecified part of neck of right femur, subsequent encounter for closed fracture with routine healing: Secondary | ICD-10-CM | POA: Diagnosis not present

## 2023-06-12 DIAGNOSIS — R062 Wheezing: Secondary | ICD-10-CM | POA: Diagnosis not present

## 2023-06-12 DIAGNOSIS — L89611 Pressure ulcer of right heel, stage 1: Secondary | ICD-10-CM | POA: Diagnosis not present

## 2023-06-12 DIAGNOSIS — R339 Retention of urine, unspecified: Secondary | ICD-10-CM | POA: Diagnosis not present

## 2023-06-13 DIAGNOSIS — N312 Flaccid neuropathic bladder, not elsewhere classified: Secondary | ICD-10-CM | POA: Diagnosis not present

## 2023-06-13 DIAGNOSIS — S72001D Fracture of unspecified part of neck of right femur, subsequent encounter for closed fracture with routine healing: Secondary | ICD-10-CM | POA: Diagnosis not present

## 2023-06-13 DIAGNOSIS — R338 Other retention of urine: Secondary | ICD-10-CM | POA: Diagnosis not present

## 2023-06-16 DIAGNOSIS — E559 Vitamin D deficiency, unspecified: Secondary | ICD-10-CM | POA: Diagnosis not present

## 2023-06-16 DIAGNOSIS — R339 Retention of urine, unspecified: Secondary | ICD-10-CM | POA: Diagnosis not present

## 2023-06-16 DIAGNOSIS — N3281 Overactive bladder: Secondary | ICD-10-CM | POA: Diagnosis not present

## 2023-06-16 DIAGNOSIS — I10 Essential (primary) hypertension: Secondary | ICD-10-CM | POA: Diagnosis not present

## 2023-06-16 DIAGNOSIS — S72001D Fracture of unspecified part of neck of right femur, subsequent encounter for closed fracture with routine healing: Secondary | ICD-10-CM | POA: Diagnosis not present

## 2023-06-16 DIAGNOSIS — L89611 Pressure ulcer of right heel, stage 1: Secondary | ICD-10-CM | POA: Diagnosis not present

## 2023-06-17 DIAGNOSIS — S72001D Fracture of unspecified part of neck of right femur, subsequent encounter for closed fracture with routine healing: Secondary | ICD-10-CM | POA: Diagnosis not present

## 2023-06-18 DIAGNOSIS — S72001D Fracture of unspecified part of neck of right femur, subsequent encounter for closed fracture with routine healing: Secondary | ICD-10-CM | POA: Diagnosis not present

## 2023-06-19 DIAGNOSIS — S72001D Fracture of unspecified part of neck of right femur, subsequent encounter for closed fracture with routine healing: Secondary | ICD-10-CM | POA: Diagnosis not present

## 2023-06-20 DIAGNOSIS — S72001D Fracture of unspecified part of neck of right femur, subsequent encounter for closed fracture with routine healing: Secondary | ICD-10-CM | POA: Diagnosis not present

## 2023-06-23 DIAGNOSIS — N3281 Overactive bladder: Secondary | ICD-10-CM | POA: Diagnosis not present

## 2023-06-23 DIAGNOSIS — L89611 Pressure ulcer of right heel, stage 1: Secondary | ICD-10-CM | POA: Diagnosis not present

## 2023-06-23 DIAGNOSIS — B3731 Acute candidiasis of vulva and vagina: Secondary | ICD-10-CM | POA: Diagnosis not present

## 2023-06-23 DIAGNOSIS — R339 Retention of urine, unspecified: Secondary | ICD-10-CM | POA: Diagnosis not present

## 2023-06-23 DIAGNOSIS — S72001D Fracture of unspecified part of neck of right femur, subsequent encounter for closed fracture with routine healing: Secondary | ICD-10-CM | POA: Diagnosis not present

## 2023-06-24 ENCOUNTER — Ambulatory Visit: Admitting: Pulmonary Disease

## 2023-06-24 ENCOUNTER — Encounter: Payer: Self-pay | Admitting: Pulmonary Disease

## 2023-06-24 VITALS — BP 124/58 | HR 69 | Ht 65.0 in | Wt 125.0 lb

## 2023-06-24 DIAGNOSIS — S72001D Fracture of unspecified part of neck of right femur, subsequent encounter for closed fracture with routine healing: Secondary | ICD-10-CM | POA: Diagnosis not present

## 2023-06-24 DIAGNOSIS — R9389 Abnormal findings on diagnostic imaging of other specified body structures: Secondary | ICD-10-CM | POA: Diagnosis not present

## 2023-06-24 DIAGNOSIS — J984 Other disorders of lung: Secondary | ICD-10-CM

## 2023-06-24 DIAGNOSIS — J45909 Unspecified asthma, uncomplicated: Secondary | ICD-10-CM

## 2023-06-24 DIAGNOSIS — R0600 Dyspnea, unspecified: Secondary | ICD-10-CM

## 2023-06-24 NOTE — Patient Instructions (Signed)
 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 We also reviewed your goals of care, emphasizing conservative management to maintain your quality of life.  YOUR PLAN:  -CHRONIC LUNG CHANGES WITH GRANULOMA: Chronic lung changes, including a granuloma, were identified on your chest x-ray. A granuloma is a small area of inflammation in the lung. Given your history and the benign nature of these changes, we will proceed with a CT scan to further evaluate the lung changes. We will manage this conservatively unless significant findings are present.  -ASTHMA, UNSPECIFIED: Your asthma is well-controlled, and you have not had any attacks in the past five years. Asthma is a condition where your airways narrow and swell, producing extra mucus. Continue using Spiriva and Singulair  as prescribed, and use your albuterol inhaler as needed for symptoms. Consider using over-the-counter antihistamines like Zyrtec or Claritin  for allergy symptoms.   -GOALS OF CARE: We discussed your preference for conservative management, focusing on treatments that are easily manageable and do not cause significant side effects. Your goal is to maintain your quality of life rather than pursue aggressive treatments.  INSTRUCTIONS:  Please schedule a CT scan at [facility] as soon as possible. Follow up with us  in 2-3 months for reassessment.

## 2023-06-24 NOTE — Progress Notes (Addendum)
 Megan Foster    [id number]    [birth year]  Primary Care Physician:Shaw, Gerarda Knights., MD  Referring Physician: Jeannine Milroy., MD [address] [place],  [place] [address]  Chief complaint: Consult for abnormal chest x-ray  HPI: 89 y.o. who  has a past medical history of Allergy, Arthritis, Asthma, Dementia (HCC), Depression, Elevated LFTs, Esophageal dysmotility, GERD (gastroesophageal reflux disease), Glaucoma, Headache(784.0), Hiatal hernia, Hyperplastic polyps of stomach, Hypertension, Hypothyroid, Irritable bowel syndrome, Macular degeneration, Nausea & vomiting (04/02/2012), Osteoporosis, Shingles, Sinusitis, Tachycardia, and Vitamin D  deficiency.  Discussed the use of AI scribe software for clinical note transcription with the patient, who gave verbal consent to proceed.  History of Present Illness Megan Foster is a 89 year old female who presents with concerns about chronic lung changes seen on chest x-ray.  A recent chest x-ray revealed a spot in her lung, described as a granuloma, along with linear opacities and atelectasis. Previous x-rays in March and July 2024 also showed a similar spot. She recalls a similar finding when she was 19, which was deemed non-concerning at the time. She is uncertain if the current findings are new or chronic.  She has a history of asthma, which she describes as well-controlled. She uses a spirometer daily and takes Flonase , Mucinex, and Robitussin for respiratory symptoms. She also uses Spiriva and has an albuterol inhaler, though she has not used it recently. No asthma attacks in approximately five years. She has allergies, particularly to pollen, which exacerbate her symptoms. No recent bronchitis or asthma exacerbations, but she reports occasional wheezing.  She recently sustained a hip fracture and underwent hip replacement surgery. She is currently using a walker with assistance for mobility and has a Foley catheter due to  bladder issues post-surgery, with her bladder not functioning properly. There is a possibility of needing a suprapubic catheter.  [place] having lived in [place]  and [place] . 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 She currently lives alone after the passing of her husband and daughter, with her sister nearby. She does not smoke and has no pets.    Pets: No pets Occupation: Retired Charity fundraiser, Production designer, theatre/television/film Exposures: No mold, hot tub, Financial controller.  No feather pillows or comforters No h/o chemo/XRT/amiodarone/macrodantin/MTX  No exposure to asbestos, silica or other organic allergens  Smoking history: Never smoker Travel history: Previously lived in [place] .  No significant recent travel Relevant family history: No family history of lung disease   Outpatient Encounter Medications as of 06/24/2023  Medication Sig   acetaminophen  (TYLENOL ) 500 MG tablet Take 1,000 mg by mouth in the morning and at bedtime.   atorvastatin  (LIPITOR) 10 MG tablet Take 10 mg by mouth daily.   dorzolamide -timolol  (COSOPT ) 2-0.5 % ophthalmic solution Place 1 drop into the right eye 2 (two) times daily.   HYDROcodone -acetaminophen  (NORCO/VICODIN) 5-325 MG tablet Take 1 tablet by mouth every 4 (four) hours as needed for moderate pain or severe pain.   latanoprost  (XALATAN ) 0.005 % ophthalmic solution Place 1 drop into both eyes at bedtime.   levothyroxine  (SYNTHROID , LEVOTHROID) 88 MCG tablet Take 88 mcg by mouth daily before breakfast. *BRAND NAME ONLY   metoprolol  succinate (TOPROL -XL) 50 MG 24 hr tablet TAKE ONE TABLET BY MOUTH ONE TIME DAILY (Patient taking differently: Take 50 mg by mouth daily.)   MICONAZOLE 3 200 MG vaginal suppository  Place 200 mg vaginally at bedtime.   montelukast  (SINGULAIR ) 10 MG tablet Take 10 mg by mouth at bedtime.   Multiple Vitamins-Minerals (PRESERVISION AREDS 2 PO) Take 1 tablet by  mouth 2 (two) times daily.    ondansetron  (ZOFRAN ) 4 MG tablet Take 4 mg by mouth every 8 (eight) hours as needed for nausea or vomiting.   pantoprazole  (PROTONIX ) 40 MG tablet TAKE 1 TABLET BY MOUTH 2 TIMES DAILY (Patient taking differently: Take 40 mg by mouth 2 (two) times daily.)   sodium chloride  (MURO 128) 2 % ophthalmic solution Place 1 drop into both eyes in the morning, at noon, in the evening, and at bedtime.   sodium chloride  (MURO 128) 5 % ophthalmic ointment Place 1 Application into both eyes at bedtime.   tamsulosin (FLOMAX) 0.4 MG CAPS capsule Take 0.8 mg by mouth daily.   triamcinolone (NASACORT ALLERGY 24HR) 55 MCG/ACT AERO nasal inhaler Place 2 sprays into the nose daily.   triamcinolone cream (KENALOG) 0.5 % Apply 1 Application topically daily as needed (for irritation on hands).   atorvastatin  (LIPITOR) 20 MG tablet Take 20 mg by mouth at bedtime. (Patient not taking: Reported on 06/24/2023)   fexofenadine (ALLEGRA) 180 MG tablet Take 180 mg by mouth every morning. (Patient not taking: Reported on 06/24/2023)   fluticasone  (FLONASE ) 50 MCG/ACT nasal spray Place 2 sprays into the nose daily.   ipratropium (ATROVENT ) 0.03 % nasal spray Place 2 sprays into both nostrils daily. (Patient not taking: Reported on 06/24/2023)   levocetirizine (XYZAL) 5 MG tablet Take 5 mg by mouth every evening. (Patient not taking: Reported on 06/24/2023)   No facility-administered encounter medications on file as of 06/24/2023.    Allergies as of 06/24/2023   (No Known Allergies)    Past Medical History:  Diagnosis Date   Allergy    environmental per pt   Arthritis    Asthma    Dementia (HCC)    Depression    Elevated LFTs    Esophageal dysmotility    GERD (gastroesophageal reflux disease)    Glaucoma    Headache(784.0)    Hiatal hernia    Hyperplastic polyps of stomach    Hypertension    Hypothyroid    Irritable bowel syndrome    Macular degeneration    Nausea & vomiting 04/02/2012    Osteoporosis    Shingles    Sinusitis    Tachycardia    Vitamin D  deficiency     Past Surgical History:  Procedure Laterality Date   AORTOGRAM  04/16/12   COLONOSCOPY  2008   normal   ESOPHAGOGASTRODUODENOSCOPY  2014   multiple    ESOPHAGOGASTRODUODENOSCOPY Left 01/30/2019   Procedure: ESOPHAGOGASTRODUODENOSCOPY (EGD);  Surgeon: Annis Kinder, DO;  Location: [facility];  Service: Gastroenterology;  Laterality: Left;   FOREIGN BODY REMOVAL  01/30/2019   Procedure: FOREIGN BODY REMOVAL;  Surgeon: Annis Kinder, DO;  Location: [facility];  Service: Gastroenterology;;   HIP ARTHROPLASTY Right 05/26/2022   Procedure: ARTHROPLASTY BIPOLAR HIP (HEMIARTHROPLASTY);  Surgeon: Adonica Hoose, MD;  Location: [facility];  Service: Orthopedics;  Laterality: Right;   MULTIPLE TOOTH EXTRACTIONS     TOTAL ABDOMINAL HYSTERECTOMY  1999   UPPER GASTROINTESTINAL ENDOSCOPY      Family History  Problem Relation Age of Onset   Hypertension Mother    Heart attack Mother    Heart attack Father 106   Hypertension Father    Peripheral vascular disease Father    Heart failure Sister  Cancer Sister    Hypertension Brother    Cancer Brother    Hypertension Brother    Breast cancer Sister    Ovarian cancer Other        mat cousin   Prostate cancer Maternal Uncle    Colon polyps Sister        siblings   Colon cancer Other        Maternal Aunt x3 Maternal Uncle x 2   Esophageal cancer Brother        died at 86   Kidney disease Brother    Stomach cancer Neg Hx    Rectal cancer Neg Hx     Social History   Socioeconomic History   Marital status: Widowed    Spouse name: Not on file   Number of children: 1   Years of education: Not on file   Highest education level: Not on file  Occupational History   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, civil (consulting)  Tobacco Use   Smoking status: Never   Smokeless tobacco: Never  Vaping Use   Vaping status: Never Used   Substance and Sexual Activity   Alcohol  use: No    Alcohol /week: 0.0 standard drinks of alcohol     Comment: <1 day   Drug use: No   Sexual activity: Not on file  Other Topics Concern   Not on file  Social History Narrative   Not on file   Social Drivers of Health   Financial Resource Strain: Not on file  Food Insecurity: No Food Insecurity (05/25/2022)   Hunger Vital Sign    Worried About Running Out of Food in the Last Year: Never true    Ran Out of Food in the Last Year: Never true  Transportation Needs: No Transportation Needs (05/25/2022)   PRAPARE - Administrator, Civil Service (Medical): No    Lack of Transportation (Non-Medical): No  Physical Activity: Not on file  Stress: Not on file  Social Connections: Not on file  Intimate Partner Violence: Not At Risk (05/25/2022)   Humiliation, Afraid, Rape, and Kick questionnaire    Fear of Current or Ex-Partner: No    Emotionally Abused: No    Physically Abused: No    Sexually Abused: No    Review of systems: Review of Systems  Constitutional: Negative for fever and chills.  HENT: Negative.   Eyes: Negative for blurred vision.  Respiratory: as per HPI  Cardiovascular: Negative for chest pain and palpitations.  Gastrointestinal: Negative for vomiting, diarrhea, blood per rectum. Genitourinary: Negative for dysuria, urgency, frequency and hematuria.  Musculoskeletal: Negative for myalgias, back pain and joint pain.  Skin: Negative for itching and rash.  Neurological: Negative for dizziness, tremors, focal weakness, seizures and loss of consciousness.  Endo/Heme/Allergies: Negative for environmental allergies.  Psychiatric/Behavioral: Negative for depression, suicidal ideas and hallucinations.  All other systems reviewed and are negative.  Physical Exam: Blood pressure (!) 124/58, pulse 69, height 5\' 5"  (1.651 m), weight 125 lb (56.7 kg), SpO2 98%. Gen:      No acute distress HEENT:  EOMI, sclera  anicteric Neck:     No masses; no thyromegaly Lungs:    Clear to auscultation bilaterally; normal respiratory effort CV:         Regular rate and rhythm; no murmurs Abd:      + bowel sounds; soft, non-tender; no palpable masses, no distension Ext:    No edema; adequate peripheral perfusion Skin:  Warm and dry; no rash Neuro: alert and oriented x 3 Psych: normal mood and affect  Data Reviewed: Imaging: Chest x-ray report from primary care 06/11/2023 Compared to previous x-rays on 08/26/2022 and 05/05/2023 Small right lung granuloma, mild bibasal atelectasis, chronic linear prominence with no acute change.  PFTs:  Labs: Report from primary care CMP 06/16/2023-normal hepatic panel, BUN/creatinine 16/0.78 CMP 05/16/2023-WBC 5.2, hemoglobin 11.1, platelets 181  Assessment & Plan Chronic lung changes with granuloma Chronic lung changes identified on chest x-ray, including granuloma, linear opacities, and atelectasis. Similar findings have been present since at least 2024 and previously deemed non-cancerous. She has no smoking history. Current symptoms include rhinorrhea and cough, likely due to allergies. Discussed the benign nature of chronic changes and the risks of aggressive treatment outweighing benefits at her current age. A CT scan is planned for further evaluation, with conservative management unless significant findings are present. - Order CT scan to further evaluate lung changes.  Asthma, unspecified Asthma is well-controlled with no attacks in the past five years. She uses Singulair , with albuterol available as a rescue inhaler. Used spiriva in the past. Symptoms of wheezing and cough are likely exacerbated by allergies and pollen exposure. - Continue current asthma management with Singulair . - Use albuterol inhaler as needed for asthma symptoms. - Consider over-the-counter antihistamines like Zyrtec or Claritin  for allergy symptoms.  Fractured hip Urinary retention, neurogenic  bladder Status post hip replacement and leg repair. She is currently unable to walk independently and uses a walker with assistance. 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 Management includes eye drops and medications.  Goals of Care She prefers conservative management and is not interested in aggressive or heroic interventions. She is open to treatments that are easily manageable and do not cause significant side effects, emphasizing quality of life over life-extending measures.  Follow-up Follow-up plan discussed for further evaluation and management. - Schedule CT scan at [facility]. - Follow up in 2-3 months for reassessment.   Recommendations: CT chest  Phyllis Breeze MD [facility] 06/24/2023, 3:58 PM  CC: Jeannine Milroy., MD

## 2023-06-25 DIAGNOSIS — S72001D Fracture of unspecified part of neck of right femur, subsequent encounter for closed fracture with routine healing: Secondary | ICD-10-CM | POA: Diagnosis not present

## 2023-06-26 DIAGNOSIS — S72001D Fracture of unspecified part of neck of right femur, subsequent encounter for closed fracture with routine healing: Secondary | ICD-10-CM | POA: Diagnosis not present

## 2023-06-27 ENCOUNTER — Ambulatory Visit
Admission: RE | Admit: 2023-06-27 | Discharge: 2023-06-27 | Disposition: A | Discharge status: Home / Self Care | Source: Ambulatory Visit | Attending: Pulmonary Disease | Admitting: Pulmonary Disease

## 2023-06-27 DIAGNOSIS — R0600 Dyspnea, unspecified: Secondary | ICD-10-CM

## 2023-06-27 DIAGNOSIS — S72001D Fracture of unspecified part of neck of right femur, subsequent encounter for closed fracture with routine healing: Secondary | ICD-10-CM | POA: Diagnosis not present

## 2023-06-27 DIAGNOSIS — R0602 Shortness of breath: Secondary | ICD-10-CM | POA: Diagnosis not present

## 2023-06-27 DIAGNOSIS — J479 Bronchiectasis, uncomplicated: Secondary | ICD-10-CM | POA: Diagnosis not present

## 2023-06-30 DIAGNOSIS — I10 Essential (primary) hypertension: Secondary | ICD-10-CM | POA: Diagnosis not present

## 2023-06-30 DIAGNOSIS — S72001D Fracture of unspecified part of neck of right femur, subsequent encounter for closed fracture with routine healing: Secondary | ICD-10-CM | POA: Diagnosis not present

## 2023-06-30 DIAGNOSIS — D649 Anemia, unspecified: Secondary | ICD-10-CM | POA: Diagnosis not present

## 2023-07-01 DIAGNOSIS — S72001D Fracture of unspecified part of neck of right femur, subsequent encounter for closed fracture with routine healing: Secondary | ICD-10-CM | POA: Diagnosis not present

## 2023-07-02 DIAGNOSIS — S72001D Fracture of unspecified part of neck of right femur, subsequent encounter for closed fracture with routine healing: Secondary | ICD-10-CM | POA: Diagnosis not present

## 2023-07-03 DIAGNOSIS — S72001D Fracture of unspecified part of neck of right femur, subsequent encounter for closed fracture with routine healing: Secondary | ICD-10-CM | POA: Diagnosis not present

## 2023-07-04 DIAGNOSIS — N3289 Other specified disorders of bladder: Secondary | ICD-10-CM | POA: Diagnosis not present

## 2023-07-04 DIAGNOSIS — R339 Retention of urine, unspecified: Secondary | ICD-10-CM | POA: Diagnosis not present

## 2023-07-04 DIAGNOSIS — S72001D Fracture of unspecified part of neck of right femur, subsequent encounter for closed fracture with routine healing: Secondary | ICD-10-CM | POA: Diagnosis not present

## 2023-07-04 DIAGNOSIS — N3281 Overactive bladder: Secondary | ICD-10-CM | POA: Diagnosis not present

## 2023-07-05 DIAGNOSIS — R300 Dysuria: Secondary | ICD-10-CM | POA: Diagnosis not present

## 2023-07-07 DIAGNOSIS — N3281 Overactive bladder: Secondary | ICD-10-CM | POA: Diagnosis not present

## 2023-07-07 DIAGNOSIS — S72001D Fracture of unspecified part of neck of right femur, subsequent encounter for closed fracture with routine healing: Secondary | ICD-10-CM | POA: Diagnosis not present

## 2023-07-07 DIAGNOSIS — N3289 Other specified disorders of bladder: Secondary | ICD-10-CM | POA: Diagnosis not present

## 2023-07-09 DIAGNOSIS — R339 Retention of urine, unspecified: Secondary | ICD-10-CM | POA: Diagnosis not present

## 2023-07-09 DIAGNOSIS — N3289 Other specified disorders of bladder: Secondary | ICD-10-CM | POA: Diagnosis not present

## 2023-07-09 DIAGNOSIS — S72001D Fracture of unspecified part of neck of right femur, subsequent encounter for closed fracture with routine healing: Secondary | ICD-10-CM | POA: Diagnosis not present

## 2023-07-09 DIAGNOSIS — N3281 Overactive bladder: Secondary | ICD-10-CM | POA: Diagnosis not present

## 2023-07-09 DIAGNOSIS — N390 Urinary tract infection, site not specified: Secondary | ICD-10-CM | POA: Diagnosis not present

## 2023-07-10 ENCOUNTER — Telehealth: Payer: Self-pay

## 2023-07-10 DIAGNOSIS — S72001D Fracture of unspecified part of neck of right femur, subsequent encounter for closed fracture with routine healing: Secondary | ICD-10-CM | POA: Diagnosis not present

## 2023-07-10 DIAGNOSIS — I10 Essential (primary) hypertension: Secondary | ICD-10-CM | POA: Diagnosis not present

## 2023-07-10 DIAGNOSIS — R339 Retention of urine, unspecified: Secondary | ICD-10-CM | POA: Diagnosis not present

## 2023-07-10 DIAGNOSIS — N3281 Overactive bladder: Secondary | ICD-10-CM | POA: Diagnosis not present

## 2023-07-10 DIAGNOSIS — N3289 Other specified disorders of bladder: Secondary | ICD-10-CM | POA: Diagnosis not present

## 2023-07-10 NOTE — Telephone Encounter (Unsigned)
 Copied from CRM [id number]. Topic: Clinical - Medical Advice >> Jul 09, 2023  4:06 PM Hilton Lucky wrote: Reason for CRM: Megan Foster with [facility] is calling to inquire. AVS states she should continue Spiriva and Singulair  but patient has not been taking Spiriva since she has been with them and would like to know if she needs to start taking it. States an order should be sent to F: [phone] or called in at P: [phone].

## 2023-07-11 DIAGNOSIS — S72001D Fracture of unspecified part of neck of right femur, subsequent encounter for closed fracture with routine healing: Secondary | ICD-10-CM | POA: Diagnosis not present

## 2023-07-11 NOTE — Telephone Encounter (Signed)
 Megan Foster with [facility] is aware of below message and voiced her understanding.  Nothing further needed.

## 2023-07-11 NOTE — Telephone Encounter (Signed)
 She has not been taking spiriva for a while now so there is not need to restart as her symptoms are controlled

## 2023-07-14 DIAGNOSIS — R1314 Dysphagia, pharyngoesophageal phase: Secondary | ICD-10-CM | POA: Diagnosis not present

## 2023-07-14 DIAGNOSIS — N319 Neuromuscular dysfunction of bladder, unspecified: Secondary | ICD-10-CM | POA: Diagnosis not present

## 2023-07-14 DIAGNOSIS — S72001D Fracture of unspecified part of neck of right femur, subsequent encounter for closed fracture with routine healing: Secondary | ICD-10-CM | POA: Diagnosis not present

## 2023-07-15 DIAGNOSIS — R338 Other retention of urine: Secondary | ICD-10-CM | POA: Diagnosis not present

## 2023-07-15 DIAGNOSIS — R1314 Dysphagia, pharyngoesophageal phase: Secondary | ICD-10-CM | POA: Diagnosis not present

## 2023-07-15 DIAGNOSIS — N312 Flaccid neuropathic bladder, not elsewhere classified: Secondary | ICD-10-CM | POA: Diagnosis not present

## 2023-07-15 DIAGNOSIS — N319 Neuromuscular dysfunction of bladder, unspecified: Secondary | ICD-10-CM | POA: Diagnosis not present

## 2023-07-15 DIAGNOSIS — S72001D Fracture of unspecified part of neck of right femur, subsequent encounter for closed fracture with routine healing: Secondary | ICD-10-CM | POA: Diagnosis not present

## 2023-07-16 ENCOUNTER — Encounter (HOSPITAL_COMMUNITY): Payer: Self-pay | Admitting: Urology

## 2023-07-16 ENCOUNTER — Other Ambulatory Visit (HOSPITAL_COMMUNITY): Payer: Self-pay | Admitting: Urology

## 2023-07-16 DIAGNOSIS — R0981 Nasal congestion: Secondary | ICD-10-CM | POA: Diagnosis not present

## 2023-07-16 DIAGNOSIS — N3281 Overactive bladder: Secondary | ICD-10-CM | POA: Diagnosis not present

## 2023-07-16 DIAGNOSIS — N401 Enlarged prostate with lower urinary tract symptoms: Secondary | ICD-10-CM

## 2023-07-16 DIAGNOSIS — N3289 Other specified disorders of bladder: Secondary | ICD-10-CM | POA: Diagnosis not present

## 2023-07-16 DIAGNOSIS — N390 Urinary tract infection, site not specified: Secondary | ICD-10-CM | POA: Diagnosis not present

## 2023-07-17 DIAGNOSIS — N319 Neuromuscular dysfunction of bladder, unspecified: Secondary | ICD-10-CM | POA: Diagnosis not present

## 2023-07-17 DIAGNOSIS — R1314 Dysphagia, pharyngoesophageal phase: Secondary | ICD-10-CM | POA: Diagnosis not present

## 2023-07-17 DIAGNOSIS — S72001D Fracture of unspecified part of neck of right femur, subsequent encounter for closed fracture with routine healing: Secondary | ICD-10-CM | POA: Diagnosis not present

## 2023-07-18 DIAGNOSIS — N319 Neuromuscular dysfunction of bladder, unspecified: Secondary | ICD-10-CM | POA: Diagnosis not present

## 2023-07-18 DIAGNOSIS — R1314 Dysphagia, pharyngoesophageal phase: Secondary | ICD-10-CM | POA: Diagnosis not present

## 2023-07-18 DIAGNOSIS — S72001D Fracture of unspecified part of neck of right femur, subsequent encounter for closed fracture with routine healing: Secondary | ICD-10-CM | POA: Diagnosis not present

## 2023-07-21 DIAGNOSIS — N319 Neuromuscular dysfunction of bladder, unspecified: Secondary | ICD-10-CM | POA: Diagnosis not present

## 2023-07-21 DIAGNOSIS — R1314 Dysphagia, pharyngoesophageal phase: Secondary | ICD-10-CM | POA: Diagnosis not present

## 2023-07-21 DIAGNOSIS — S72001D Fracture of unspecified part of neck of right femur, subsequent encounter for closed fracture with routine healing: Secondary | ICD-10-CM | POA: Diagnosis not present

## 2023-07-22 DIAGNOSIS — R1314 Dysphagia, pharyngoesophageal phase: Secondary | ICD-10-CM | POA: Diagnosis not present

## 2023-07-22 DIAGNOSIS — S72001D Fracture of unspecified part of neck of right femur, subsequent encounter for closed fracture with routine healing: Secondary | ICD-10-CM | POA: Diagnosis not present

## 2023-07-22 DIAGNOSIS — N319 Neuromuscular dysfunction of bladder, unspecified: Secondary | ICD-10-CM | POA: Diagnosis not present

## 2023-07-23 DIAGNOSIS — S72001D Fracture of unspecified part of neck of right femur, subsequent encounter for closed fracture with routine healing: Secondary | ICD-10-CM | POA: Diagnosis not present

## 2023-07-23 DIAGNOSIS — N319 Neuromuscular dysfunction of bladder, unspecified: Secondary | ICD-10-CM | POA: Diagnosis not present

## 2023-07-23 DIAGNOSIS — R1314 Dysphagia, pharyngoesophageal phase: Secondary | ICD-10-CM | POA: Diagnosis not present

## 2023-07-24 DIAGNOSIS — N319 Neuromuscular dysfunction of bladder, unspecified: Secondary | ICD-10-CM | POA: Diagnosis not present

## 2023-07-24 DIAGNOSIS — R1314 Dysphagia, pharyngoesophageal phase: Secondary | ICD-10-CM | POA: Diagnosis not present

## 2023-07-24 DIAGNOSIS — S72001D Fracture of unspecified part of neck of right femur, subsequent encounter for closed fracture with routine healing: Secondary | ICD-10-CM | POA: Diagnosis not present

## 2023-07-25 DIAGNOSIS — N319 Neuromuscular dysfunction of bladder, unspecified: Secondary | ICD-10-CM | POA: Diagnosis not present

## 2023-07-25 DIAGNOSIS — R1314 Dysphagia, pharyngoesophageal phase: Secondary | ICD-10-CM | POA: Diagnosis not present

## 2023-07-25 DIAGNOSIS — S72001D Fracture of unspecified part of neck of right femur, subsequent encounter for closed fracture with routine healing: Secondary | ICD-10-CM | POA: Diagnosis not present

## 2023-07-26 ENCOUNTER — Emergency Department (HOSPITAL_COMMUNITY): Admitting: Anesthesiology

## 2023-07-26 ENCOUNTER — Emergency Department (HOSPITAL_COMMUNITY)
Admission: EM | Admit: 2023-07-26 | Discharge: 2023-07-26 | Disposition: A | Discharge status: Home / Self Care | Attending: Emergency Medicine | Admitting: Emergency Medicine

## 2023-07-26 ENCOUNTER — Encounter (HOSPITAL_COMMUNITY)
Admission: EM | Disposition: A | Discharge status: Home / Self Care | Payer: Self-pay | Source: Home / Self Care | Attending: Emergency Medicine

## 2023-07-26 DIAGNOSIS — T18128A Food in esophagus causing other injury, initial encounter: Secondary | ICD-10-CM | POA: Insufficient documentation

## 2023-07-26 DIAGNOSIS — I10 Essential (primary) hypertension: Secondary | ICD-10-CM | POA: Insufficient documentation

## 2023-07-26 DIAGNOSIS — Z7951 Long term (current) use of inhaled steroids: Secondary | ICD-10-CM | POA: Diagnosis not present

## 2023-07-26 DIAGNOSIS — K56699 Other intestinal obstruction unspecified as to partial versus complete obstruction: Secondary | ICD-10-CM | POA: Insufficient documentation

## 2023-07-26 DIAGNOSIS — E039 Hypothyroidism, unspecified: Secondary | ICD-10-CM

## 2023-07-26 DIAGNOSIS — W44F3XA Food entering into or through a natural orifice, initial encounter: Secondary | ICD-10-CM | POA: Insufficient documentation

## 2023-07-26 DIAGNOSIS — Z79899 Other long term (current) drug therapy: Secondary | ICD-10-CM | POA: Insufficient documentation

## 2023-07-26 DIAGNOSIS — J45909 Unspecified asthma, uncomplicated: Secondary | ICD-10-CM | POA: Diagnosis not present

## 2023-07-26 DIAGNOSIS — R1310 Dysphagia, unspecified: Secondary | ICD-10-CM | POA: Insufficient documentation

## 2023-07-26 DIAGNOSIS — K222 Esophageal obstruction: Secondary | ICD-10-CM | POA: Insufficient documentation

## 2023-07-26 DIAGNOSIS — K219 Gastro-esophageal reflux disease without esophagitis: Secondary | ICD-10-CM | POA: Insufficient documentation

## 2023-07-26 DIAGNOSIS — K449 Diaphragmatic hernia without obstruction or gangrene: Secondary | ICD-10-CM | POA: Diagnosis not present

## 2023-07-26 DIAGNOSIS — K317 Polyp of stomach and duodenum: Secondary | ICD-10-CM | POA: Insufficient documentation

## 2023-07-26 DIAGNOSIS — F0280 Dementia in other diseases classified elsewhere without behavioral disturbance: Secondary | ICD-10-CM | POA: Insufficient documentation

## 2023-07-26 HISTORY — PX: ESOPHAGOGASTRODUODENOSCOPY: SHX5428

## 2023-07-26 LAB — CBC WITH DIFFERENTIAL/PLATELET
Abs Immature Granulocytes: 0 10*3/uL (ref 0.00–0.07)
Basophils Absolute: 0 10*3/uL (ref 0.0–0.1)
Basophils Relative: 0 %
Eosinophils Absolute: 0.2 10*3/uL (ref 0.0–0.5)
Eosinophils Relative: 3 %
HCT: 38.3 % (ref 36.0–46.0)
Hemoglobin: 12.7 g/dL (ref 12.0–15.0)
Immature Granulocytes: 0 %
Lymphocytes Relative: 26 %
Lymphs Abs: 1.4 10*3/uL (ref 0.7–4.0)
MCH: 31.6 pg (ref 26.0–34.0)
MCHC: 33.2 g/dL (ref 30.0–36.0)
MCV: 95.3 fL (ref 80.0–100.0)
Monocytes Absolute: 0.5 10*3/uL (ref 0.1–1.0)
Monocytes Relative: 10 %
Neutro Abs: 3.2 10*3/uL (ref 1.7–7.7)
Neutrophils Relative %: 61 %
Platelets: 198 10*3/uL (ref 150–400)
RBC: 4.02 MIL/uL (ref 3.87–5.11)
RDW: 13 % (ref 11.5–15.5)
WBC: 5.2 10*3/uL (ref 4.0–10.5)
nRBC: 0 % (ref 0.0–0.2)

## 2023-07-26 LAB — BASIC METABOLIC PANEL WITH GFR
Anion gap: 8 (ref 5–15)
BUN: 10 mg/dL (ref 8–23)
CO2: 24 mmol/L (ref 22–32)
Calcium: 9.3 mg/dL (ref 8.9–10.3)
Chloride: 103 mmol/L (ref 98–111)
Creatinine, Ser: 0.73 mg/dL (ref 0.44–1.00)
GFR, Estimated: >60 mL/min (ref >60)
Glucose, Bld: 90 mg/dL (ref 70–99)
Potassium: 3.7 mmol/L (ref 3.5–5.1)
Sodium: 135 mmol/L (ref 135–145)

## 2023-07-26 SURGERY — EGD (ESOPHAGOGASTRODUODENOSCOPY)
Anesthesia: Monitor Anesthesia Care | Laterality: Left

## 2023-07-26 MED ORDER — PROPOFOL 10 MG/ML IV BOLUS
INTRAVENOUS | Status: DC | PRN
  Administered 2023-07-26: 60 mg via INTRAVENOUS
  Administered 2023-07-26: 20 mg via INTRAVENOUS

## 2023-07-26 MED ORDER — SODIUM CHLORIDE 0.9 % IV SOLN
INTRAVENOUS | Status: AC | PRN
  Administered 2023-07-26: 1000 mL via INTRAMUSCULAR

## 2023-07-26 MED ORDER — LIDOCAINE 2% (20 MG/ML) 5 ML SYRINGE
INTRAMUSCULAR | Status: DC | PRN
  Administered 2023-07-26: 80 mg via INTRAVENOUS

## 2023-07-26 MED ORDER — SUCRALFATE 1 G PO TABS: 1.0000 g | ORAL_TABLET | Freq: Three times a day (TID) | ORAL | 0 refills | Status: DC

## 2023-07-26 MED ORDER — SODIUM CHLORIDE 0.9 % IV SOLN: INTRAVENOUS | Status: DC

## 2023-07-26 MED ORDER — FAMOTIDINE 40 MG PO TABS: 40.0000 mg | ORAL_TABLET | Freq: Every day | ORAL | 1 refills | Status: DC

## 2023-07-26 MED ORDER — FAMOTIDINE 40 MG PO TABS: 40.0000 mg | ORAL_TABLET | Freq: Every day | ORAL | 1 refills | Status: AC

## 2023-07-26 MED ORDER — PROPOFOL 10 MG/ML IV BOLUS
INTRAVENOUS | Status: AC
  Filled 2023-07-26: qty 20

## 2023-07-26 MED ORDER — SODIUM CHLORIDE 0.9 % IV BOLUS: 500.0000 mL | Freq: Once | INTRAVENOUS | Status: DC

## 2023-07-26 NOTE — ED Triage Notes (Signed)
 Patient complains of difficulty swallowing. States she has a history of esophageal stricture and has had her esophagus stretched in past but has been a while. States she had a hard time swallowing her breakfast yesterday and vomited her pills up this morning. Denies any pain. Patient is alert and oriented x 4, no other complaints. JRPRN

## 2023-07-26 NOTE — Op Note (Signed)
 [facility] Patient Name: Megan Foster Procedure Date: 07/26/2023 MRN: [id number] Attending MD: Tami Falcon , MD, [id number] Date of Birth: [birth year] CSN: [id number] Age: 89 Admit Type: Emergency Department Procedure:                EGD with removal of food bolus from distal                            esophagus. Indications:              Dysphagia, Gastro-esophageal reflux disease,                            History of esophageal stricture last dilated in                            2021. Providers:                Tami Falcon, MD, Lonzell Robin, RN, Gabino Joe, Technician, Maxene Span, CRNA,                            Hobart Lulas MD Referring MD:              Medicines:                Monitored Anesthesia Care Complications:            No immediate complications. Estimated Blood Loss:     Estimated blood loss: none. Procedure:                Pre-Anesthesia Assessment: - Prior to the                            procedure, a history and physical was performed,                            and patient medications and allergies were                            reviewed. The patient's tolerance of previous                            anesthesia was also reviewed. The risks and                            benefits of the procedure and the sedation options                            and risks were discussed with the patient. All                            questions were answered, and informed consent was                            obtained. Prior Anticoagulants: The  patient has                            taken no anticoagulant or antiplatelet agents. ASA                            Grade Assessment: III - A patient with severe                            systemic disease. After reviewing the risks and                            benefits, the patient was deemed in satisfactory                            condition to undergo the procedure. After  obtaining                            informed consent, the endoscope was passed under                            direct vision. Throughout the procedure, the                            patient's blood pressure, pulse, and oxygen                             saturations were monitored continuously. The                            [id number]-[id number] ([id number]) Olympus endoscope was introduced                            through the mouth, and advanced to the second part                            of duodenum. The EGD was performed with moderate                            difficulty due to presence of debris in the distal                            esophagus. Successful completion of the procedure                            was aided by lavage. The patient tolerated the                            procedure well. Scope In: Scope Out: Findings:      Diffuse severe mucosal changes characterized by inflammation and       sloughing were found in the entire esophagus.      ?Food debris was found in the lower third of the esophagus. Lavage of       the area  was performed using a moderate amount of sterile water ,       resulting in clearance with adequate visualization.      Multiple sessile polyps with no bleeding and no stigmata of recent       bleeding were found in the gastric fundus and in the gastric body.      A 2 cm small hiatal hernia was noted on exam.      The examined duodenum was normal. Impression:               - Inflamed mucosa in the entire esophagus.                           - Food lodged in the lower third of the                            esophagus-lavage and gently moved to the stomach.                           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                           - Small hiatal hernia.                           - Normal examined duodenum.                           - No specimens collected. Moderate Sedation:      MAC  used. Recommendation:           - Full liquid for now.                           - Continue present medications; take Protonix  40 mg                            1 PO AM 20 minutes before breakfast.                           - Use Sucralfate  suspension 1 gram PO TID daily in                            between meals and at bedtime for 1 month.                           - Famotidine 40 mg at bedtime.                           - Avoid the use of all NSAIDS.                           - Return to GI clinic in 2 weeks. Procedure Code(s):        --- Professional ---  45409, Esophagogastroduodenoscopy, flexible,                            transoral; diagnostic, including collection of                            specimen(s) by brushing or washing, when performed                            (separate procedure) Diagnosis Code(s):        --- Professional ---                           R13.10, Dysphagia, unspecified                           K21.9, Gastro-esophageal reflux disease without                            esophagitis                           T18.128A, Food in esophagus causing other injury,                            initial encounter                           K44.9, Diaphragmatic hernia without obstruction or                            gangrene                           K31.7, Polyp of stomach and duodenum CPT copyright 2022 American Medical Association. All rights reserved. The codes documented in this report are preliminary and upon coder review may  be revised to meet current compliance requirements. Tami Falcon, MD Tami Falcon, MD 07/26/2023 1:53:55 PM This report has been signed electronically. Number of Addenda: 0

## 2023-07-26 NOTE — Anesthesia Preprocedure Evaluation (Addendum)
 Anesthesia Evaluation  Patient identified by MRN, date of birth, ID band Patient awake    Reviewed: Allergy & Precautions, NPO status , Patient's Chart, lab work & pertinent test results  History of Anesthesia Complications Negative for: history of anesthetic complications  Airway Mallampati: II  TM Distance: >3 FB Neck ROM: Full    Dental  (+) Edentulous Upper, Edentulous Lower   Pulmonary asthma    Pulmonary exam normal        Cardiovascular hypertension, Pt. on home beta blockers and Pt. on medications + Peripheral Vascular Disease  Normal cardiovascular exam     Neuro/Psych  Headaches PSYCHIATRIC DISORDERS  Depression   Dementia    GI/Hepatic Neg liver ROS, hiatal hernia,GERD  Medicated and Controlled,, Esophageal stricture    Endo/Other  Hypothyroidism    Renal/GU negative Renal ROS Bladder dysfunction      Musculoskeletal  (+) Arthritis ,    Abdominal   Peds  Hematology negative hematology ROS (+)   Anesthesia Other Findings Ambulates with walker since hip fracture  Reproductive/Obstetrics                             Anesthesia Physical Anesthesia Plan  ASA: 3  Anesthesia Plan: MAC   Post-op Pain Management: Minimal or no pain anticipated   Induction:   PONV Risk Score and Plan: 2 and Propofol  infusion and Treatment may vary due to age or medical condition  Airway Management Planned: Nasal Cannula and Natural Airway  Additional Equipment: None  Intra-op Plan:   Post-operative Plan:   Informed Consent: I have reviewed the patients History and Physical, chart, labs and discussed the procedure including the risks, benefits and alternatives for the proposed anesthesia with the patient or authorized representative who has indicated his/her understanding and acceptance.       Plan Discussed with: CRNA and Anesthesiologist  Anesthesia Plan Comments:         Anesthesia Quick Evaluation

## 2023-07-26 NOTE — Discharge Instructions (Addendum)
 You had some ribbon pasta bunched up in the distal esophagus that gastroenterologist Dr. Tova Fresh was able to clear with endoscopy. There was no stricture there. You will be discharged on: -Sucralfate  suspension 1 gm three times per day for 1 month -Famotidine 40 mg at bedtime Please continue Protonix  40 mg in the morning 20 minutes before breakfast.  Please adhere to a full liquid diet until you can get an appointment with Dr. Karene Oto. Please call his office to make an appointment for within the next 1-2 weeks.  If you experience any severe throat, chest, or abdominal pain, nausea/vomiting so severe you cannot eat/drink or take your medications, or anything else that concerns you, please come back to the ED or dial 911.

## 2023-07-26 NOTE — Transfer of Care (Signed)
 Immediate Anesthesia Transfer of Care Note  Patient: Megan Foster  Procedure(s) Performed: EGD (ESOPHAGOGASTRODUODENOSCOPY) (Left)  Patient Location: PACU  Anesthesia Type:MAC  Level of Consciousness: awake, alert , and oriented  Airway & Oxygen  Therapy: Patient Spontanous Breathing and Patient connected to face mask oxygen   Post-op Assessment: Report given to RN and Post -op Vital signs reviewed and stable  Post vital signs: Reviewed and stable  Last Vitals:  Vitals Value Taken Time  BP 100/41 07/26/23 13:37  Temp 36.4 C 07/26/23 13:35  Pulse 103 07/26/23 13:38  Resp 27 07/26/23 13:38  SpO2 100 % 07/26/23 13:38  Vitals shown include unfiled device data.  Last Pain:  Vitals:   07/26/23 1335  TempSrc: Temporal  PainSc:          Complications: No notable events documented.

## 2023-07-26 NOTE — ED Provider Notes (Signed)
 [place] EMERGENCY DEPARTMENT AT [facility] Provider Note   CSN: [id number] Arrival date & time: 07/26/23  9092     History  No chief complaint on file.   Megan Foster is a 89 y.o. 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 Presents from nursing home [facility] for dysphagia to solids and liquids beginning yesterday. Has h/o esophageal stricture s/p dilation in 2021 as well as known esophageal dysmotility and GERD. She states she'd been doing well until acutely yesterday afternoon when she started regurgitating solids and even liquids. She appears comfortable in the ED but also states that she is having difficulty swallowing saliva, which is very thick now. She feels dehydrated. Wanted to wait until Monday to call the GI clinic, follows w/ Dr. San, but couldn't take liquids so came to ED. Patient is a retired [employer]. Has h/o R hip fx s/p arthroplasty 4/24, still wheelchair bound, also has indwelling foley catheter. Denies cough, abd pain, chest pain, N/V, hematemesis, hemoptysis, f/c, pain with swallowing.     Past Medical History:  Diagnosis Date   Allergy    environmental per pt   Arthritis    Asthma    Dementia (HCC)    Depression    Elevated LFTs    Esophageal dysmotility    GERD (gastroesophageal reflux disease)    Glaucoma    Headache(784.0)    Hiatal hernia    Hyperplastic polyps of stomach    Hypertension    Hypothyroid    Irritable bowel syndrome    Macular degeneration    Nausea & vomiting 04/02/2012   Osteoporosis    Shingles    Sinusitis    Tachycardia    Vitamin D  deficiency        Home Medications Prior to Admission medications   Medication Sig Start Date End Date  Taking? Authorizing Provider  acetaminophen  (TYLENOL ) 500 MG tablet Take 1,000 mg by mouth in the morning and at bedtime.    [provider]  atorvastatin  (LIPITOR) 10 MG tablet Take 10 mg by mouth daily. 05/29/23   [provider]  atorvastatin  (LIPITOR) 20 MG tablet Take 20 mg by mouth at bedtime. Patient not taking: Reported on 06/24/2023    [provider]  dorzolamide -timolol  (COSOPT ) 2-0.5 % ophthalmic solution Place 1 drop into the right eye 2 (two) times daily.    [provider]  fexofenadine (ALLEGRA) 180 MG tablet Take 180 mg by mouth every morning. Patient not taking: Reported on 06/24/2023    [provider]  fluticasone  (FLONASE ) 50 MCG/ACT nasal spray Place 2 sprays into the nose daily. 12/18/10 05/25/22  [place], Peter M, MD  HYDROcodone -acetaminophen  (NORCO/VICODIN) 5-325 MG tablet Take 1 tablet by mouth every 4 (four) hours as needed for moderate pain or severe pain. 05/27/22   Leigh Valery RAMAN, PA-C  ipratropium (ATROVENT ) 0.03 % nasal spray Place 2 sprays into both nostrils daily. Patient not taking: Reported on 06/24/2023 03/01/22   [provider]  latanoprost  (XALATAN ) 0.005 % ophthalmic solution Place 1 drop into both eyes at bedtime.    [provider]  levocetirizine (XYZAL) 5 MG tablet Take 5 mg by mouth every evening. Patient not taking: Reported on 06/24/2023    [provider]  levothyroxine  (SYNTHROID , LEVOTHROID) 88 MCG tablet Take  88 mcg by mouth daily before breakfast. *BRAND NAME ONLY    [provider]  metoprolol  succinate (TOPROL -XL) 50 MG 24 hr tablet TAKE ONE TABLET BY MOUTH ONE TIME DAILY Patient taking differently: Take 50 mg by mouth daily. 09/02/12   [place], Peter M, MD  MICONAZOLE 3 200 MG vaginal suppository Place 200 mg vaginally at bedtime. 06/23/23   [provider]  montelukast  (SINGULAIR ) 10 MG tablet Take 10 mg by mouth at bedtime.    [provider]  Multiple  Vitamins-Minerals (PRESERVISION AREDS 2 PO) Take 1 tablet by mouth 2 (two) times daily.     [provider]  ondansetron  (ZOFRAN ) 4 MG tablet Take 4 mg by mouth every 8 (eight) hours as needed for nausea or vomiting.    [provider]  pantoprazole  (PROTONIX ) 40 MG tablet TAKE 1 TABLET BY MOUTH 2 TIMES DAILY Patient taking differently: Take 40 mg by mouth 2 (two) times daily. 05/10/20   Cirigliano, Vito V, DO  sodium chloride  (MURO 128) 2 % ophthalmic solution Place 1 drop into both eyes in the morning, at noon, in the evening, and at bedtime.    [provider]  sodium chloride  (MURO 128) 5 % ophthalmic ointment Place 1 Application into both eyes at bedtime.    [provider]  tamsulosin (FLOMAX) 0.4 MG CAPS capsule Take 0.8 mg by mouth daily. 06/08/23   [provider]  triamcinolone (NASACORT ALLERGY 24HR) 55 MCG/ACT AERO nasal inhaler Place 2 sprays into the nose daily.    [provider]  triamcinolone cream (KENALOG) 0.5 % Apply 1 Application topically daily as needed (for irritation on hands). 01/18/19   [provider]      Allergies    Patient has no known allergies.    Review of Systems   Review of Systems A 10 point review of systems was performed and is negative unless otherwise reported in HPI.  Physical Exam Updated Vital Signs BP 108/72 (BP Location: Left Arm)   Pulse 81   Temp 97.8 F (36.6 C) (Oral)   Resp 18   Ht 5' 5 (1.651 m)   Wt 54.4 kg   SpO2 99%   BMI 19.97 kg/m  Physical Exam General: Normal appearing elderly female, lying in bed.  HEENT: PERRLA, Sclera anicteric, dry mucous membranes, edentulous, trachea midline. Clear oropharynx. No masses palpable or visibile in neck/throat. Cardiology: RRR, no murmurs/rubs/gallops.   Resp: Normal respiratory rate and effort. CTAB, no wheezes, rhonchi, crackles.  Abd: Soft, non-tender, non-distended. No rebound tenderness or guarding.  GU: Deferred. MSK: No  peripheral edema or signs of trauma. Extremities without deformity or TTP. No cyanosis or clubbing. Skin: warm, dry. Neuro: A&Ox4, CNs II-XII grossly intact. MAEs. Sensation grossly intact.  Psych: Normal mood and affect.   ED Results / Procedures / Treatments   Labs (all labs ordered are listed, but only abnormal results are displayed) Labs Reviewed  CBC WITH DIFFERENTIAL/PLATELET  BASIC METABOLIC PANEL WITH GFR    EKG None  Radiology No results found.  Procedures Procedures    Medications Ordered in ED Medications  0.9 %  sodium chloride  infusion (0 mLs  Stopped 07/26/23 1540)    ED Course/ Medical Decision Making/ A&P                          Medical Decision Making Amount and/or Complexity of Data Reviewed Labs: ordered. Decision-making details documented in ED Course.  Risk  Prescription drug management.    This patient presents to the ED for concern of dysphagia, this involves an extensive number of treatment options, and is a complaint that carries with it a high risk of complications and morbidity.  I considered the following differential and admission for this acute, potentially life threatening condition. Very well-appearing, HDS.   MDM:    No significant electrolyte derangements, or renal injury considering decreased PO intake. She is tolerating her secretions but regurgitating liquids. With acute dysphagia to solids/liquids, will need to page with GI for possible emergent endoscopy. If noting found on endoscopy will need to consider more advanced imaging and possible admission. No throat/neck pain, no masses noted on exam, clear oropharynx.  Per chart review had a high-resolution chest CT on 06/27/2023 that demonstrated probable UIP, air trapping, trace pericardial fluid, and cholelithiasis but no esophageal masses or tracheal masses noted.  Clinical Course as of 08/04/23 1622  Sat Jul 26, 2023  1112 Dr. Kristie w/ Gi who will come to see patient [HN]  1156  CBC with Differential wnl [HN]  1211 Basic metabolic panel Wnl [HN]  1338 D/w Dr. Kristie, patient had ribbon pasta esophageal obstruction/food impaction. Which was removed. Patient will be coming back from endoscopy to ED for discharge back to nursing home. Recs from Dr. Kristie include: -Sucralfate  suspension 1 gm TID for 1 month, continue Protonix  40 mg in the morning 20 minutes before breakfast and take Famotidine  40 mg at bedtime. -Full liquid diet until f/u with her GI  I have prescribed sucralfate  and famotidine . [HN]  1519 Patient reevaluated after endoscopy, she feels well. Ready to be discharged. Tolerating PO without difficulty now. Discussed her prescriptions with she and her sister. They report understanding. DC w/ discharge instructions/return precautions. All questions answered to patient's satisfaction.   [HN]    Clinical Course User Index [HN] Franklyn Sid SAILOR, MD    Labs: I Ordered, and personally interpreted labs.  The pertinent results include:  those listed above  Additional history obtained from chart review, family at bedside.    Reevaluation: After the interventions noted above, I reevaluated the patient and found that they have :resolved  Social Determinants of Health: Lives in SNF  Disposition:  DC w/ discharge instructions/return precautions. All questions answered to patient's satisfaction.    Co morbidities that complicate the patient evaluation  Past Medical History:  Diagnosis Date   Allergy    environmental per pt   Arthritis    Asthma    Dementia (HCC)    Depression    Elevated LFTs    Esophageal dysmotility    GERD (gastroesophageal reflux disease)    Glaucoma    Headache(784.0)    Hiatal hernia    Hyperplastic polyps of stomach    Hypertension    Hypothyroid    Irritable bowel syndrome    Macular degeneration    Nausea & vomiting 04/02/2012   Osteoporosis    Shingles    Sinusitis    Tachycardia    Vitamin D  deficiency       Medicines No orders of the defined types were placed in this encounter.   I have reviewed the patients home medicines and have made adjustments as needed  Problem List / ED Course: Problem List Items Addressed This Visit       Digestive   * (Principal) Food bolus obstruction of intestine (HCC)   Other Visit Diagnoses       Esophageal obstruction due to food impaction    -  Primary                   This note was created using dictation software, which may contain spelling or grammatical errors.    Franklyn Sid SAILOR, MD 08/04/23 [phone]

## 2023-07-26 NOTE — Anesthesia Postprocedure Evaluation (Signed)
 Anesthesia Post Note  Patient: Megan Foster  Procedure(s) Performed: EGD (ESOPHAGOGASTRODUODENOSCOPY) (Left)     Patient location during evaluation: PACU Anesthesia Type: MAC Level of consciousness: awake and alert Pain management: pain level controlled Vital Signs Assessment: post-procedure vital signs reviewed and stable Respiratory status: spontaneous breathing, nonlabored ventilation and respiratory function stable Cardiovascular status: stable and blood pressure returned to baseline Anesthetic complications: no  No notable events documented.  Last Vitals:  Vitals:   07/26/23 1513 07/26/23 1514  BP: (!) 141/97   Pulse: 76   Resp: 16   Temp:  36.4 C  SpO2: 99%     Last Pain:  Vitals:   07/26/23 1514  TempSrc: Oral  PainSc:                  Juventino Oppenheim

## 2023-07-26 NOTE — Consult Note (Signed)
 Cross [facility] Reason for Consult: Difficulty swallowing both solids and liquids since yesterday. Referring Physician: ER MD  Megan Foster is an 89 y.o. female.  HPI: Megan Foster is a 89 year old white female with multiple medical problems listed below is a with a history of esophageal stricture dilated last by Dr. Harry Lindau in 2021.  Patient has been on a mechanical soft diet since she had a food impaction in December 2020. Since then has been doing well till yesterday when she could not swallow any solids or liquids.She took her pills with sips of water  and vomited all back up. She is on Protonix  for reflux. She took some Tums last night hoping it will help her symptoms.  She denies having abdominal pain. She suffers from chronic constipation for which she uses docusate sodium  and MiraLAX  as needed. She is she has a history of chronic mesenteric artery stenosis and had a balloon angioplasty of the SMA by  Dr. Farrel Hones in 2014. She denies having any postprandial abdominal pain. She has had a fall resulting in a right hip fracture last year with complications and therefore is not able to walk. She has a Foley catheter for recurrent UTIs.  Past Medical History:  Diagnosis Date   Allergy    environmental per pt   Arthritis    Asthma    Depression    Elevated LFTs    Esophageal dysmotility    GERD (gastroesophageal reflux disease)    Glaucoma    Headache(784.0)    Hiatal hernia    Hyperplastic polyps of stomach    Hypertension    Hypothyroid    Irritable bowel syndrome    Macular degeneration    Osteoporosis    Shingles    Sinusitis    Tachycardia    Vitamin D  deficiency    Past Surgical History:  Procedure Laterality Date   AORTOGRAM  04/16/12   COLONOSCOPY  2008   normal   ESOPHAGOGASTRODUODENOSCOPY  2014   multiple    ESOPHAGOGASTRODUODENOSCOPY Left 01/30/2019   Procedure: ESOPHAGOGASTRODUODENOSCOPY (EGD);  Surgeon: Annis Kinder, DO;  Location: [facility]  ENDOSCOPY;  Service: Gastroenterology;  Laterality: Left;   FOREIGN BODY REMOVAL  01/30/2019   Procedure: FOREIGN BODY REMOVAL;  Surgeon: Annis Kinder, DO;  Location: [facility] ENDOSCOPY;  Service: Gastroenterology;;   HIP ARTHROPLASTY Right 05/26/2022   Procedure: ARTHROPLASTY BIPOLAR HIP (HEMIARTHROPLASTY);  Surgeon: Adonica Hoose, MD;  Location: [facility] ORS;  Service: Orthopedics;  Laterality: Right;   MULTIPLE TOOTH EXTRACTIONS     TOTAL ABDOMINAL HYSTERECTOMY  1999   UPPER GASTROINTESTINAL ENDOSCOPY     Family History  Problem Relation Age of Onset   Hypertension Mother    Heart attack Mother    Heart attack Father 15   Hypertension Father    Peripheral vascular disease Father    Heart failure Sister    Cancer Sister    Hypertension Brother    Cancer Brother    Hypertension Brother    Breast cancer Sister    Ovarian cancer Other        mat cousin   Prostate cancer Maternal Uncle    Colon polyps Sister        siblings   Colon cancer Other        Maternal Aunt x3 Maternal Uncle x 2   Esophageal cancer Brother        died at 4   Kidney disease Brother    Stomach cancer Neg Hx  Rectal cancer Neg Hx    Social History:  reports that she has never smoked. She has never used smokeless tobacco. She reports that she does not drink alcohol  and does not use drugs.  Allergies: No Known Allergies  Medications: I have reviewed the patient's current medications. Prior to Admission: (Not in a hospital admission)  Scheduled: Continuous: PRN:  Results for orders placed or performed during the hospital encounter of 07/26/23 (from the past 48 hours)  CBC with Differential     Status: None   Collection Time: 07/26/23 11:35 AM  Result Value Ref Range   WBC 5.2 4.0 - 10.5 K/uL   RBC 4.02 3.87 - 5.11 MIL/uL   Hemoglobin 12.7 12.0 - 15.0 g/dL   HCT 54.0 98.1 - 19.1 %   MCV 95.3 80.0 - 100.0 fL   MCH 31.6 26.0 - 34.0 pg   MCHC 33.2 30.0 - 36.0 g/dL   RDW 47.8 29.5 - 62.1 %    Platelets 198 150 - 400 K/uL   nRBC 0.0 0.0 - 0.2 %   Neutrophils Relative % 61 %   Neutro Abs 3.2 1.7 - 7.7 K/uL   Lymphocytes Relative 26 %   Lymphs Abs 1.4 0.7 - 4.0 K/uL   Monocytes Relative 10 %   Monocytes Absolute 0.5 0.1 - 1.0 K/uL   Eosinophils Relative 3 %   Eosinophils Absolute 0.2 0.0 - 0.5 K/uL   Basophils Relative 0 %   Basophils Absolute 0.0 0.0 - 0.1 K/uL   Immature Granulocytes 0 %   Abs Immature Granulocytes 0.00 0.00 - 0.07 K/uL    Comment: Performed at [facility], [address], [place], [place] [address]   Review of Systems  Constitutional:  Positive for activity change. Negative for appetite change, chills, diaphoresis, fatigue and fever.  HENT: Negative.    Eyes: Negative.   Respiratory: Negative.    Cardiovascular: Negative.   Gastrointestinal:  Positive for abdominal distention and constipation. Negative for abdominal pain, anal bleeding, blood in stool and diarrhea.  Endocrine: Negative.   Genitourinary: Negative.   Musculoskeletal:  Positive for arthralgias.  Allergic/Immunologic: Negative.   Neurological: Negative.   Hematological: Negative.   Psychiatric/Behavioral: Negative.     Blood pressure 108/72, pulse 81, temperature 97.8 F (36.6 C), temperature source Oral, resp. rate 18, height 5' 5 (1.651 m), weight 54.4 kg, SpO2 99%. Physical Exam Constitutional:      General: She is not in acute distress.    Appearance: She is not toxic-appearing.  HENT:     Head: Normocephalic and atraumatic.     Comments: Patient is edentulous    Mouth/Throat:     Mouth: Mucous membranes are moist.   Eyes:     Extraocular Movements: Extraocular movements intact.     Pupils: Pupils are equal, round, and reactive to light.    Cardiovascular:     Rate and Rhythm: Normal rate and regular rhythm.  Pulmonary:     Effort: Pulmonary effort is normal.     Breath sounds: Normal breath sounds.  Abdominal:     General: Bowel sounds are  normal.     Palpations: Abdomen is soft.     Tenderness: There is no abdominal tenderness.   Musculoskeletal:     Cervical back: Neck supple.   Skin:    General: Skin is warm and dry.   Neurological:     Mental Status: She is alert and oriented to person, place, and time.   Psychiatric:  Attention and Perception: Attention and perception normal.        Speech: Speech normal.        Behavior: Behavior normal.        Thought Content: Thought content normal.        Cognition and Memory: Cognition and memory normal.        Judgment: Judgment normal.   Assessment/Plan: 1) Acute dysphagia with a history of reflux, hiatal hernia and esophageal stenosis dilated last in 2021-EGD with dilations planned today. 2) Chronic mesenteric ischemia status post balloon and angioplasty of the SMA in 2014. 3) Chronic constipation on MiraLAX  and Colace. 4) Chronic UTI's. 5) Hypertension. 6) Hypothyroidism. 7) Depression. 8) Asthma. Tami Falcon 07/26/2023, 11:57 AM

## 2023-07-28 ENCOUNTER — Ambulatory Visit: Payer: Self-pay | Admitting: Pulmonary Disease

## 2023-07-28 ENCOUNTER — Encounter (HOSPITAL_COMMUNITY): Payer: Self-pay | Admitting: Gastroenterology

## 2023-07-28 DIAGNOSIS — R0981 Nasal congestion: Secondary | ICD-10-CM | POA: Diagnosis not present

## 2023-07-28 DIAGNOSIS — I10 Essential (primary) hypertension: Secondary | ICD-10-CM | POA: Diagnosis not present

## 2023-07-28 DIAGNOSIS — J849 Interstitial pulmonary disease, unspecified: Secondary | ICD-10-CM

## 2023-07-28 DIAGNOSIS — N3281 Overactive bladder: Secondary | ICD-10-CM | POA: Diagnosis not present

## 2023-07-28 DIAGNOSIS — K219 Gastro-esophageal reflux disease without esophagitis: Secondary | ICD-10-CM | POA: Diagnosis not present

## 2023-07-28 DIAGNOSIS — S72001D Fracture of unspecified part of neck of right femur, subsequent encounter for closed fracture with routine healing: Secondary | ICD-10-CM | POA: Diagnosis not present

## 2023-07-28 DIAGNOSIS — N319 Neuromuscular dysfunction of bladder, unspecified: Secondary | ICD-10-CM | POA: Diagnosis not present

## 2023-07-28 DIAGNOSIS — R1314 Dysphagia, pharyngoesophageal phase: Secondary | ICD-10-CM | POA: Diagnosis not present

## 2023-07-29 DIAGNOSIS — R1310 Dysphagia, unspecified: Secondary | ICD-10-CM | POA: Diagnosis not present

## 2023-07-29 DIAGNOSIS — K219 Gastro-esophageal reflux disease without esophagitis: Secondary | ICD-10-CM | POA: Diagnosis not present

## 2023-07-29 DIAGNOSIS — S72001D Fracture of unspecified part of neck of right femur, subsequent encounter for closed fracture with routine healing: Secondary | ICD-10-CM | POA: Diagnosis not present

## 2023-07-29 DIAGNOSIS — N319 Neuromuscular dysfunction of bladder, unspecified: Secondary | ICD-10-CM | POA: Diagnosis not present

## 2023-07-29 DIAGNOSIS — R1314 Dysphagia, pharyngoesophageal phase: Secondary | ICD-10-CM | POA: Diagnosis not present

## 2023-07-29 NOTE — Telephone Encounter (Signed)
 Marlyn is present at the front desk. She would like her sister's test results to be sent to [facility] at [phone] (phone number).

## 2023-07-30 DIAGNOSIS — R1314 Dysphagia, pharyngoesophageal phase: Secondary | ICD-10-CM | POA: Diagnosis not present

## 2023-07-30 DIAGNOSIS — S72001D Fracture of unspecified part of neck of right femur, subsequent encounter for closed fracture with routine healing: Secondary | ICD-10-CM | POA: Diagnosis not present

## 2023-07-30 DIAGNOSIS — N319 Neuromuscular dysfunction of bladder, unspecified: Secondary | ICD-10-CM | POA: Diagnosis not present

## 2023-07-31 ENCOUNTER — Telehealth: Payer: Self-pay

## 2023-07-31 DIAGNOSIS — N319 Neuromuscular dysfunction of bladder, unspecified: Secondary | ICD-10-CM | POA: Diagnosis not present

## 2023-07-31 DIAGNOSIS — S72001D Fracture of unspecified part of neck of right femur, subsequent encounter for closed fracture with routine healing: Secondary | ICD-10-CM | POA: Diagnosis not present

## 2023-07-31 DIAGNOSIS — R1314 Dysphagia, pharyngoesophageal phase: Secondary | ICD-10-CM | POA: Diagnosis not present

## 2023-07-31 NOTE — Telephone Encounter (Signed)
 Copied from CRM [id number]. Topic: Medical Record Request - Other >> Jul 30, 2023 10:28 AM [name] wrote: Reason for CRM: Patient's sister Trenia Fritter [phone] is asking to sent CT report to [facility] [phone] or needs a form for permission to be filled out for the release. Trenia Fritter just wants to make sure patient's report is sent to [facility].  Trenia Fritter states was in the office yesterday and tried to get patient information, but could not get it because she's not on patient DPR yet. Trenia Fritter states patient is in the [facility] for over a year and has put [place] on her contact. Trenia Fritter is going to get release of medical records with patient signature. Informed [facility] can fax to our office [phone] and [facility] medical records [phone].  Please refer to 07/28/2023 result note.

## 2023-07-31 NOTE — Telephone Encounter (Signed)
 Spoke to patient's sister, Marilyn(DPR). Marlyn is not listed on DPR.  Trenia Fritter picked up [facility] form yesterday. She will have pt complete form and bring it back by our office so we can discuss results with her.

## 2023-08-01 DIAGNOSIS — S72001D Fracture of unspecified part of neck of right femur, subsequent encounter for closed fracture with routine healing: Secondary | ICD-10-CM | POA: Diagnosis not present

## 2023-08-01 DIAGNOSIS — R1314 Dysphagia, pharyngoesophageal phase: Secondary | ICD-10-CM | POA: Diagnosis not present

## 2023-08-01 DIAGNOSIS — N319 Neuromuscular dysfunction of bladder, unspecified: Secondary | ICD-10-CM | POA: Diagnosis not present

## 2023-08-04 DIAGNOSIS — S72001D Fracture of unspecified part of neck of right femur, subsequent encounter for closed fracture with routine healing: Secondary | ICD-10-CM | POA: Diagnosis not present

## 2023-08-04 DIAGNOSIS — R1314 Dysphagia, pharyngoesophageal phase: Secondary | ICD-10-CM | POA: Diagnosis not present

## 2023-08-04 DIAGNOSIS — N319 Neuromuscular dysfunction of bladder, unspecified: Secondary | ICD-10-CM | POA: Diagnosis not present

## 2023-08-05 DIAGNOSIS — R1314 Dysphagia, pharyngoesophageal phase: Secondary | ICD-10-CM | POA: Diagnosis not present

## 2023-08-05 DIAGNOSIS — N319 Neuromuscular dysfunction of bladder, unspecified: Secondary | ICD-10-CM | POA: Diagnosis not present

## 2023-08-05 DIAGNOSIS — S72001D Fracture of unspecified part of neck of right femur, subsequent encounter for closed fracture with routine healing: Secondary | ICD-10-CM | POA: Diagnosis not present

## 2023-08-06 DIAGNOSIS — S72001D Fracture of unspecified part of neck of right femur, subsequent encounter for closed fracture with routine healing: Secondary | ICD-10-CM | POA: Diagnosis not present

## 2023-08-06 DIAGNOSIS — N319 Neuromuscular dysfunction of bladder, unspecified: Secondary | ICD-10-CM | POA: Diagnosis not present

## 2023-08-06 DIAGNOSIS — R1314 Dysphagia, pharyngoesophageal phase: Secondary | ICD-10-CM | POA: Diagnosis not present

## 2023-08-07 DIAGNOSIS — N319 Neuromuscular dysfunction of bladder, unspecified: Secondary | ICD-10-CM | POA: Diagnosis not present

## 2023-08-07 DIAGNOSIS — R1314 Dysphagia, pharyngoesophageal phase: Secondary | ICD-10-CM | POA: Diagnosis not present

## 2023-08-07 DIAGNOSIS — J45909 Unspecified asthma, uncomplicated: Secondary | ICD-10-CM | POA: Diagnosis not present

## 2023-08-07 DIAGNOSIS — R1310 Dysphagia, unspecified: Secondary | ICD-10-CM | POA: Diagnosis not present

## 2023-08-07 DIAGNOSIS — D649 Anemia, unspecified: Secondary | ICD-10-CM | POA: Diagnosis not present

## 2023-08-07 DIAGNOSIS — S72001D Fracture of unspecified part of neck of right femur, subsequent encounter for closed fracture with routine healing: Secondary | ICD-10-CM | POA: Diagnosis not present

## 2023-08-07 DIAGNOSIS — K5900 Constipation, unspecified: Secondary | ICD-10-CM | POA: Diagnosis not present

## 2023-08-07 DIAGNOSIS — K219 Gastro-esophageal reflux disease without esophagitis: Secondary | ICD-10-CM | POA: Diagnosis not present

## 2023-08-07 DIAGNOSIS — I10 Essential (primary) hypertension: Secondary | ICD-10-CM | POA: Diagnosis not present

## 2023-08-08 ENCOUNTER — Other Ambulatory Visit (HOSPITAL_COMMUNITY): Payer: Self-pay | Admitting: Student

## 2023-08-08 DIAGNOSIS — R339 Retention of urine, unspecified: Secondary | ICD-10-CM

## 2023-08-08 DIAGNOSIS — N319 Neuromuscular dysfunction of bladder, unspecified: Secondary | ICD-10-CM | POA: Diagnosis not present

## 2023-08-08 DIAGNOSIS — S72001D Fracture of unspecified part of neck of right femur, subsequent encounter for closed fracture with routine healing: Secondary | ICD-10-CM | POA: Diagnosis not present

## 2023-08-08 DIAGNOSIS — R1314 Dysphagia, pharyngoesophageal phase: Secondary | ICD-10-CM | POA: Diagnosis not present

## 2023-08-11 ENCOUNTER — Ambulatory Visit (HOSPITAL_COMMUNITY)
Admission: RE | Admit: 2023-08-11 | Discharge: 2023-08-11 | Disposition: A | Discharge status: Home / Self Care | Source: Ambulatory Visit | Attending: Urology

## 2023-08-11 ENCOUNTER — Other Ambulatory Visit: Payer: Self-pay

## 2023-08-11 ENCOUNTER — Other Ambulatory Visit: Payer: Self-pay | Admitting: Student

## 2023-08-11 ENCOUNTER — Encounter (HOSPITAL_COMMUNITY): Payer: Self-pay

## 2023-08-11 DIAGNOSIS — I10 Essential (primary) hypertension: Secondary | ICD-10-CM | POA: Insufficient documentation

## 2023-08-11 DIAGNOSIS — Z9889 Other specified postprocedural states: Secondary | ICD-10-CM | POA: Diagnosis not present

## 2023-08-11 DIAGNOSIS — K589 Irritable bowel syndrome without diarrhea: Secondary | ICD-10-CM | POA: Diagnosis not present

## 2023-08-11 DIAGNOSIS — R339 Retention of urine, unspecified: Secondary | ICD-10-CM

## 2023-08-11 DIAGNOSIS — R338 Other retention of urine: Secondary | ICD-10-CM | POA: Diagnosis not present

## 2023-08-11 DIAGNOSIS — N401 Enlarged prostate with lower urinary tract symptoms: Secondary | ICD-10-CM

## 2023-08-11 LAB — CBC
HCT: 43.1 % (ref 36.0–46.0)
Hemoglobin: 14.2 g/dL (ref 12.0–15.0)
MCH: 31.4 pg (ref 26.0–34.0)
MCHC: 32.9 g/dL (ref 30.0–36.0)
MCV: 95.4 fL (ref 80.0–100.0)
Platelets: 223 10*3/uL (ref 150–400)
RBC: 4.52 MIL/uL (ref 3.87–5.11)
RDW: 13.2 % (ref 11.5–15.5)
WBC: 5.3 10*3/uL (ref 4.0–10.5)
nRBC: 0 % (ref 0.0–0.2)

## 2023-08-11 LAB — PROTIME-INR
INR: 1 (ref 0.8–1.2)
Prothrombin Time: 14 s (ref 11.4–15.2)

## 2023-08-11 MED ORDER — FENTANYL CITRATE (PF) 100 MCG/2ML IJ SOLN
INTRAMUSCULAR | Status: AC | PRN
  Administered 2023-08-11: 50 ug via INTRAVENOUS
  Administered 2023-08-11 (×2): 25 ug via INTRAVENOUS

## 2023-08-11 MED ORDER — LIDOCAINE HCL 1 % IJ SOLN
10.0000 mL | Freq: Once | INTRAMUSCULAR | Status: AC
  Administered 2023-08-11: 10 mL via INTRADERMAL

## 2023-08-11 MED ORDER — MIDAZOLAM HCL 2 MG/2ML IJ SOLN
INTRAMUSCULAR | Status: AC
Start: 2023-08-11 — End: 2023-08-11
  Filled 2023-08-11: qty 4

## 2023-08-11 MED ORDER — ACETAMINOPHEN 500 MG PO TABS
ORAL_TABLET | ORAL | Status: AC
  Filled 2023-08-11: qty 1

## 2023-08-11 MED ORDER — MIDAZOLAM HCL 2 MG/2ML IJ SOLN
INTRAMUSCULAR | Status: AC | PRN
  Administered 2023-08-11: 1 mg via INTRAVENOUS
  Administered 2023-08-11: .5 mg via INTRAVENOUS

## 2023-08-11 MED ORDER — FENTANYL CITRATE (PF) 100 MCG/2ML IJ SOLN
INTRAMUSCULAR | Status: AC
  Filled 2023-08-11: qty 4

## 2023-08-11 MED ORDER — SODIUM CHLORIDE 0.9 % IV SOLN
INTRAVENOUS | Status: AC | PRN
  Administered 2023-08-11: 2 g via INTRAVENOUS

## 2023-08-11 MED ORDER — ACETAMINOPHEN 500 MG PO TABS
500.0000 mg | ORAL_TABLET | ORAL | Status: DC | PRN
  Administered 2023-08-11: 500 mg via ORAL

## 2023-08-11 MED ORDER — SODIUM CHLORIDE 0.9 % IV SOLN: 2.0000 g | Freq: Once | INTRAVENOUS | Status: DC

## 2023-08-11 MED ORDER — SODIUM CHLORIDE 0.9 % IV SOLN
INTRAVENOUS | Status: AC
  Filled 2023-08-11: qty 20

## 2023-08-11 NOTE — H&P (Signed)
 Chief Complaint: Patient was seen in consultation today for urinary retention  at the request of MeganEugene D Foster  Referring Physician(s): Carolee Sherwood BIRCH Foster  Supervising Physician: Luverne Aran  Patient Status: [facility] - Out-pt  History of Present Illness: Megan Foster is a 89 y.o. female with PMHs of HTN, IBS, and urinary retention who presents for SP tube placement.   Patient  has been followed by Urology for hx of urinary retention which has been managed with a Foley catheter. SP catheter was recommended to the patient which she decided to proceed.   Patient laying in bed, not in acute distress. Sister at the bedside.  Reports runny nose due to allergy.  Denise headache, fever, chills, shortness of breath, cough, chest pain, abdominal pain, nausea ,vomiting, and bleeding.   Past Medical History:  Diagnosis Date   Allergy    environmental per pt   Arthritis    Asthma    Dementia (HCC)    Depression    Elevated LFTs    Esophageal dysmotility    GERD (gastroesophageal reflux disease)    Glaucoma    Headache(784.0)    Hiatal hernia    Hyperplastic polyps of stomach    Hypertension    Hypothyroid    Irritable bowel syndrome    Macular degeneration    Nausea & vomiting 04/02/2012   Osteoporosis    Shingles    Sinusitis    Tachycardia    Vitamin D  deficiency     Past Surgical History:  Procedure Laterality Date   AORTOGRAM  04/16/12   COLONOSCOPY  2008   normal   ESOPHAGOGASTRODUODENOSCOPY  2014   multiple    ESOPHAGOGASTRODUODENOSCOPY Left 01/30/2019   Procedure: ESOPHAGOGASTRODUODENOSCOPY (EGD);  Surgeon: San Sandor GAILS, DO;  Location: [facility];  Service: Gastroenterology;  Laterality: Left;   ESOPHAGOGASTRODUODENOSCOPY Left 07/26/2023   Procedure: EGD (ESOPHAGOGASTRODUODENOSCOPY);  Surgeon: Kristie Lamprey, MD;  Location: [facility];  Service: Gastroenterology;  Laterality: Left;  Dysphagia-history of esophageal stricture   FOREIGN BODY REMOVAL   01/30/2019   Procedure: FOREIGN BODY REMOVAL;  Surgeon: San Sandor GAILS, DO;  Location: [facility];  Service: Gastroenterology;;   HIP ARTHROPLASTY Right 05/26/2022   Procedure: ARTHROPLASTY BIPOLAR HIP (HEMIARTHROPLASTY);  Surgeon: Fidel Rogue, MD;  Location: [facility];  Service: Orthopedics;  Laterality: Right;   MULTIPLE TOOTH EXTRACTIONS     TOTAL ABDOMINAL HYSTERECTOMY  1999   UPPER GASTROINTESTINAL ENDOSCOPY      Allergies: Patient has no known allergies.  Medications: Prior to Admission medications   Medication Sig Start Date End Date Taking? Authorizing Provider  atorvastatin  (LIPITOR) 10 MG tablet Take 10 mg by mouth daily. 05/29/23  Yes [provider]  famotidine  (PEPCID ) 40 MG tablet Take 1 tablet (40 mg total) by mouth at bedtime. 07/26/23  Yes Franklyn Sid SAILOR, MD  fexofenadine (ALLEGRA) 180 MG tablet Take 180 mg by mouth every morning.   Yes [provider]  fluticasone  (FLONASE ) 50 MCG/ACT nasal spray Place 2 sprays into the nose daily. 12/18/10 08/11/23 Yes [place], Peter M, MD  levothyroxine  (SYNTHROID , LEVOTHROID) 88 MCG tablet Take 88 mcg by mouth daily before breakfast. *BRAND NAME ONLY   Yes [provider]  metoprolol  succinate (TOPROL -XL) 50 MG 24 hr tablet TAKE ONE TABLET BY MOUTH ONE TIME DAILY Patient taking differently: Take 50 mg by mouth daily. 09/02/12  Yes [place], Peter M, MD  montelukast  (SINGULAIR ) 10 MG tablet Take 10 mg by mouth at bedtime.   Yes [provider]  Multiple Vitamins-Minerals (PRESERVISION AREDS 2 PO) Take 1 tablet by mouth 2 (two) times daily.    Yes [provider]  pantoprazole  (PROTONIX ) 40 MG tablet TAKE 1 TABLET BY MOUTH 2 TIMES DAILY Patient taking differently: Take 40 mg by mouth 2 (two) times daily. 05/10/20  Yes Cirigliano, Vito V, DO  sodium chloride  (MURO 128) 5 % ophthalmic ointment Place 1 Application into both eyes at bedtime.   Yes [provider]  tamsulosin (FLOMAX) 0.4 MG  CAPS capsule Take 0.8 mg by mouth daily. 06/08/23  Yes [provider]  acetaminophen  (TYLENOL ) 500 MG tablet Take 1,000 mg by mouth in the morning and at bedtime.    [provider]  atorvastatin  (LIPITOR) 20 MG tablet Take 20 mg by mouth at bedtime. Patient not taking: Reported on 06/24/2023    [provider]  dorzolamide -timolol  (COSOPT ) 2-0.5 % ophthalmic solution Place 1 drop into the right eye 2 (two) times daily.    [provider]  HYDROcodone -acetaminophen  (NORCO/VICODIN) 5-325 MG tablet Take 1 tablet by mouth every 4 (four) hours as needed for moderate pain or severe pain. 05/27/22   Leigh Valery RAMAN, PA-C  ipratropium (ATROVENT ) 0.03 % nasal spray Place 2 sprays into both nostrils daily. Patient not taking: Reported on 06/24/2023 03/01/22   [provider]  latanoprost  (XALATAN ) 0.005 % ophthalmic solution Place 1 drop into both eyes at bedtime.    [provider]  levocetirizine (XYZAL) 5 MG tablet Take 5 mg by mouth every evening. Patient not taking: Reported on 06/24/2023    [provider]  MICONAZOLE 3 200 MG vaginal suppository Place 200 mg vaginally at bedtime. 06/23/23   [provider]  ondansetron  (ZOFRAN ) 4 MG tablet Take 4 mg by mouth every 8 (eight) hours as needed for nausea or vomiting.    [provider]  sodium chloride  (MURO 128) 2 % ophthalmic solution Place 1 drop into both eyes in the morning, at noon, in the evening, and at bedtime.    [provider]  sucralfate  (CARAFATE ) 1 g tablet Take 1 tablet (1 g total) by mouth in the morning, at noon, and at bedtime. 07/26/23   Franklyn Sid SAILOR, MD  triamcinolone (NASACORT ALLERGY 24HR) 55 MCG/ACT AERO nasal inhaler Place 2 sprays into the nose daily.    [provider]  triamcinolone cream (KENALOG) 0.5 % Apply 1 Application topically daily as needed (for irritation on hands). 01/18/19   [provider]     Family History   Problem Relation Age of Onset   Hypertension Mother    Heart attack Mother    Heart attack Father 67   Hypertension Father    Peripheral vascular disease Father    Heart failure Sister    Cancer Sister    Hypertension Brother    Cancer Brother    Hypertension Brother    Breast cancer Sister    Ovarian cancer Other        mat cousin   Prostate cancer Maternal Uncle    Colon polyps Sister        siblings   Colon cancer Other        Maternal Aunt x3 Maternal Uncle x 2   Esophageal cancer Brother        died at 68   Kidney disease Brother    Stomach cancer Neg Hx    Rectal cancer Neg Hx     Social History   Socioeconomic History  Marital status: Widowed    Spouse name: Not on file   Number of children: 1   Years of education: Not on file   Highest education level: Not on file  Occupational History   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, civil (consulting)  Tobacco Use   Smoking status: Never   Smokeless tobacco: Never  Vaping Use   Vaping status: Never Used  Substance and Sexual Activity   Alcohol  use: No    Alcohol /week: 0.0 standard drinks of alcohol     Comment: <1 day   Drug use: No   Sexual activity: Not on file  Other Topics Concern   Not on file  Social History Narrative   Not on file   Social Drivers of Health   Financial Resource Strain: Not on file  Food Insecurity: No Food Insecurity (05/25/2022)   Hunger Vital Sign    Worried About Running Out of Food in the Last Year: Never true    Ran Out of Food in the Last Year: Never true  Transportation Needs: No Transportation Needs (05/25/2022)   PRAPARE - Administrator, Civil Service (Medical): No    Lack of Transportation (Non-Medical): No  Physical Activity: Not on file  Stress: Not on file  Social Connections: Not on file     Review of Systems: A 12 point ROS discussed and pertinent positives are indicated in the HPI above.  All other systems are negative.  Vital  Signs: BP 131/65   Pulse 80   Temp 98.1 F (36.7 C) (Oral)   Resp 19   Ht 5' 5.5 (1.664 m)   Wt 123 lb (55.8 kg)   SpO2 96%   BMI 20.16 kg/m    Physical Exam Vitals reviewed.  Constitutional:      General: She is not in acute distress.    Appearance: She is not ill-appearing.  HENT:     Head: Normocephalic and atraumatic.     Mouth/Throat:     Mouth: Mucous membranes are moist.     Pharynx: Oropharynx is clear.   Cardiovascular:     Rate and Rhythm: Normal rate and regular rhythm.     Heart sounds: Normal heart sounds.  Pulmonary:     Effort: Pulmonary effort is normal.     Breath sounds: Normal breath sounds.  Abdominal:     General: Abdomen is flat.     Palpations: Abdomen is soft.  Genitourinary:    Comments: + Foley  Musculoskeletal:     Cervical back: Neck supple.   Skin:    General: Skin is warm and dry.     Coloration: Skin is not jaundiced or pale.   Neurological:     Mental Status: She is alert and oriented to person, place, and time.   Psychiatric:        Mood and Affect: Mood normal.        Behavior: Behavior normal.        Judgment: Judgment normal.     MD Evaluation Airway: WNL Heart: WNL Abdomen: WNL Chest/ Lungs: WNL ASA  Classification: 3 Mallampati/Airway Score: One  Imaging: No results found.  Labs:  CBC: Recent Labs    07/26/23 1135 08/11/23 0822  WBC 5.2 5.3  HGB 12.7 14.2  HCT 38.3 43.1  PLT 198 223    COAGS: Recent Labs    08/11/23 0822  INR 1.0    BMP: Recent Labs    07/26/23 1135  NA 135  K  3.7  CL 103  CO2 24  GLUCOSE 90  BUN 10  CALCIUM  9.3  CREATININE 0.73  GFRNONAA >60    LIVER FUNCTION TESTS: No results for input(s): BILITOT, AST, ALT, ALKPHOS, PROT, ALBUMIN  in the last 8760 hours.  TUMOR MARKERS: No results for input(s): AFPTM, CEA, CA199, CHROMGRNA in the last 8760 hours.  Assessment and Plan: 89 y.o. female with urinary retention who presents for SP tube  placement.   VSS Labs stable  Not on AC/AP NKDA  Risks and benefits of suprapubic catheter placement were discussed with the patient including bleeding, infection, damage to adjacent structures, bladder perforation/fistula connection, and sepsis.  All of the patient's questions were answered, patient is agreeable to proceed. Consent signed and in chart.   Thank you for this interesting consult.  I greatly enjoyed meeting Callen Zuba and look forward to participating in their care.  A copy of this report was sent to the requesting provider on this date.  Electronically Signed: Toya VEAR Cousin, PA-C 08/11/2023, 8:57 AM   I spent a total of  30 Minutes   in face to face in clinical consultation, greater than 50% of which was counseling/coordinating care for SP catheter placement.   This chart was dictated using voice recognition software.  Despite best efforts to proofread,  errors can occur which can change the documentation meaning.

## 2023-08-11 NOTE — Procedures (Signed)
 Interventional Radiology Procedure:   Indications: Urinary retention  Procedure: Suprapubic catheter placement  Findings: 14 Fr tube placed in bladder.  Clear urine aspirated.  Foley catheter removed.   Complications: None     EBL: Minimal  Plan: Exchange to 16 Fr balloon retention tube in 4-6 weeks.   Megan Foster R. Philip, MD  Pager: [phone]

## 2023-08-11 NOTE — Progress Notes (Signed)
 Report given to Amy (nurse at [facility]). Also called [facility] transportation and let them know patient will be ready for D/c at 1415.

## 2023-08-11 NOTE — Progress Notes (Signed)
 Removed NSL left arm, cath intact site unremarkable.

## 2023-08-12 ENCOUNTER — Telehealth: Payer: Self-pay

## 2023-08-12 DIAGNOSIS — M1990 Unspecified osteoarthritis, unspecified site: Secondary | ICD-10-CM | POA: Diagnosis not present

## 2023-08-12 DIAGNOSIS — S72001D Fracture of unspecified part of neck of right femur, subsequent encounter for closed fracture with routine healing: Secondary | ICD-10-CM | POA: Diagnosis not present

## 2023-08-12 DIAGNOSIS — R262 Difficulty in walking, not elsewhere classified: Secondary | ICD-10-CM | POA: Diagnosis not present

## 2023-08-12 DIAGNOSIS — N319 Neuromuscular dysfunction of bladder, unspecified: Secondary | ICD-10-CM | POA: Diagnosis not present

## 2023-08-12 DIAGNOSIS — R1314 Dysphagia, pharyngoesophageal phase: Secondary | ICD-10-CM | POA: Diagnosis not present

## 2023-08-12 DIAGNOSIS — R41841 Cognitive communication deficit: Secondary | ICD-10-CM | POA: Diagnosis not present

## 2023-08-12 NOTE — Telephone Encounter (Signed)
 DPR has been added. I spoke to [place]. Consuelo was not avail Per notes in last encounter she wanted CT report sent to the nurses station at [facility] [employer]. I verified this, the CT date, printed and sent report per her request.

## 2023-08-12 NOTE — Telephone Encounter (Signed)
 Copied from CRM [id number]. Topic: General - Other >> Aug 11, 2023  4:14 PM Rilla B wrote: Reason for CRM: Sister Neville returning call to [place].  Please call [phone]  ATC sister Neville back regarding prior message.Neville is not on patient's DPR.

## 2023-08-13 DIAGNOSIS — M1990 Unspecified osteoarthritis, unspecified site: Secondary | ICD-10-CM | POA: Diagnosis not present

## 2023-08-13 DIAGNOSIS — R41841 Cognitive communication deficit: Secondary | ICD-10-CM | POA: Diagnosis not present

## 2023-08-13 DIAGNOSIS — N319 Neuromuscular dysfunction of bladder, unspecified: Secondary | ICD-10-CM | POA: Diagnosis not present

## 2023-08-13 DIAGNOSIS — S72001D Fracture of unspecified part of neck of right femur, subsequent encounter for closed fracture with routine healing: Secondary | ICD-10-CM | POA: Diagnosis not present

## 2023-08-13 DIAGNOSIS — R0981 Nasal congestion: Secondary | ICD-10-CM | POA: Diagnosis not present

## 2023-08-13 DIAGNOSIS — R1310 Dysphagia, unspecified: Secondary | ICD-10-CM | POA: Diagnosis not present

## 2023-08-13 DIAGNOSIS — K219 Gastro-esophageal reflux disease without esophagitis: Secondary | ICD-10-CM | POA: Diagnosis not present

## 2023-08-13 DIAGNOSIS — I10 Essential (primary) hypertension: Secondary | ICD-10-CM | POA: Diagnosis not present

## 2023-08-13 DIAGNOSIS — R262 Difficulty in walking, not elsewhere classified: Secondary | ICD-10-CM | POA: Diagnosis not present

## 2023-08-13 DIAGNOSIS — R1314 Dysphagia, pharyngoesophageal phase: Secondary | ICD-10-CM | POA: Diagnosis not present

## 2023-08-14 DIAGNOSIS — M1990 Unspecified osteoarthritis, unspecified site: Secondary | ICD-10-CM | POA: Diagnosis not present

## 2023-08-14 DIAGNOSIS — R262 Difficulty in walking, not elsewhere classified: Secondary | ICD-10-CM | POA: Diagnosis not present

## 2023-08-14 DIAGNOSIS — R1314 Dysphagia, pharyngoesophageal phase: Secondary | ICD-10-CM | POA: Diagnosis not present

## 2023-08-14 DIAGNOSIS — R41841 Cognitive communication deficit: Secondary | ICD-10-CM | POA: Diagnosis not present

## 2023-08-14 DIAGNOSIS — S72001D Fracture of unspecified part of neck of right femur, subsequent encounter for closed fracture with routine healing: Secondary | ICD-10-CM | POA: Diagnosis not present

## 2023-08-14 DIAGNOSIS — N319 Neuromuscular dysfunction of bladder, unspecified: Secondary | ICD-10-CM | POA: Diagnosis not present

## 2023-08-15 DIAGNOSIS — R1314 Dysphagia, pharyngoesophageal phase: Secondary | ICD-10-CM | POA: Diagnosis not present

## 2023-08-15 DIAGNOSIS — E0590 Thyrotoxicosis, unspecified without thyrotoxic crisis or storm: Secondary | ICD-10-CM | POA: Diagnosis not present

## 2023-08-15 DIAGNOSIS — D649 Anemia, unspecified: Secondary | ICD-10-CM | POA: Diagnosis not present

## 2023-08-15 DIAGNOSIS — M1990 Unspecified osteoarthritis, unspecified site: Secondary | ICD-10-CM | POA: Diagnosis not present

## 2023-08-15 DIAGNOSIS — R262 Difficulty in walking, not elsewhere classified: Secondary | ICD-10-CM | POA: Diagnosis not present

## 2023-08-15 DIAGNOSIS — S72001D Fracture of unspecified part of neck of right femur, subsequent encounter for closed fracture with routine healing: Secondary | ICD-10-CM | POA: Diagnosis not present

## 2023-08-15 DIAGNOSIS — R41841 Cognitive communication deficit: Secondary | ICD-10-CM | POA: Diagnosis not present

## 2023-08-15 DIAGNOSIS — N319 Neuromuscular dysfunction of bladder, unspecified: Secondary | ICD-10-CM | POA: Diagnosis not present

## 2023-08-16 DIAGNOSIS — R300 Dysuria: Secondary | ICD-10-CM | POA: Diagnosis not present

## 2023-08-16 DIAGNOSIS — S72001D Fracture of unspecified part of neck of right femur, subsequent encounter for closed fracture with routine healing: Secondary | ICD-10-CM | POA: Diagnosis not present

## 2023-08-16 DIAGNOSIS — N319 Neuromuscular dysfunction of bladder, unspecified: Secondary | ICD-10-CM | POA: Diagnosis not present

## 2023-08-16 DIAGNOSIS — R1314 Dysphagia, pharyngoesophageal phase: Secondary | ICD-10-CM | POA: Diagnosis not present

## 2023-08-16 DIAGNOSIS — R41841 Cognitive communication deficit: Secondary | ICD-10-CM | POA: Diagnosis not present

## 2023-08-16 DIAGNOSIS — R262 Difficulty in walking, not elsewhere classified: Secondary | ICD-10-CM | POA: Diagnosis not present

## 2023-08-16 DIAGNOSIS — M1990 Unspecified osteoarthritis, unspecified site: Secondary | ICD-10-CM | POA: Diagnosis not present

## 2023-08-18 DIAGNOSIS — R1314 Dysphagia, pharyngoesophageal phase: Secondary | ICD-10-CM | POA: Diagnosis not present

## 2023-08-18 DIAGNOSIS — M1990 Unspecified osteoarthritis, unspecified site: Secondary | ICD-10-CM | POA: Diagnosis not present

## 2023-08-18 DIAGNOSIS — R41841 Cognitive communication deficit: Secondary | ICD-10-CM | POA: Diagnosis not present

## 2023-08-18 DIAGNOSIS — N319 Neuromuscular dysfunction of bladder, unspecified: Secondary | ICD-10-CM | POA: Diagnosis not present

## 2023-08-18 DIAGNOSIS — S72001D Fracture of unspecified part of neck of right femur, subsequent encounter for closed fracture with routine healing: Secondary | ICD-10-CM | POA: Diagnosis not present

## 2023-08-18 DIAGNOSIS — R262 Difficulty in walking, not elsewhere classified: Secondary | ICD-10-CM | POA: Diagnosis not present

## 2023-08-19 DIAGNOSIS — R41841 Cognitive communication deficit: Secondary | ICD-10-CM | POA: Diagnosis not present

## 2023-08-19 DIAGNOSIS — S72001D Fracture of unspecified part of neck of right femur, subsequent encounter for closed fracture with routine healing: Secondary | ICD-10-CM | POA: Diagnosis not present

## 2023-08-19 DIAGNOSIS — R262 Difficulty in walking, not elsewhere classified: Secondary | ICD-10-CM | POA: Diagnosis not present

## 2023-08-19 DIAGNOSIS — R1314 Dysphagia, pharyngoesophageal phase: Secondary | ICD-10-CM | POA: Diagnosis not present

## 2023-08-19 DIAGNOSIS — M1990 Unspecified osteoarthritis, unspecified site: Secondary | ICD-10-CM | POA: Diagnosis not present

## 2023-08-19 DIAGNOSIS — N319 Neuromuscular dysfunction of bladder, unspecified: Secondary | ICD-10-CM | POA: Diagnosis not present

## 2023-08-19 NOTE — Telephone Encounter (Signed)
 DPR has been updated. I have spoken to pt's sister, Marilyn(DPR) and relayed below results/recommendations.  Neville would like to have labs drawn during 8/25 appt due to transportation issues.   Routing to Dr. Theophilus to make aware.

## 2023-08-20 DIAGNOSIS — S72001D Fracture of unspecified part of neck of right femur, subsequent encounter for closed fracture with routine healing: Secondary | ICD-10-CM | POA: Diagnosis not present

## 2023-08-20 DIAGNOSIS — R41841 Cognitive communication deficit: Secondary | ICD-10-CM | POA: Diagnosis not present

## 2023-08-20 DIAGNOSIS — I10 Essential (primary) hypertension: Secondary | ICD-10-CM | POA: Diagnosis not present

## 2023-08-20 DIAGNOSIS — K219 Gastro-esophageal reflux disease without esophagitis: Secondary | ICD-10-CM | POA: Diagnosis not present

## 2023-08-20 DIAGNOSIS — M1990 Unspecified osteoarthritis, unspecified site: Secondary | ICD-10-CM | POA: Diagnosis not present

## 2023-08-20 DIAGNOSIS — R1310 Dysphagia, unspecified: Secondary | ICD-10-CM | POA: Diagnosis not present

## 2023-08-20 DIAGNOSIS — N319 Neuromuscular dysfunction of bladder, unspecified: Secondary | ICD-10-CM | POA: Diagnosis not present

## 2023-08-20 DIAGNOSIS — R1314 Dysphagia, pharyngoesophageal phase: Secondary | ICD-10-CM | POA: Diagnosis not present

## 2023-08-20 DIAGNOSIS — R262 Difficulty in walking, not elsewhere classified: Secondary | ICD-10-CM | POA: Diagnosis not present

## 2023-08-21 DIAGNOSIS — R41841 Cognitive communication deficit: Secondary | ICD-10-CM | POA: Diagnosis not present

## 2023-08-21 DIAGNOSIS — N319 Neuromuscular dysfunction of bladder, unspecified: Secondary | ICD-10-CM | POA: Diagnosis not present

## 2023-08-21 DIAGNOSIS — R1314 Dysphagia, pharyngoesophageal phase: Secondary | ICD-10-CM | POA: Diagnosis not present

## 2023-08-21 DIAGNOSIS — R262 Difficulty in walking, not elsewhere classified: Secondary | ICD-10-CM | POA: Diagnosis not present

## 2023-08-21 DIAGNOSIS — M1990 Unspecified osteoarthritis, unspecified site: Secondary | ICD-10-CM | POA: Diagnosis not present

## 2023-08-21 DIAGNOSIS — S72001D Fracture of unspecified part of neck of right femur, subsequent encounter for closed fracture with routine healing: Secondary | ICD-10-CM | POA: Diagnosis not present

## 2023-08-22 DIAGNOSIS — R41841 Cognitive communication deficit: Secondary | ICD-10-CM | POA: Diagnosis not present

## 2023-08-22 DIAGNOSIS — M1990 Unspecified osteoarthritis, unspecified site: Secondary | ICD-10-CM | POA: Diagnosis not present

## 2023-08-22 DIAGNOSIS — N319 Neuromuscular dysfunction of bladder, unspecified: Secondary | ICD-10-CM | POA: Diagnosis not present

## 2023-08-22 DIAGNOSIS — R1314 Dysphagia, pharyngoesophageal phase: Secondary | ICD-10-CM | POA: Diagnosis not present

## 2023-08-22 DIAGNOSIS — S72001D Fracture of unspecified part of neck of right femur, subsequent encounter for closed fracture with routine healing: Secondary | ICD-10-CM | POA: Diagnosis not present

## 2023-08-22 DIAGNOSIS — R262 Difficulty in walking, not elsewhere classified: Secondary | ICD-10-CM | POA: Diagnosis not present

## 2023-08-24 DIAGNOSIS — R262 Difficulty in walking, not elsewhere classified: Secondary | ICD-10-CM | POA: Diagnosis not present

## 2023-08-24 DIAGNOSIS — S72001D Fracture of unspecified part of neck of right femur, subsequent encounter for closed fracture with routine healing: Secondary | ICD-10-CM | POA: Diagnosis not present

## 2023-08-24 DIAGNOSIS — R41841 Cognitive communication deficit: Secondary | ICD-10-CM | POA: Diagnosis not present

## 2023-08-24 DIAGNOSIS — M1990 Unspecified osteoarthritis, unspecified site: Secondary | ICD-10-CM | POA: Diagnosis not present

## 2023-08-24 DIAGNOSIS — R1314 Dysphagia, pharyngoesophageal phase: Secondary | ICD-10-CM | POA: Diagnosis not present

## 2023-08-24 DIAGNOSIS — N319 Neuromuscular dysfunction of bladder, unspecified: Secondary | ICD-10-CM | POA: Diagnosis not present

## 2023-08-25 DIAGNOSIS — R41841 Cognitive communication deficit: Secondary | ICD-10-CM | POA: Diagnosis not present

## 2023-08-25 DIAGNOSIS — R262 Difficulty in walking, not elsewhere classified: Secondary | ICD-10-CM | POA: Diagnosis not present

## 2023-08-25 DIAGNOSIS — M1990 Unspecified osteoarthritis, unspecified site: Secondary | ICD-10-CM | POA: Diagnosis not present

## 2023-08-25 DIAGNOSIS — S72001D Fracture of unspecified part of neck of right femur, subsequent encounter for closed fracture with routine healing: Secondary | ICD-10-CM | POA: Diagnosis not present

## 2023-08-25 DIAGNOSIS — R1314 Dysphagia, pharyngoesophageal phase: Secondary | ICD-10-CM | POA: Diagnosis not present

## 2023-08-25 DIAGNOSIS — N319 Neuromuscular dysfunction of bladder, unspecified: Secondary | ICD-10-CM | POA: Diagnosis not present

## 2023-08-26 DIAGNOSIS — M1990 Unspecified osteoarthritis, unspecified site: Secondary | ICD-10-CM | POA: Diagnosis not present

## 2023-08-26 DIAGNOSIS — R262 Difficulty in walking, not elsewhere classified: Secondary | ICD-10-CM | POA: Diagnosis not present

## 2023-08-26 DIAGNOSIS — S72001D Fracture of unspecified part of neck of right femur, subsequent encounter for closed fracture with routine healing: Secondary | ICD-10-CM | POA: Diagnosis not present

## 2023-08-26 DIAGNOSIS — R1314 Dysphagia, pharyngoesophageal phase: Secondary | ICD-10-CM | POA: Diagnosis not present

## 2023-08-26 DIAGNOSIS — R41841 Cognitive communication deficit: Secondary | ICD-10-CM | POA: Diagnosis not present

## 2023-08-26 DIAGNOSIS — N319 Neuromuscular dysfunction of bladder, unspecified: Secondary | ICD-10-CM | POA: Diagnosis not present

## 2023-08-27 DIAGNOSIS — R1314 Dysphagia, pharyngoesophageal phase: Secondary | ICD-10-CM | POA: Diagnosis not present

## 2023-08-27 DIAGNOSIS — R262 Difficulty in walking, not elsewhere classified: Secondary | ICD-10-CM | POA: Diagnosis not present

## 2023-08-27 DIAGNOSIS — M1990 Unspecified osteoarthritis, unspecified site: Secondary | ICD-10-CM | POA: Diagnosis not present

## 2023-08-27 DIAGNOSIS — S72001D Fracture of unspecified part of neck of right femur, subsequent encounter for closed fracture with routine healing: Secondary | ICD-10-CM | POA: Diagnosis not present

## 2023-08-27 DIAGNOSIS — N319 Neuromuscular dysfunction of bladder, unspecified: Secondary | ICD-10-CM | POA: Diagnosis not present

## 2023-08-27 DIAGNOSIS — R41841 Cognitive communication deficit: Secondary | ICD-10-CM | POA: Diagnosis not present

## 2023-08-28 DIAGNOSIS — S72001D Fracture of unspecified part of neck of right femur, subsequent encounter for closed fracture with routine healing: Secondary | ICD-10-CM | POA: Diagnosis not present

## 2023-08-28 DIAGNOSIS — R262 Difficulty in walking, not elsewhere classified: Secondary | ICD-10-CM | POA: Diagnosis not present

## 2023-08-28 DIAGNOSIS — M1990 Unspecified osteoarthritis, unspecified site: Secondary | ICD-10-CM | POA: Diagnosis not present

## 2023-08-28 DIAGNOSIS — R41841 Cognitive communication deficit: Secondary | ICD-10-CM | POA: Diagnosis not present

## 2023-08-28 DIAGNOSIS — N319 Neuromuscular dysfunction of bladder, unspecified: Secondary | ICD-10-CM | POA: Diagnosis not present

## 2023-08-28 DIAGNOSIS — R1314 Dysphagia, pharyngoesophageal phase: Secondary | ICD-10-CM | POA: Diagnosis not present

## 2023-08-29 DIAGNOSIS — N319 Neuromuscular dysfunction of bladder, unspecified: Secondary | ICD-10-CM | POA: Diagnosis not present

## 2023-08-29 DIAGNOSIS — R41841 Cognitive communication deficit: Secondary | ICD-10-CM | POA: Diagnosis not present

## 2023-08-29 DIAGNOSIS — R262 Difficulty in walking, not elsewhere classified: Secondary | ICD-10-CM | POA: Diagnosis not present

## 2023-08-29 DIAGNOSIS — S72001D Fracture of unspecified part of neck of right femur, subsequent encounter for closed fracture with routine healing: Secondary | ICD-10-CM | POA: Diagnosis not present

## 2023-08-29 DIAGNOSIS — R1314 Dysphagia, pharyngoesophageal phase: Secondary | ICD-10-CM | POA: Diagnosis not present

## 2023-08-29 DIAGNOSIS — M1990 Unspecified osteoarthritis, unspecified site: Secondary | ICD-10-CM | POA: Diagnosis not present

## 2023-09-01 DIAGNOSIS — S72001D Fracture of unspecified part of neck of right femur, subsequent encounter for closed fracture with routine healing: Secondary | ICD-10-CM | POA: Diagnosis not present

## 2023-09-01 DIAGNOSIS — R41841 Cognitive communication deficit: Secondary | ICD-10-CM | POA: Diagnosis not present

## 2023-09-01 DIAGNOSIS — M1990 Unspecified osteoarthritis, unspecified site: Secondary | ICD-10-CM | POA: Diagnosis not present

## 2023-09-01 DIAGNOSIS — N319 Neuromuscular dysfunction of bladder, unspecified: Secondary | ICD-10-CM | POA: Diagnosis not present

## 2023-09-01 DIAGNOSIS — R262 Difficulty in walking, not elsewhere classified: Secondary | ICD-10-CM | POA: Diagnosis not present

## 2023-09-01 DIAGNOSIS — R1314 Dysphagia, pharyngoesophageal phase: Secondary | ICD-10-CM | POA: Diagnosis not present

## 2023-09-02 DIAGNOSIS — M1990 Unspecified osteoarthritis, unspecified site: Secondary | ICD-10-CM | POA: Diagnosis not present

## 2023-09-02 DIAGNOSIS — R262 Difficulty in walking, not elsewhere classified: Secondary | ICD-10-CM | POA: Diagnosis not present

## 2023-09-02 DIAGNOSIS — R41841 Cognitive communication deficit: Secondary | ICD-10-CM | POA: Diagnosis not present

## 2023-09-02 DIAGNOSIS — S72001D Fracture of unspecified part of neck of right femur, subsequent encounter for closed fracture with routine healing: Secondary | ICD-10-CM | POA: Diagnosis not present

## 2023-09-02 DIAGNOSIS — N319 Neuromuscular dysfunction of bladder, unspecified: Secondary | ICD-10-CM | POA: Diagnosis not present

## 2023-09-02 DIAGNOSIS — R1314 Dysphagia, pharyngoesophageal phase: Secondary | ICD-10-CM | POA: Diagnosis not present

## 2023-09-03 DIAGNOSIS — N319 Neuromuscular dysfunction of bladder, unspecified: Secondary | ICD-10-CM | POA: Diagnosis not present

## 2023-09-03 DIAGNOSIS — R1314 Dysphagia, pharyngoesophageal phase: Secondary | ICD-10-CM | POA: Diagnosis not present

## 2023-09-03 DIAGNOSIS — H40012 Open angle with borderline findings, low risk, left eye: Secondary | ICD-10-CM | POA: Diagnosis not present

## 2023-09-03 DIAGNOSIS — M1990 Unspecified osteoarthritis, unspecified site: Secondary | ICD-10-CM | POA: Diagnosis not present

## 2023-09-03 DIAGNOSIS — H353122 Nonexudative age-related macular degeneration, left eye, intermediate dry stage: Secondary | ICD-10-CM | POA: Diagnosis not present

## 2023-09-03 DIAGNOSIS — H401411 Capsular glaucoma with pseudoexfoliation of lens, right eye, mild stage: Secondary | ICD-10-CM | POA: Diagnosis not present

## 2023-09-03 DIAGNOSIS — Z961 Presence of intraocular lens: Secondary | ICD-10-CM | POA: Diagnosis not present

## 2023-09-03 DIAGNOSIS — H18593 Other hereditary corneal dystrophies, bilateral: Secondary | ICD-10-CM | POA: Diagnosis not present

## 2023-09-03 DIAGNOSIS — R262 Difficulty in walking, not elsewhere classified: Secondary | ICD-10-CM | POA: Diagnosis not present

## 2023-09-03 DIAGNOSIS — S72001D Fracture of unspecified part of neck of right femur, subsequent encounter for closed fracture with routine healing: Secondary | ICD-10-CM | POA: Diagnosis not present

## 2023-09-03 DIAGNOSIS — R41841 Cognitive communication deficit: Secondary | ICD-10-CM | POA: Diagnosis not present

## 2023-09-03 DIAGNOSIS — H1045 Other chronic allergic conjunctivitis: Secondary | ICD-10-CM | POA: Diagnosis not present

## 2023-09-04 DIAGNOSIS — R262 Difficulty in walking, not elsewhere classified: Secondary | ICD-10-CM | POA: Diagnosis not present

## 2023-09-04 DIAGNOSIS — N319 Neuromuscular dysfunction of bladder, unspecified: Secondary | ICD-10-CM | POA: Diagnosis not present

## 2023-09-04 DIAGNOSIS — S72001D Fracture of unspecified part of neck of right femur, subsequent encounter for closed fracture with routine healing: Secondary | ICD-10-CM | POA: Diagnosis not present

## 2023-09-04 DIAGNOSIS — M1990 Unspecified osteoarthritis, unspecified site: Secondary | ICD-10-CM | POA: Diagnosis not present

## 2023-09-04 DIAGNOSIS — R0981 Nasal congestion: Secondary | ICD-10-CM | POA: Diagnosis not present

## 2023-09-04 DIAGNOSIS — R41841 Cognitive communication deficit: Secondary | ICD-10-CM | POA: Diagnosis not present

## 2023-09-04 DIAGNOSIS — K219 Gastro-esophageal reflux disease without esophagitis: Secondary | ICD-10-CM | POA: Diagnosis not present

## 2023-09-04 DIAGNOSIS — R1310 Dysphagia, unspecified: Secondary | ICD-10-CM | POA: Diagnosis not present

## 2023-09-04 DIAGNOSIS — R059 Cough, unspecified: Secondary | ICD-10-CM | POA: Diagnosis not present

## 2023-09-04 DIAGNOSIS — R1314 Dysphagia, pharyngoesophageal phase: Secondary | ICD-10-CM | POA: Diagnosis not present

## 2023-09-05 DIAGNOSIS — R1314 Dysphagia, pharyngoesophageal phase: Secondary | ICD-10-CM | POA: Diagnosis not present

## 2023-09-05 DIAGNOSIS — S72001D Fracture of unspecified part of neck of right femur, subsequent encounter for closed fracture with routine healing: Secondary | ICD-10-CM | POA: Diagnosis not present

## 2023-09-05 DIAGNOSIS — N319 Neuromuscular dysfunction of bladder, unspecified: Secondary | ICD-10-CM | POA: Diagnosis not present

## 2023-09-05 DIAGNOSIS — R41841 Cognitive communication deficit: Secondary | ICD-10-CM | POA: Diagnosis not present

## 2023-09-05 DIAGNOSIS — R262 Difficulty in walking, not elsewhere classified: Secondary | ICD-10-CM | POA: Diagnosis not present

## 2023-09-05 DIAGNOSIS — M1990 Unspecified osteoarthritis, unspecified site: Secondary | ICD-10-CM | POA: Diagnosis not present

## 2023-09-06 DIAGNOSIS — R41841 Cognitive communication deficit: Secondary | ICD-10-CM | POA: Diagnosis not present

## 2023-09-06 DIAGNOSIS — R262 Difficulty in walking, not elsewhere classified: Secondary | ICD-10-CM | POA: Diagnosis not present

## 2023-09-06 DIAGNOSIS — M1990 Unspecified osteoarthritis, unspecified site: Secondary | ICD-10-CM | POA: Diagnosis not present

## 2023-09-06 DIAGNOSIS — N319 Neuromuscular dysfunction of bladder, unspecified: Secondary | ICD-10-CM | POA: Diagnosis not present

## 2023-09-06 DIAGNOSIS — S72001D Fracture of unspecified part of neck of right femur, subsequent encounter for closed fracture with routine healing: Secondary | ICD-10-CM | POA: Diagnosis not present

## 2023-09-06 DIAGNOSIS — R1314 Dysphagia, pharyngoesophageal phase: Secondary | ICD-10-CM | POA: Diagnosis not present

## 2023-09-07 DIAGNOSIS — R1310 Dysphagia, unspecified: Secondary | ICD-10-CM | POA: Diagnosis not present

## 2023-09-08 DIAGNOSIS — S72001D Fracture of unspecified part of neck of right femur, subsequent encounter for closed fracture with routine healing: Secondary | ICD-10-CM | POA: Diagnosis not present

## 2023-09-08 DIAGNOSIS — N319 Neuromuscular dysfunction of bladder, unspecified: Secondary | ICD-10-CM | POA: Diagnosis not present

## 2023-09-08 DIAGNOSIS — R1314 Dysphagia, pharyngoesophageal phase: Secondary | ICD-10-CM | POA: Diagnosis not present

## 2023-09-08 DIAGNOSIS — R262 Difficulty in walking, not elsewhere classified: Secondary | ICD-10-CM | POA: Diagnosis not present

## 2023-09-08 DIAGNOSIS — R41841 Cognitive communication deficit: Secondary | ICD-10-CM | POA: Diagnosis not present

## 2023-09-08 DIAGNOSIS — M1990 Unspecified osteoarthritis, unspecified site: Secondary | ICD-10-CM | POA: Diagnosis not present

## 2023-09-09 DIAGNOSIS — S72001D Fracture of unspecified part of neck of right femur, subsequent encounter for closed fracture with routine healing: Secondary | ICD-10-CM | POA: Diagnosis not present

## 2023-09-09 DIAGNOSIS — R262 Difficulty in walking, not elsewhere classified: Secondary | ICD-10-CM | POA: Diagnosis not present

## 2023-09-09 DIAGNOSIS — R1314 Dysphagia, pharyngoesophageal phase: Secondary | ICD-10-CM | POA: Diagnosis not present

## 2023-09-09 DIAGNOSIS — M1990 Unspecified osteoarthritis, unspecified site: Secondary | ICD-10-CM | POA: Diagnosis not present

## 2023-09-09 DIAGNOSIS — N319 Neuromuscular dysfunction of bladder, unspecified: Secondary | ICD-10-CM | POA: Diagnosis not present

## 2023-09-09 DIAGNOSIS — R41841 Cognitive communication deficit: Secondary | ICD-10-CM | POA: Diagnosis not present

## 2023-09-10 DIAGNOSIS — M1990 Unspecified osteoarthritis, unspecified site: Secondary | ICD-10-CM | POA: Diagnosis not present

## 2023-09-10 DIAGNOSIS — R1314 Dysphagia, pharyngoesophageal phase: Secondary | ICD-10-CM | POA: Diagnosis not present

## 2023-09-10 DIAGNOSIS — R41841 Cognitive communication deficit: Secondary | ICD-10-CM | POA: Diagnosis not present

## 2023-09-10 DIAGNOSIS — S72001D Fracture of unspecified part of neck of right femur, subsequent encounter for closed fracture with routine healing: Secondary | ICD-10-CM | POA: Diagnosis not present

## 2023-09-10 DIAGNOSIS — N319 Neuromuscular dysfunction of bladder, unspecified: Secondary | ICD-10-CM | POA: Diagnosis not present

## 2023-09-10 DIAGNOSIS — R262 Difficulty in walking, not elsewhere classified: Secondary | ICD-10-CM | POA: Diagnosis not present

## 2023-09-11 DIAGNOSIS — D649 Anemia, unspecified: Secondary | ICD-10-CM | POA: Diagnosis not present

## 2023-09-11 DIAGNOSIS — N319 Neuromuscular dysfunction of bladder, unspecified: Secondary | ICD-10-CM | POA: Diagnosis not present

## 2023-09-11 DIAGNOSIS — M1990 Unspecified osteoarthritis, unspecified site: Secondary | ICD-10-CM | POA: Diagnosis not present

## 2023-09-11 DIAGNOSIS — R1314 Dysphagia, pharyngoesophageal phase: Secondary | ICD-10-CM | POA: Diagnosis not present

## 2023-09-11 DIAGNOSIS — S72001D Fracture of unspecified part of neck of right femur, subsequent encounter for closed fracture with routine healing: Secondary | ICD-10-CM | POA: Diagnosis not present

## 2023-09-11 DIAGNOSIS — R262 Difficulty in walking, not elsewhere classified: Secondary | ICD-10-CM | POA: Diagnosis not present

## 2023-09-11 DIAGNOSIS — R41841 Cognitive communication deficit: Secondary | ICD-10-CM | POA: Diagnosis not present

## 2023-09-11 DIAGNOSIS — I10 Essential (primary) hypertension: Secondary | ICD-10-CM | POA: Diagnosis not present

## 2023-09-12 DIAGNOSIS — N319 Neuromuscular dysfunction of bladder, unspecified: Secondary | ICD-10-CM | POA: Diagnosis not present

## 2023-09-12 DIAGNOSIS — R1314 Dysphagia, pharyngoesophageal phase: Secondary | ICD-10-CM | POA: Diagnosis not present

## 2023-09-12 DIAGNOSIS — S72001D Fracture of unspecified part of neck of right femur, subsequent encounter for closed fracture with routine healing: Secondary | ICD-10-CM | POA: Diagnosis not present

## 2023-09-12 DIAGNOSIS — R41841 Cognitive communication deficit: Secondary | ICD-10-CM | POA: Diagnosis not present

## 2023-09-14 DIAGNOSIS — R41841 Cognitive communication deficit: Secondary | ICD-10-CM | POA: Diagnosis not present

## 2023-09-14 DIAGNOSIS — S72001D Fracture of unspecified part of neck of right femur, subsequent encounter for closed fracture with routine healing: Secondary | ICD-10-CM | POA: Diagnosis not present

## 2023-09-14 DIAGNOSIS — N319 Neuromuscular dysfunction of bladder, unspecified: Secondary | ICD-10-CM | POA: Diagnosis not present

## 2023-09-14 DIAGNOSIS — R1314 Dysphagia, pharyngoesophageal phase: Secondary | ICD-10-CM | POA: Diagnosis not present

## 2023-09-15 ENCOUNTER — Other Ambulatory Visit

## 2023-09-15 DIAGNOSIS — N319 Neuromuscular dysfunction of bladder, unspecified: Secondary | ICD-10-CM | POA: Diagnosis not present

## 2023-09-15 DIAGNOSIS — R1314 Dysphagia, pharyngoesophageal phase: Secondary | ICD-10-CM | POA: Diagnosis not present

## 2023-09-15 DIAGNOSIS — S72001D Fracture of unspecified part of neck of right femur, subsequent encounter for closed fracture with routine healing: Secondary | ICD-10-CM | POA: Diagnosis not present

## 2023-09-15 DIAGNOSIS — R41841 Cognitive communication deficit: Secondary | ICD-10-CM | POA: Diagnosis not present

## 2023-09-16 ENCOUNTER — Encounter: Payer: Self-pay | Admitting: Physician Assistant

## 2023-09-16 ENCOUNTER — Ambulatory Visit (INDEPENDENT_AMBULATORY_CARE_PROVIDER_SITE_OTHER): Admitting: Physician Assistant

## 2023-09-16 VITALS — BP 120/58 | HR 95 | Ht 65.0 in | Wt 114.0 lb

## 2023-09-16 DIAGNOSIS — R1084 Generalized abdominal pain: Secondary | ICD-10-CM

## 2023-09-16 DIAGNOSIS — R1310 Dysphagia, unspecified: Secondary | ICD-10-CM

## 2023-09-16 DIAGNOSIS — R634 Abnormal weight loss: Secondary | ICD-10-CM

## 2023-09-16 DIAGNOSIS — K8020 Calculus of gallbladder without cholecystitis without obstruction: Secondary | ICD-10-CM | POA: Diagnosis not present

## 2023-09-16 DIAGNOSIS — R112 Nausea with vomiting, unspecified: Secondary | ICD-10-CM | POA: Diagnosis not present

## 2023-09-16 DIAGNOSIS — R41841 Cognitive communication deficit: Secondary | ICD-10-CM | POA: Diagnosis not present

## 2023-09-16 DIAGNOSIS — R630 Anorexia: Secondary | ICD-10-CM | POA: Diagnosis not present

## 2023-09-16 DIAGNOSIS — N319 Neuromuscular dysfunction of bladder, unspecified: Secondary | ICD-10-CM | POA: Diagnosis not present

## 2023-09-16 DIAGNOSIS — K551 Chronic vascular disorders of intestine: Secondary | ICD-10-CM

## 2023-09-16 DIAGNOSIS — S72001D Fracture of unspecified part of neck of right femur, subsequent encounter for closed fracture with routine healing: Secondary | ICD-10-CM | POA: Diagnosis not present

## 2023-09-16 DIAGNOSIS — R1314 Dysphagia, pharyngoesophageal phase: Secondary | ICD-10-CM | POA: Diagnosis not present

## 2023-09-16 DIAGNOSIS — W44F3XA Food entering into or through a natural orifice, initial encounter: Secondary | ICD-10-CM

## 2023-09-16 MED ORDER — SUCRALFATE 1 GM/10ML PO SUSP: 1.0000 g | Freq: Four times a day (QID) | ORAL | 1 refills | Status: AC

## 2023-09-16 NOTE — Addendum Note (Signed)
 Addended by: KATHIE BOTTCHER E on: 09/16/2023 03:44 PM   Modules accepted: Orders

## 2023-09-16 NOTE — Patient Instructions (Addendum)
 _______________________________________________________  If your blood pressure at your visit was 140/90 or greater, please contact your primary care physician to follow up on this.  _______________________________________________________  If you are age 88 or older, your body mass index should be between 23-30. Your Body mass index is 18.97 kg/m. If this is out of the aforementioned range listed, please consider follow up with your Primary Care Provider.  If you are age 42 or younger, your body mass index should be between 19-25. Your Body mass index is 18.97 kg/m. If this is out of the aformentioned range listed, please consider follow up with your Primary Care Provider.   ________________________________________________________  The [facility] providers would like to encourage you to use MYCHART to communicate with providers for non-urgent requests or questions.  Due to long hold times on the telephone, sending your provider a message by [facility] may be a faster and more efficient way to get a response.  Please allow 48 business hours for a response.  Please remember that this is for non-urgent requests.  _______________________________________________________  [facility] is using a team-based approach to care.  Your team is made up of your doctor and two to three APPS. Our APPS (Nurse Practitioners and Physician Assistants) work with your physician to ensure care continuity for you. They are fully qualified to address your health concerns and develop a treatment plan. They communicate directly with your gastroenterologist to care for you. Seeing the Advanced Practice Practitioners on your physician's team can help you by facilitating care more promptly, often allowing for earlier appointments, access to diagnostic testing, procedures, and other specialty referrals.   You have been scheduled for a Barium Esophogram at [facility] on 09-25-23 at 10am. Please arrive 30  minutes prior to your appointment for registration. Make certain not to have anything to eat or drink 3 hours prior to your test. If you need to reschedule for any reason, please contact radiology at [phone] to do so. __________________________________________________________________ A barium swallow is an examination that concentrates on views of the esophagus. This tends to be a double contrast exam (barium and two liquids which, when combined, create a gas to distend the wall of the oesophagus) or single contrast (non-ionic iodine  based). The study is usually tailored to your symptoms so a good history is essential. Attention is paid during the study to the form, structure and configuration of the esophagus, looking for functional disorders (such as aspiration, dysphagia, achalasia, motility and reflux) EXAMINATION You may be asked to change into a gown, depending on the type of swallow being performed. A radiologist and radiographer will perform the procedure. The radiologist will advise you of the type of contrast selected for your procedure and direct you during the exam. You will be asked to stand, sit or lie in several different positions and to hold a small amount of fluid in your mouth before being asked to swallow while the imaging is performed .In some instances you may be asked to swallow barium coated marshmallows to assess the motility of a solid food bolus. The exam can be recorded as a digital or video fluoroscopy procedure. POST PROCEDURE It will take 1-2 days for the barium to pass through your system. To facilitate this, it is important, unless otherwise directed, to increase your fluids for the next 24-48hrs and to resume your normal diet.  This test typically takes about 30 minutes to perform. __________________________________________________________________________________   Megan Foster have been scheduled for a CT scan of the abdomen  and pelvis at [facility] ([address] [place], [place], [place] [address]).   You are scheduled on 10-07-23 at 1pm. You should arrive by 11am your appointment time for registration. Please follow the written instructions below on the day of your exam:  WARNING: IF YOU ARE ALLERGIC TO IODINE /X-RAY DYE, PLEASE NOTIFY RADIOLOGY IMMEDIATELY AT [phone]! YOU WILL BE GIVEN A 13 HOUR PREMEDICATION PREP.  1) Do not eat after 9am (4 hours prior to your test)  Only clear liquids 2) You will be given 2 bottles of oral contrast to drink on site.    Drink 1 bottle of contrast @  11am (2 hours prior to your exam)  Drink 1 bottle of contrast @  12pm (1 hour prior to your exam)  You may take any medications as prescribed with a small amount of water , if necessary. If you take any of the following medications: METFORMIN, GLUCOPHAGE, GLUCOVANCE, AVANDAMET, RIOMET, FORTAMET, ACTOPLUS MET, JANUMET, GLUMETZA or METAGLIP, you MAY be asked to HOLD this medication 48 hours AFTER the exam.  The purpose of you drinking the oral contrast is to aid in the visualization of your intestinal tract. The contrast solution may cause some diarrhea. Depending on your individual set of symptoms, you may also receive an intravenous injection of x-ray contrast/dye. Plan on being at [facility] for 30 minutes or longer, depending on the type of exam you are having performed.  This test typically takes 30-45 minutes to complete.  If you have any questions regarding your exam or if you need to reschedule, you may call the CT department at [phone] between the hours of 8:00 am and 5:00 pm, Monday-Friday.  You have been scheduled for an endoscopy. cASE NUMBER [id number]  Please follow written instructions given to you at your visit today.  If you use inhalers (even only as needed), please bring them with you on the day of your procedure.  If you take any of the following medications, they will need to be adjusted prior to your procedure:   DO NOT TAKE 7 DAYS  PRIOR TO TEST- Trulicity (dulaglutide) Ozempic, Wegovy (semaglutide) Mounjaro (tirzepatide) Bydureon Bcise (exanatide extended release)  DO NOT TAKE 1 DAY PRIOR TO YOUR TEST Rybelsus (semaglutide) Adlyxin (lixisenatide) Victoza (liraglutide) Byetta (exanatide) ___________________________________________________________________________     VISIT SUMMARY:  Today, we discussed your ongoing issues with swallowing and significant weight loss. We reviewed your history of esophageal narrowing and the recent episode of food impaction. We also talked about your nausea and the presence of a hiatal hernia. We have made a plan to address these concerns and improve your quality of life.  YOUR PLAN:  -ESOPHAGEAL STRICTURE WITH DYSPHAGIA AND UNINTENTIONAL WEIGHT LOSS: Esophageal stricture means that your esophagus has become narrowed, making it difficult to swallow. This has led to significant weight loss. We will schedule an endoscopy to dilate (widen) your esophagus and perform a CT scan of your abdomen and pelvis. You will also have a barium swallow test to assess the narrowing and function of your esophagus. We will restart Carafate  in liquid form and continue your current medication, pantoprazole  twice a day . Please continue to consume nutritional drinks like Ensure and eat soft foods to maintain your weight.  INSTRUCTIONS:  Please follow up with the scheduled endoscopy and barium swallow test. Continue taking your medications as prescribed and maintain your nutritional intake with Ensure and soft foods. If you experience any new or worsening symptoms, contact our office immediately.  You  can add on protein and things high calorie for weight gain - Can add ensure/boost to ice cream for a shake - can add protein powder to oatmeal after cooked or to a fruit smooth - avocado has high calorie, good to add to proteins - nuts and peanut butter are good to eat or add to yogurt/smoothies - [place]  yogurt is good  Dysphagia precautions:  2. Begin meals with warm beverage 3. Eat smaller more frequent meals 4. Eat slowly, taking small bites and sips 5. Alternate solids and liquids 6. Avoid foods/liquids that increase acid production 7. Sit upright during and for 30+ minutes after meals to facilitate esophageal clearing 8. All meats should be chopped finely.   If something gets hung in your esophagus and will not come up or go down, proceed to the emergency room.

## 2023-09-16 NOTE — Progress Notes (Signed)
 09/16/2023 Megan Foster [id number] 04/25/1927  Referring provider: Albina GORMAN Dine, MD Primary GI doctor: Dr. San  ASSESSMENT AND PLAN:  Dysphagia with history of stenosis and recent food impaction, now with nausea and feeling of fullness, occ vomiting, weight loss, decreased appetite EGD (03/08/2019, Dr. San): 1 cm stenosis at 35 cm dilated with 19 mm TTS balloon then fractured with forceps, 2 cm HH, benign fundic gland polyps 09/02/2022 MBS showed hesitation midway through the esophagus but compensated with liquids and good chewing.,  No signs of aspiration 07/26/2023 with EGD, complicated by esophageal dysmotility, GERD - Order barium swallow to assess esophageal narrowing and function. - Order CT scan of abdomen and pelvis with weight loss - Schedule endoscopy for esophageal dilation at the hospital due to mobility/age after the barium swallow, discussed risk with patient and she would prefer to proceed with EGD. I discussed risks of EGD with patient today, including risk of sedation, bleeding or perforation. Patient provides understanding and gave verbal consent to proceed. - Restart Carafate  in liquid form, patient felt this was normal.  - Continue pantoprazole  40 mg twice daily. - Encourage nutritional intake with Ensure and soft foods.  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 Previously treated by PTA of SMA 04/2012  Idiopathic pulmonary fibrosis Recent CT chest high-resolution 06/27/2023 No SOB at this time  CAD Seen on CT chest high-resolution  Cholelithiasis Seen on CT chest Monitor for symptoms Lab Results  Component Value Date   ALT 18 05/28/2022   AST 71 (H) 05/28/2022   ALKPHOS 46 05/28/2022   BILITOT 1.0 05/28/2022   Getting suprapubic catheter  Wt Readings from Last 10 Encounters:  09/16/23 114 lb (51.7 kg)  08/11/23 123 lb (55.8 kg)  07/26/23 120 lb (54.4 kg)  06/24/23 125 lb (56.7 kg)  06/03/23 128 lb (58.1 kg)   08/13/22 116 lb (52.6 kg)  07/11/22 136 lb 11 oz (62 kg)  05/25/22 137 lb (62.1 kg)  12/04/21 142 lb (64.4 kg)  02/20/21 142 lb 4.8 oz (64.5 kg)     Patient Care Team: Albina GORMAN Dine, MD as PCP - General (Internal Medicine) Obie Princella HERO, MD (Inactive) as Attending Physician (Gastroenterology) [place], Peter M, MD as Consulting Physician (Cardiology)  HISTORY OF PRESENT ILLNESS: 89 y.o. female with a past medical history of hypertension, dementia, hiatal hernia, GERD, esophageal stenosis, previous food impaction 01/2019, esophageal dysmotility, family history of esophageal cancer (brother in 75s), chronic mesenteric ischemia secondary to SMA stenosis (previously treated by PTA of SMA 04/2012 with resolution of pain) and others listed below presents for evaluation of dysphagia with weight loss.   Last seen in the office 08/13/2022 by Con Blower, PA for dysphagia. Recent hospital admission 07/26/2023 for food impaction status post EGD Dr. Kristie which showed inflamed mucosa in the entire esophagus, food lodged lower third of the esophagus moved in the stomach, multiple small sessile gastric polyps, small hiatal hernia normal duodenum no specimens collected.   Continued on pantoprazole  40 mg and Carafate  1 g 3 times daily between meals for 1 month  Discussed the use of AI scribe software for clinical note transcription with the patient, who gave verbal consent to proceed.  History of Present Illness   Megan Foster is a 89 year old female with esophageal narrowing who presents with difficulty swallowing and weight loss. She is accompanied by Neville, her caregiver.  She has a history of esophageal narrowing at the lower end, which has previously required  dilation procedures. Her symptoms were well-managed until a recent episode where chicken noodles became impacted, leading to a recurrence of swallowing difficulties.  Since the recent food impaction, she experiences a sensation of  fullness in the lower esophagus, particularly with liquids. She has nausea and regurgitation of both liquids and solids. No painful swallowing or abdominal pain during eating.  She has experienced significant weight loss, approximately 20 pounds since April, dropping from 128 pounds to 114 pounds. She attributes this to a loss of appetite and difficulty eating, despite efforts to consume soft and liquid foods like Ensure and chocolate milk.  Her current medications include pantoprazole  40 mg twice daily, and she has previously used Carafate , which she found helpful. She takes her medications cautiously, swallowing one pill at a time due to swallowing difficulties.  She also has a suprapubic catheter, which has been in place for a year, and reports some pelvic discomfort associated with it. She is managing her mobility with a walker and wheelchair, as she cannot walk unaided.     She  reports that she has never smoked. She has never used smokeless tobacco. She reports that she does not drink alcohol  and does not use drugs.  RELEVANT GI HISTORY, IMAGING AND LABS: -EGD (03/08/2019, Dr. San): 1 cm stenosis at 35 cm dilated with 19 mm TTS balloon then fractured with forceps, 2 cm HH, benign fundic gland polyps -EGD (01/2019, Dr. San; food impaction): Food bolus in lower one third removed, moderate intrinsic stenosis at 35 cm dilated with endoscope passage alone with 2 mucosal rents, 2 cm HH, gastric polyps -EGD (03/2012, Dr. Obie): 2 cm HH, mild gastritis -EGD (02/2008, Dr. Obie): Normal esophagus, empiric dilation with 16 mm Savary dilator without mucosal rent -EGD (a 02/2006, Dr. Obie): Normal esophagus, 15 mm gastric polyp -EGD (02/2001, Dr. Obie): Normal   -UGI series (09/2006): Small hiatal hernia, mild Schatzki's ring, mild esophageal dysmotility noted CBC    Component Value Date/Time   WBC 5.3 08/11/2023 0822   RBC 4.52 08/11/2023 0822   HGB 14.2 08/11/2023 0822   HCT 43.1  08/11/2023 0822   PLT 223 08/11/2023 0822   MCV 95.4 08/11/2023 0822   MCH 31.4 08/11/2023 0822   MCHC 32.9 08/11/2023 0822   RDW 13.2 08/11/2023 0822   LYMPHSABS 1.4 07/26/2023 1135   MONOABS 0.5 07/26/2023 1135   EOSABS 0.2 07/26/2023 1135   BASOSABS 0.0 07/26/2023 1135   Recent Labs    07/26/23 1135 08/11/23 0822  HGB 12.7 14.2    CMP     Component Value Date/Time   NA 135 07/26/2023 1135   K 3.7 07/26/2023 1135   CL 103 07/26/2023 1135   CO2 24 07/26/2023 1135   GLUCOSE 90 07/26/2023 1135   BUN 10 07/26/2023 1135   CREATININE 0.73 07/26/2023 1135   CREATININE 1.03 11/13/2012 1305   CALCIUM  9.3 07/26/2023 1135   PROT 5.1 (L) 05/28/2022 0340   ALBUMIN  2.7 (L) 05/28/2022 0340   AST 71 (H) 05/28/2022 0340   ALT 18 05/28/2022 0340   ALKPHOS 46 05/28/2022 0340   BILITOT 1.0 05/28/2022 0340   GFRNONAA >60 07/26/2023 1135   GFRAA >60 01/30/2019 1446      Latest Ref Rng & Units 05/28/2022    3:40 AM 01/30/2019    2:46 PM  Hepatic Function  Total Protein 6.5 - 8.1 g/dL 5.1  7.3   Albumin  3.5 - 5.0 g/dL 2.7  4.1   AST 15 - 41 U/L 71  21   ALT 0 - 44 U/L 18  16   Alk Phosphatase 38 - 126 U/L 46  83   Total Bilirubin 0.3 - 1.2 mg/dL 1.0  1.0       Current Medications:   Current Outpatient Medications (Endocrine & Metabolic):    levothyroxine  (SYNTHROID , LEVOTHROID) 88 MCG tablet, Take 88 mcg by mouth daily before breakfast. *BRAND NAME ONLY  Current Outpatient Medications (Cardiovascular):    atorvastatin  (LIPITOR) 10 MG tablet, Take 10 mg by mouth daily.   atorvastatin  (LIPITOR) 20 MG tablet, Take 20 mg by mouth at bedtime.   metoprolol  succinate (TOPROL -XL) 50 MG 24 hr tablet, TAKE ONE TABLET BY MOUTH ONE TIME DAILY (Patient taking differently: Take 50 mg by mouth daily.)  Current Outpatient Medications (Respiratory):    fexofenadine (ALLEGRA) 180 MG tablet, Take 180 mg by mouth every morning.   fluticasone  (FLONASE ) 50 MCG/ACT nasal spray, Place 2 sprays into  the nose daily.   ipratropium (ATROVENT ) 0.03 % nasal spray, Place 2 sprays into both nostrils daily.   levocetirizine (XYZAL) 5 MG tablet, Take 5 mg by mouth every evening.   montelukast  (SINGULAIR ) 10 MG tablet, Take 10 mg by mouth at bedtime.   triamcinolone (NASACORT ALLERGY 24HR) 55 MCG/ACT AERO nasal inhaler, Place 2 sprays into the nose daily.  Current Outpatient Medications (Analgesics):    acetaminophen  (TYLENOL ) 500 MG tablet, Take 1,000 mg by mouth in the morning and at bedtime.   HYDROcodone -acetaminophen  (NORCO/VICODIN) 5-325 MG tablet, Take 1 tablet by mouth every 4 (four) hours as needed for moderate pain or severe pain.   Current Outpatient Medications (Other):    brimonidine (ALPHAGAN) 0.2 % ophthalmic solution, Place 1 drop into both eyes 3 (three) times daily.   dorzolamide -timolol  (COSOPT ) 2-0.5 % ophthalmic solution, Place 1 drop into the right eye 2 (two) times daily.   famotidine  (PEPCID ) 40 MG tablet, Take 1 tablet (40 mg total) by mouth at bedtime.   latanoprost  (XALATAN ) 0.005 % ophthalmic solution, Place 1 drop into both eyes at bedtime.   MICONAZOLE 3 200 MG vaginal suppository, Place 200 mg vaginally at bedtime.   Multiple Vitamins-Minerals (PRESERVISION AREDS 2 PO), Take 1 tablet by mouth 2 (two) times daily.    ondansetron  (ZOFRAN ) 4 MG tablet, Take 4 mg by mouth every 8 (eight) hours as needed for nausea or vomiting.   pantoprazole  (PROTONIX ) 40 MG tablet, TAKE 1 TABLET BY MOUTH 2 TIMES DAILY (Patient taking differently: Take 40 mg by mouth 2 (two) times daily.)   sodium chloride  (MURO 128) 2 % ophthalmic solution, Place 1 drop into both eyes in the morning, at noon, in the evening, and at bedtime.   sodium chloride  (MURO 128) 5 % ophthalmic ointment, Place 1 Application into both eyes at bedtime.   sucralfate  (CARAFATE ) 1 GM/10ML suspension, Take 10 mLs (1 g total) by mouth 4 (four) times daily.   tamsulosin (FLOMAX) 0.4 MG CAPS capsule, Take 0.8 mg by mouth  daily.   triamcinolone cream (KENALOG) 0.5 %, Apply 1 Application topically daily as needed (for irritation on hands).  Medical History:  Past Medical History:  Diagnosis Date   Allergy    environmental per pt   Arthritis    Asthma    Dementia (HCC)    Depression    Elevated LFTs    Esophageal dysmotility    GERD (gastroesophageal reflux disease)    Glaucoma    Headache(784.0)    Hiatal hernia    Hyperplastic polyps of  stomach    Hypertension    Hypothyroid    Irritable bowel syndrome    Macular degeneration    Nausea & vomiting 04/02/2012   Osteoporosis    Shingles    Sinusitis    Tachycardia    Vitamin D  deficiency    Allergies: No Known Allergies   Surgical History:  She  has a past surgical history that includes Total abdominal hysterectomy (1999); Multiple tooth extractions; Aortogram (04/16/12); Colonoscopy (2008); Esophagogastroduodenoscopy (2014); Esophagogastroduodenoscopy (Left, 01/30/2019); Foreign Body Removal (01/30/2019); Upper gastrointestinal endoscopy; Hip Arthroplasty (Right, 05/26/2022); and Esophagogastroduodenoscopy (Left, 07/26/2023). Family History:  Her family history includes Breast cancer in her sister; Cancer in her brother and sister; Colon cancer in an other family member; Colon polyps in her sister; Esophageal cancer in her brother; Heart attack in her mother; Heart attack (age of onset: 62) in her father; Heart failure in her sister; Hypertension in her brother, brother, father, and mother; Kidney disease in her brother; Ovarian cancer in an other family member; Peripheral vascular disease in her father; Prostate cancer in her maternal uncle.  REVIEW OF SYSTEMS  : All other systems reviewed and negative except where noted in the History of Present Illness.  PHYSICAL EXAM: BP (!) 120/58 (BP Location: Left Arm, Patient Position: Sitting, Cuff Size: Normal)   Pulse 95   Ht 5' 5 (1.651 m)   Wt 114 lb (51.7 kg)   BMI 18.97 kg/m  Physical Exam    MEASUREMENTS: Weight- 104. GENERAL APPEARANCE: Thin, elderly appearing,  in no apparent distress. HEENT: No cervical lymphadenopathy, unremarkable thyroid , sclerae anicteric, conjunctiva pink. RESPIRATORY: Respiratory effort normal, breath sounds equal bilaterally without rales, rhonchi, or wheezing. Lungs normal. CARDIO: Regular rate and rhythm with no murmurs, rubs, or gallops, peripheral pulses intact. ABDOMEN: Soft, non-distended, obese/protuberant, active bowel sounds in all four quadrants, no tenderness to palpation, no rebound, no mass appreciated. RECTAL: Declines. MUSCULOSKELETAL: Full range of motion, gait not testing, in wheelchair, would not be able to get table without excessive assistance SKIN: Dry, intact without rashes or lesions. No jaundice. NEURO: Alert, oriented, no focal deficits. PSYCH: Cooperative, normal mood and affect.      Alan JONELLE Coombs, PA-C 3:24 PM

## 2023-09-17 ENCOUNTER — Other Ambulatory Visit

## 2023-09-17 DIAGNOSIS — R1314 Dysphagia, pharyngoesophageal phase: Secondary | ICD-10-CM | POA: Diagnosis not present

## 2023-09-17 DIAGNOSIS — I10 Essential (primary) hypertension: Secondary | ICD-10-CM | POA: Diagnosis not present

## 2023-09-17 DIAGNOSIS — J849 Interstitial pulmonary disease, unspecified: Secondary | ICD-10-CM

## 2023-09-17 DIAGNOSIS — R41841 Cognitive communication deficit: Secondary | ICD-10-CM | POA: Diagnosis not present

## 2023-09-17 DIAGNOSIS — S72001D Fracture of unspecified part of neck of right femur, subsequent encounter for closed fracture with routine healing: Secondary | ICD-10-CM | POA: Diagnosis not present

## 2023-09-17 DIAGNOSIS — N319 Neuromuscular dysfunction of bladder, unspecified: Secondary | ICD-10-CM | POA: Diagnosis not present

## 2023-09-17 NOTE — Progress Notes (Signed)
 Agree with the assessment and plan as outlined by Quentin Mulling, PA-C. ? ?Keron Neenan, DO, FACG ? ?

## 2023-09-18 DIAGNOSIS — S72001D Fracture of unspecified part of neck of right femur, subsequent encounter for closed fracture with routine healing: Secondary | ICD-10-CM | POA: Diagnosis not present

## 2023-09-18 DIAGNOSIS — R1314 Dysphagia, pharyngoesophageal phase: Secondary | ICD-10-CM | POA: Diagnosis not present

## 2023-09-18 DIAGNOSIS — R41841 Cognitive communication deficit: Secondary | ICD-10-CM | POA: Diagnosis not present

## 2023-09-18 DIAGNOSIS — N319 Neuromuscular dysfunction of bladder, unspecified: Secondary | ICD-10-CM | POA: Diagnosis not present

## 2023-09-19 DIAGNOSIS — J309 Allergic rhinitis, unspecified: Secondary | ICD-10-CM | POA: Diagnosis not present

## 2023-09-19 DIAGNOSIS — R41841 Cognitive communication deficit: Secondary | ICD-10-CM | POA: Diagnosis not present

## 2023-09-19 DIAGNOSIS — S72001D Fracture of unspecified part of neck of right femur, subsequent encounter for closed fracture with routine healing: Secondary | ICD-10-CM | POA: Diagnosis not present

## 2023-09-19 DIAGNOSIS — E038 Other specified hypothyroidism: Secondary | ICD-10-CM | POA: Diagnosis not present

## 2023-09-19 DIAGNOSIS — K224 Dyskinesia of esophagus: Secondary | ICD-10-CM | POA: Diagnosis not present

## 2023-09-19 DIAGNOSIS — I10 Essential (primary) hypertension: Secondary | ICD-10-CM | POA: Diagnosis not present

## 2023-09-19 DIAGNOSIS — N319 Neuromuscular dysfunction of bladder, unspecified: Secondary | ICD-10-CM | POA: Diagnosis not present

## 2023-09-19 DIAGNOSIS — R1314 Dysphagia, pharyngoesophageal phase: Secondary | ICD-10-CM | POA: Diagnosis not present

## 2023-09-20 LAB — SJOGREN'S SYNDROME ANTIBODS(SSA + SSB)
SSA (Ro) (ENA) Antibody, IgG: <1 AI
SSB (La) (ENA) Antibody, IgG: <1 AI

## 2023-09-20 LAB — ANTI-NUCLEAR AB-TITER (ANA TITER): ANA Titer 1: 1:40 {titer} — ABNORMAL HIGH

## 2023-09-20 LAB — ANA: Anti Nuclear Antibody (ANA): POSITIVE — AB

## 2023-09-20 LAB — ANTI-SCLERODERMA ANTIBODY: Scleroderma (Scl-70) (ENA) Antibody, IgG: <1 AI

## 2023-09-20 LAB — CYCLIC CITRUL PEPTIDE ANTIBODY, IGG: Cyclic Citrullin Peptide Ab: <16 U

## 2023-09-20 LAB — RHEUMATOID FACTOR: Rheumatoid fact SerPl-aCnc: <10 [IU]/mL (ref <14)

## 2023-09-21 DIAGNOSIS — N319 Neuromuscular dysfunction of bladder, unspecified: Secondary | ICD-10-CM | POA: Diagnosis not present

## 2023-09-21 DIAGNOSIS — R1314 Dysphagia, pharyngoesophageal phase: Secondary | ICD-10-CM | POA: Diagnosis not present

## 2023-09-21 DIAGNOSIS — R41841 Cognitive communication deficit: Secondary | ICD-10-CM | POA: Diagnosis not present

## 2023-09-21 DIAGNOSIS — S72001D Fracture of unspecified part of neck of right femur, subsequent encounter for closed fracture with routine healing: Secondary | ICD-10-CM | POA: Diagnosis not present

## 2023-09-22 DIAGNOSIS — R41841 Cognitive communication deficit: Secondary | ICD-10-CM | POA: Diagnosis not present

## 2023-09-22 DIAGNOSIS — S72001D Fracture of unspecified part of neck of right femur, subsequent encounter for closed fracture with routine healing: Secondary | ICD-10-CM | POA: Diagnosis not present

## 2023-09-22 DIAGNOSIS — N319 Neuromuscular dysfunction of bladder, unspecified: Secondary | ICD-10-CM | POA: Diagnosis not present

## 2023-09-22 DIAGNOSIS — R1314 Dysphagia, pharyngoesophageal phase: Secondary | ICD-10-CM | POA: Diagnosis not present

## 2023-09-23 DIAGNOSIS — R41841 Cognitive communication deficit: Secondary | ICD-10-CM | POA: Diagnosis not present

## 2023-09-23 DIAGNOSIS — N319 Neuromuscular dysfunction of bladder, unspecified: Secondary | ICD-10-CM | POA: Diagnosis not present

## 2023-09-23 DIAGNOSIS — R1314 Dysphagia, pharyngoesophageal phase: Secondary | ICD-10-CM | POA: Diagnosis not present

## 2023-09-23 DIAGNOSIS — S72001D Fracture of unspecified part of neck of right femur, subsequent encounter for closed fracture with routine healing: Secondary | ICD-10-CM | POA: Diagnosis not present

## 2023-09-24 DIAGNOSIS — S72001D Fracture of unspecified part of neck of right femur, subsequent encounter for closed fracture with routine healing: Secondary | ICD-10-CM | POA: Diagnosis not present

## 2023-09-24 DIAGNOSIS — R41841 Cognitive communication deficit: Secondary | ICD-10-CM | POA: Diagnosis not present

## 2023-09-24 DIAGNOSIS — R1314 Dysphagia, pharyngoesophageal phase: Secondary | ICD-10-CM | POA: Diagnosis not present

## 2023-09-24 DIAGNOSIS — N319 Neuromuscular dysfunction of bladder, unspecified: Secondary | ICD-10-CM | POA: Diagnosis not present

## 2023-09-25 ENCOUNTER — Other Ambulatory Visit: Payer: Self-pay | Admitting: Physician Assistant

## 2023-09-25 ENCOUNTER — Ambulatory Visit (HOSPITAL_COMMUNITY)
Admission: RE | Admit: 2023-09-25 | Discharge: 2023-09-25 | Disposition: A | Discharge status: Home / Self Care | Source: Ambulatory Visit | Attending: Physician Assistant | Admitting: Physician Assistant

## 2023-09-25 ENCOUNTER — Ambulatory Visit: Payer: Self-pay | Admitting: Physician Assistant

## 2023-09-25 DIAGNOSIS — W44F3XA Food entering into or through a natural orifice, initial encounter: Secondary | ICD-10-CM

## 2023-09-25 DIAGNOSIS — R1084 Generalized abdominal pain: Secondary | ICD-10-CM

## 2023-09-25 DIAGNOSIS — K224 Dyskinesia of esophagus: Secondary | ICD-10-CM | POA: Diagnosis not present

## 2023-09-25 DIAGNOSIS — R41841 Cognitive communication deficit: Secondary | ICD-10-CM | POA: Diagnosis not present

## 2023-09-25 DIAGNOSIS — R1310 Dysphagia, unspecified: Secondary | ICD-10-CM | POA: Diagnosis not present

## 2023-09-25 DIAGNOSIS — R634 Abnormal weight loss: Secondary | ICD-10-CM

## 2023-09-25 DIAGNOSIS — K449 Diaphragmatic hernia without obstruction or gangrene: Secondary | ICD-10-CM | POA: Diagnosis not present

## 2023-09-25 DIAGNOSIS — R1314 Dysphagia, pharyngoesophageal phase: Secondary | ICD-10-CM | POA: Diagnosis not present

## 2023-09-25 DIAGNOSIS — K2289 Other specified disease of esophagus: Secondary | ICD-10-CM | POA: Diagnosis not present

## 2023-09-25 DIAGNOSIS — S72001D Fracture of unspecified part of neck of right femur, subsequent encounter for closed fracture with routine healing: Secondary | ICD-10-CM | POA: Diagnosis not present

## 2023-09-25 DIAGNOSIS — N319 Neuromuscular dysfunction of bladder, unspecified: Secondary | ICD-10-CM | POA: Diagnosis not present

## 2023-09-25 DIAGNOSIS — K551 Chronic vascular disorders of intestine: Secondary | ICD-10-CM

## 2023-09-26 DIAGNOSIS — N319 Neuromuscular dysfunction of bladder, unspecified: Secondary | ICD-10-CM | POA: Diagnosis not present

## 2023-09-26 DIAGNOSIS — S72001D Fracture of unspecified part of neck of right femur, subsequent encounter for closed fracture with routine healing: Secondary | ICD-10-CM | POA: Diagnosis not present

## 2023-09-26 DIAGNOSIS — R41841 Cognitive communication deficit: Secondary | ICD-10-CM | POA: Diagnosis not present

## 2023-09-26 DIAGNOSIS — R1314 Dysphagia, pharyngoesophageal phase: Secondary | ICD-10-CM | POA: Diagnosis not present

## 2023-09-26 NOTE — Telephone Encounter (Signed)
 Patient's sister returning call. Please advise, thank you

## 2023-09-27 NOTE — Consult Note (Signed)
 Chief Complaint: Urinary Retention. Request is for Suprapubic catheter exchange and upsize.  Referring Physician(s): Dr. Sherwood Edison  Supervising Physician: {Supervising Physician:[id number]}  Patient Status: [facility] - Out-pt  History of Present Illness: Megan Foster is a 89 y.o. female female outpatient. History of HTN, IBS, and urinary retention. IR Placed a 14 Fr Suprapubic catheter. Patient presents for suprapubic catheter exchange and upsize to a council tip.   Currently without any significant complaints. Patient alert and laying in bed,calm. Denies any fevers, headache, chest pain, SOB, cough, abdominal pain, nausea, vomiting or bleeding.   *** All labs and medications are within acceptable parameters. NKDA. Patient has been NPO since midnight.   Return precautions and treatment recommendations and follow-up discussed with the patient *** who is agreeable with the plan.    Past Medical History:  Diagnosis Date   Allergy    environmental per pt   Arthritis    Asthma    Dementia (HCC)    Depression    Elevated LFTs    Esophageal dysmotility    GERD (gastroesophageal reflux disease)    Glaucoma    Headache(784.0)    Hiatal hernia    Hyperplastic polyps of stomach    Hypertension    Hypothyroid    Irritable bowel syndrome    Macular degeneration    Nausea & vomiting 04/02/2012   Osteoporosis    Shingles    Sinusitis    Tachycardia    Vitamin D deficiency     Past Surgical History:  Procedure Laterality Date   AORTOGRAM  04/16/12   COLONOSCOPY  2008   normal   ESOPHAGOGASTRODUODENOSCOPY  2014   multiple    ESOPHAGOGASTRODUODENOSCOPY Left 01/30/2019   Procedure: ESOPHAGOGASTRODUODENOSCOPY (EGD);  Surgeon: San Sandor GAILS, DO;  Location: [facility];  Service: Gastroenterology;  Laterality: Left;   ESOPHAGOGASTRODUODENOSCOPY Left 07/26/2023   Procedure: EGD (ESOPHAGOGASTRODUODENOSCOPY);  Surgeon: Kristie Lamprey, MD;  Location: [facility];  Service:  Gastroenterology;  Laterality: Left;  Dysphagia-history of esophageal stricture   FOREIGN BODY REMOVAL  01/30/2019   Procedure: FOREIGN BODY REMOVAL;  Surgeon: San Sandor GAILS, DO;  Location: [facility];  Service: Gastroenterology;;   HIP ARTHROPLASTY Right 05/26/2022   Procedure: ARTHROPLASTY BIPOLAR HIP (HEMIARTHROPLASTY);  Surgeon: Fidel Rogue, MD;  Location: [facility];  Service: Orthopedics;  Laterality: Right;   MULTIPLE TOOTH EXTRACTIONS     TOTAL ABDOMINAL HYSTERECTOMY  1999   UPPER GASTROINTESTINAL ENDOSCOPY      Allergies: Patient has no known allergies.  Medications: Prior to Admission medications   Medication Sig Start Date End Date Taking? Authorizing Provider  acetaminophen (TYLENOL) 500 MG tablet Take 1,000 mg by mouth in the morning and at bedtime.    [provider]  atorvastatin (LIPITOR) 10 MG tablet Take 10 mg by mouth daily. 05/29/23   [provider]  atorvastatin (LIPITOR) 20 MG tablet Take 20 mg by mouth at bedtime.    [provider]  brimonidine (ALPHAGAN) 0.2 % ophthalmic solution Place 1 drop into both eyes 3 (three) times daily. 09/03/23   [provider]  dorzolamide-timolol (COSOPT) 2-0.5 % ophthalmic solution Place 1 drop into the right eye 2 (two) times daily.    [provider]  famotidine (PEPCID) 40 MG tablet Take 1 tablet (40 mg total) by mouth at bedtime. 07/26/23   Franklyn Sid SAILOR, MD  fexofenadine (ALLEGRA) 180 MG tablet Take 180 mg by mouth every morning.    [provider]  fluticasone (FLONASE) 50 MCG/ACT nasal  spray Place 2 sprays into the nose daily. 12/18/10 09/16/23  [place], Peter M, MD  HYDROcodone-acetaminophen (NORCO/VICODIN) 5-325 MG tablet Take 1 tablet by mouth every 4 (four) hours as needed for moderate pain or severe pain. 05/27/22   Leigh Valery RAMAN, PA-C  ipratropium (ATROVENT) 0.03 % nasal spray Place 2 sprays into both nostrils daily. 03/01/22   [provider]  latanoprost  (XALATAN) 0.005 % ophthalmic solution Place 1 drop into both eyes at bedtime.    [provider]  levocetirizine (XYZAL) 5 MG tablet Take 5 mg by mouth every evening.    [provider]  levothyroxine (SYNTHROID, LEVOTHROID) 88 MCG tablet Take 88 mcg by mouth daily before breakfast. *BRAND NAME ONLY    [provider]  metoprolol succinate (TOPROL-XL) 50 MG 24 hr tablet TAKE ONE TABLET BY MOUTH ONE TIME DAILY Patient taking differently: Take 50 mg by mouth daily. 09/02/12   [place], Peter M, MD  MICONAZOLE 3 200 MG vaginal suppository Place 200 mg vaginally at bedtime. 06/23/23   [provider]  montelukast (SINGULAIR) 10 MG tablet Take 10 mg by mouth at bedtime.    [provider]  Multiple Vitamins-Minerals (PRESERVISION AREDS 2 PO) Take 1 tablet by mouth 2 (two) times daily.     [provider]  ondansetron (ZOFRAN) 4 MG tablet Take 4 mg by mouth every 8 (eight) hours as needed for nausea or vomiting.    [provider]  pantoprazole (PROTONIX) 40 MG tablet TAKE 1 TABLET BY MOUTH 2 TIMES DAILY Patient taking differently: Take 40 mg by mouth 2 (two) times daily. 05/10/20   Cirigliano, Vito V, DO  sodium chloride (MURO 128) 2 % ophthalmic solution Place 1 drop into both eyes in the morning, at noon, in the evening, and at bedtime.    [provider]  sodium chloride (MURO 128) 5 % ophthalmic ointment Place 1 Application into both eyes at bedtime.    [provider]  sucralfate (CARAFATE) 1 GM/10ML suspension Take 10 mLs (1 g total) by mouth 4 (four) times daily. 09/16/23   Craig Alan SAUNDERS, PA-C  tamsulosin (FLOMAX) 0.4 MG CAPS capsule Take 0.8 mg by mouth daily. 06/08/23   [provider]  triamcinolone (NASACORT ALLERGY 24HR) 55 MCG/ACT AERO nasal inhaler Place 2 sprays into the nose daily.    [provider]  triamcinolone cream (KENALOG) 0.5 % Apply 1 Application topically daily as needed (for  irritation on hands). 01/18/19   [provider]     Family History  Problem Relation Age of Onset   Hypertension Mother    Heart attack Mother    Heart attack Father 35   Hypertension Father    Peripheral vascular disease Father    Heart failure Sister    Cancer Sister    Hypertension Brother    Cancer Brother    Hypertension Brother    Breast cancer Sister    Ovarian cancer Other        mat cousin   Prostate cancer Maternal Uncle    Colon polyps Sister        siblings   Colon cancer Other        Maternal Aunt x3 Maternal Uncle x 2   Esophageal cancer Brother        died at 87   Kidney disease Brother    Stomach cancer Neg Hx    Rectal cancer Neg Hx     Social History   Socioeconomic History  Marital status: Widowed    Spouse name: Not on file   Number of children: 1   Years of education: Not on file   Highest education level: Not on file  Occupational History   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, civil (consulting)  Tobacco Use   Smoking status: Never   Smokeless tobacco: Never  Vaping Use   Vaping status: Never Used  Substance and Sexual Activity   Alcohol use: No    Alcohol/week: 0.0 standard drinks of alcohol    Comment: <1 day   Drug use: No   Sexual activity: Not on file  Other Topics Concern   Not on file  Social History Narrative   Not on file   Social Drivers of Health   Financial Resource Strain: Not on file  Food Insecurity: No Food Insecurity (05/25/2022)   Hunger Vital Sign    Worried About Running Out of Food in the Last Year: Never true    Ran Out of Food in the Last Year: Never true  Transportation Needs: No Transportation Needs (05/25/2022)   PRAPARE - Administrator, Civil Service (Medical): No    Lack of Transportation (Non-Medical): No  Physical Activity: Not on file  Stress: Not on file  Social Connections: Not on file    ECOG Status: {CHL ONC ECOG ED:[id number]}  Review of Systems: A 12  point ROS discussed and pertinent positives are indicated in the HPI above.  All other systems are negative.  Review of Systems  Vital Signs: There were no vitals taken for this visit.  Advance Care Plan: {Advance Care Eojw:73180}    Physical Exam  Imaging: DG ESOPHAGUS W SINGLE CM (SOL OR THIN BA) Result Date: 09/25/2023 CLINICAL DATA:  Patient with history of distal esophageal dilation. She also required EGD with food bolus removal from the distal esophagus in 2020. Followed by Dr. San; request for esophogram to assess for esophageal narrowing and function. EXAM: ESOPHAGUS/BARIUM SWALLOW/TABLET STUDY TECHNIQUE: Single contrast examination was performed using thin liquid barium. This exam was performed by [place], NP, and was supervised and interpreted by Dr. Landy. FLUOROSCOPY: Radiation Exposure Index (as provided by the fluoroscopic device): 30.3 mGy Kerma COMPARISON:  None Available. FINDINGS: Swallowing: Appears normal. No significant penetration or aspiration seen. Pharynx: Unremarkable. Esophagus: Normal appearance. Esophageal motility: Esophageal dysmotility seen as tertiary contractions throughout the esophagus. Hiatal Hernia: Small hiatal hernia seen. Gastroesophageal reflux: None visualized. Ingested 13 mm barium tablet: Tablet was significantly delayed at the distal esophagus. Other: None. IMPRESSION: 1. Esophageal dysmotility seen as tertiary contractions throughout the esophagus. 2.  Small hiatal hernia seen. 3. 13 mm barium tablet was significantly delayed at the distal esophagus. Electronically Signed   By: Lynwood Landy Raddle M.D.   On: 09/25/2023 13:24    Labs:  CBC: Recent Labs    07/26/23 1135 08/11/23 0822  WBC 5.2 5.3  HGB 12.7 14.2  HCT 38.3 43.1  PLT 198 223    COAGS: Recent Labs    08/11/23 0822  INR 1.0    BMP: Recent Labs    07/26/23 1135  NA 135  K 3.7  CL 103  CO2 24  GLUCOSE 90  BUN 10  CALCIUM 9.3  CREATININE 0.73   GFRNONAA >60    LIVER FUNCTION TESTS: No results for input(s): BILITOT, AST, ALT, ALKPHOS, PROT, ALBUMIN in the last 8760 hours.  TUMOR MARKERS: No results for input(s): AFPTM, CEA, CA199, CHROMGRNA in the last 8760 hours.  Assessment and Plan:  89 y.o. female outpatient. History of HTN, IBS, and urinary retention. IR Placed a 14 Fr Suprapubic catheter. Patient presents for suprapubic catheter exchange and upsize to a council tip.   PLAN: IR Image Guided Suprapubic Catheter Placement.   Risks and benefits discussed with the patient including bleeding, infection, damage to adjacent structures, bowel perforation/fistula connection, and sepsis.  All of the patient's questions were answered, patient is agreeable to proceed. Consent signed and in chart.    Thank you for this interesting consult.  I greatly enjoyed meeting Megan Foster and look forward to participating in their care.  A copy of this report was sent to the requesting provider on this date.  Electronically Signed: Delon JAYSON Beagle, NP 09/27/2023, 3:24 PM   I spent a total of {New PWEU:[id number]} {New Out-Pt:[id number]}  {Established Out-Pt:[id number]} in face to face in clinical consultation, greater than 50% of which was counseling/coordinating care for ***

## 2023-09-29 ENCOUNTER — Other Ambulatory Visit: Payer: Self-pay

## 2023-09-29 ENCOUNTER — Ambulatory Visit (HOSPITAL_COMMUNITY)

## 2023-09-29 DIAGNOSIS — R41841 Cognitive communication deficit: Secondary | ICD-10-CM | POA: Diagnosis not present

## 2023-09-29 DIAGNOSIS — R1314 Dysphagia, pharyngoesophageal phase: Secondary | ICD-10-CM | POA: Diagnosis not present

## 2023-09-29 DIAGNOSIS — N319 Neuromuscular dysfunction of bladder, unspecified: Secondary | ICD-10-CM | POA: Diagnosis not present

## 2023-09-29 DIAGNOSIS — S72001D Fracture of unspecified part of neck of right femur, subsequent encounter for closed fracture with routine healing: Secondary | ICD-10-CM | POA: Diagnosis not present

## 2023-09-30 ENCOUNTER — Ambulatory Visit (HOSPITAL_COMMUNITY)
Admission: RE | Admit: 2023-09-30 | Discharge: 2023-09-30 | Disposition: A | Discharge status: Home / Self Care | Source: Ambulatory Visit | Attending: Student | Admitting: Student

## 2023-09-30 ENCOUNTER — Other Ambulatory Visit: Payer: Self-pay | Admitting: Student

## 2023-09-30 ENCOUNTER — Other Ambulatory Visit: Payer: Self-pay

## 2023-09-30 DIAGNOSIS — R339 Retention of urine, unspecified: Secondary | ICD-10-CM | POA: Insufficient documentation

## 2023-09-30 DIAGNOSIS — I10 Essential (primary) hypertension: Secondary | ICD-10-CM | POA: Insufficient documentation

## 2023-09-30 DIAGNOSIS — R41841 Cognitive communication deficit: Secondary | ICD-10-CM | POA: Diagnosis not present

## 2023-09-30 DIAGNOSIS — K589 Irritable bowel syndrome without diarrhea: Secondary | ICD-10-CM | POA: Insufficient documentation

## 2023-09-30 DIAGNOSIS — N319 Neuromuscular dysfunction of bladder, unspecified: Secondary | ICD-10-CM | POA: Diagnosis not present

## 2023-09-30 DIAGNOSIS — R1314 Dysphagia, pharyngoesophageal phase: Secondary | ICD-10-CM | POA: Diagnosis not present

## 2023-09-30 DIAGNOSIS — S72001D Fracture of unspecified part of neck of right femur, subsequent encounter for closed fracture with routine healing: Secondary | ICD-10-CM | POA: Diagnosis not present

## 2023-09-30 DIAGNOSIS — Z435 Encounter for attention to cystostomy: Secondary | ICD-10-CM | POA: Diagnosis not present

## 2023-09-30 HISTORY — PX: IR CYSTOSTOMY TUBE CHANGE COMPLICATED W IMG: IMG1098

## 2023-09-30 MED ORDER — LIDOCAINE VISCOUS HCL 2 % MT SOLN
OROMUCOSAL | Status: AC
  Filled 2023-09-30: qty 15

## 2023-09-30 MED ORDER — MIDAZOLAM HCL 2 MG/2ML IJ SOLN
INTRAMUSCULAR | Status: AC
  Filled 2023-09-30: qty 4

## 2023-09-30 MED ORDER — IOHEXOL 300 MG/ML  SOLN
50.0000 mL | Freq: Once | INTRAMUSCULAR | Status: AC | PRN
  Administered 2023-09-30: 10 mL

## 2023-09-30 MED ORDER — LIDOCAINE HCL 1 % IJ SOLN
INTRAMUSCULAR | Status: AC
  Filled 2023-09-30: qty 20

## 2023-09-30 MED ORDER — FENTANYL CITRATE (PF) 100 MCG/2ML IJ SOLN
INTRAMUSCULAR | Status: AC
  Filled 2023-09-30: qty 4

## 2023-09-30 MED ORDER — MIDAZOLAM HCL 2 MG/2ML IJ SOLN
INTRAMUSCULAR | Status: AC | PRN
  Administered 2023-09-30: 1 mg via INTRAVENOUS

## 2023-09-30 MED ORDER — LIDOCAINE VISCOUS HCL 2 % MT SOLN
5.0000 mL | Freq: Once | OROMUCOSAL | Status: AC
  Administered 2023-09-30: 5 mL via OROMUCOSAL

## 2023-09-30 MED ORDER — FENTANYL CITRATE (PF) 100 MCG/2ML IJ SOLN
INTRAMUSCULAR | Status: AC | PRN
  Administered 2023-09-30: 50 ug via INTRAVENOUS

## 2023-09-30 MED ORDER — SODIUM CHLORIDE 0.9 % IV SOLN: INTRAVENOUS | Status: DC

## 2023-10-01 DIAGNOSIS — R41841 Cognitive communication deficit: Secondary | ICD-10-CM | POA: Diagnosis not present

## 2023-10-01 DIAGNOSIS — R1314 Dysphagia, pharyngoesophageal phase: Secondary | ICD-10-CM | POA: Diagnosis not present

## 2023-10-01 DIAGNOSIS — S72001D Fracture of unspecified part of neck of right femur, subsequent encounter for closed fracture with routine healing: Secondary | ICD-10-CM | POA: Diagnosis not present

## 2023-10-01 DIAGNOSIS — N319 Neuromuscular dysfunction of bladder, unspecified: Secondary | ICD-10-CM | POA: Diagnosis not present

## 2023-10-02 DIAGNOSIS — N319 Neuromuscular dysfunction of bladder, unspecified: Secondary | ICD-10-CM | POA: Diagnosis not present

## 2023-10-02 DIAGNOSIS — J309 Allergic rhinitis, unspecified: Secondary | ICD-10-CM | POA: Diagnosis not present

## 2023-10-02 DIAGNOSIS — E038 Other specified hypothyroidism: Secondary | ICD-10-CM | POA: Diagnosis not present

## 2023-10-02 DIAGNOSIS — I10 Essential (primary) hypertension: Secondary | ICD-10-CM | POA: Diagnosis not present

## 2023-10-02 DIAGNOSIS — S72001D Fracture of unspecified part of neck of right femur, subsequent encounter for closed fracture with routine healing: Secondary | ICD-10-CM | POA: Diagnosis not present

## 2023-10-02 DIAGNOSIS — R41841 Cognitive communication deficit: Secondary | ICD-10-CM | POA: Diagnosis not present

## 2023-10-02 DIAGNOSIS — R1314 Dysphagia, pharyngoesophageal phase: Secondary | ICD-10-CM | POA: Diagnosis not present

## 2023-10-02 DIAGNOSIS — K224 Dyskinesia of esophagus: Secondary | ICD-10-CM | POA: Diagnosis not present

## 2023-10-03 DIAGNOSIS — S72001D Fracture of unspecified part of neck of right femur, subsequent encounter for closed fracture with routine healing: Secondary | ICD-10-CM | POA: Diagnosis not present

## 2023-10-03 DIAGNOSIS — N319 Neuromuscular dysfunction of bladder, unspecified: Secondary | ICD-10-CM | POA: Diagnosis not present

## 2023-10-03 DIAGNOSIS — R1314 Dysphagia, pharyngoesophageal phase: Secondary | ICD-10-CM | POA: Diagnosis not present

## 2023-10-03 DIAGNOSIS — R41841 Cognitive communication deficit: Secondary | ICD-10-CM | POA: Diagnosis not present

## 2023-10-06 ENCOUNTER — Encounter: Payer: Self-pay | Admitting: Pulmonary Disease

## 2023-10-06 ENCOUNTER — Ambulatory Visit (INDEPENDENT_AMBULATORY_CARE_PROVIDER_SITE_OTHER): Admitting: Pulmonary Disease

## 2023-10-06 VITALS — BP 94/54 | HR 88 | Temp 97.6°F | Ht 60.0 in | Wt 115.0 lb

## 2023-10-06 DIAGNOSIS — R41841 Cognitive communication deficit: Secondary | ICD-10-CM | POA: Diagnosis not present

## 2023-10-06 DIAGNOSIS — R1314 Dysphagia, pharyngoesophageal phase: Secondary | ICD-10-CM | POA: Diagnosis not present

## 2023-10-06 DIAGNOSIS — J849 Interstitial pulmonary disease, unspecified: Secondary | ICD-10-CM

## 2023-10-06 DIAGNOSIS — S72001D Fracture of unspecified part of neck of right femur, subsequent encounter for closed fracture with routine healing: Secondary | ICD-10-CM | POA: Diagnosis not present

## 2023-10-06 DIAGNOSIS — N319 Neuromuscular dysfunction of bladder, unspecified: Secondary | ICD-10-CM | POA: Diagnosis not present

## 2023-10-06 NOTE — Progress Notes (Signed)
 Megan Foster    [id number]    09/23/1927  Primary Care Physician:Tejan-Sie, GORMAN Dine, MD  Referring Physician: Loreli Elsie JONETTA Mickey., MD [address] [place],  [place] [address]  Chief complaint: Consult for abnormal chest x-ray  HPI: 89 y.o. who  has a past medical history of Allergy, Arthritis, Asthma, Dementia (HCC), Depression, Elevated LFTs, Esophageal dysmotility, GERD (gastroesophageal reflux disease), Glaucoma, Headache(784.0), Hiatal hernia, Hyperplastic polyps of stomach, Hypertension, Hypothyroid, Irritable bowel syndrome, Macular degeneration, Nausea & vomiting (04/02/2012), Osteoporosis, Shingles, Sinusitis, Tachycardia, and Vitamin D  deficiency.  Discussed the use of AI scribe software for clinical note transcription with the patient, who gave verbal consent to proceed.  History of Present Illness Megan Foster is a 89 year old female who presents with concerns about chronic lung changes seen on chest x-ray.  A recent chest x-ray revealed a spot in her lung, described as a granuloma, along with linear opacities and atelectasis. Previous x-rays in March and July 2024 also showed a similar spot. She recalls a similar finding when she was 19, which was deemed non-concerning at the time. She is uncertain if the current findings are new or chronic.  She has a history of asthma, which she describes as well-controlled. She uses a spirometer daily and takes Flonase , Mucinex, and Robitussin for respiratory symptoms. She also uses Spiriva and has an albuterol inhaler, though she has not used it recently. No asthma attacks in approximately five years. She has allergies, particularly to pollen, which exacerbate her symptoms. No recent bronchitis or asthma exacerbations, but she reports occasional wheezing.  She recently sustained a hip fracture and underwent hip replacement surgery. She is currently using a walker with assistance for mobility and has a Foley catheter due to  bladder issues post-surgery, with her bladder not functioning properly. There is a possibility of needing a suprapubic catheter.  [place] having lived in [place]  and [place] . 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 She currently lives alone after the passing of her husband and daughter, with her sister nearby. She does not smoke and has no pets.  Interim history: Hayla Kahlen Foster is a 89 year old female with pulmonary fibrosis who presents for evaluation of an abnormal chest x-ray. She was referred for evaluation of an abnormal chest x-ray.  Pulmonary fibrosis and interstitial lung disease - Pulmonary fibrosis with recent CT scan showing mild scarring at the base of the lung, consistent with interstitial lung disease - Abnormal chest x-ray prompted current evaluation - Granuloma in the right lung present for many years without change - No shortness of breath or major breathing problems  Esophageal dysphagia and gastrointestinal symptoms - History of esophageal narrowing - Prior episode requiring emergency removal of chicken noodles from the esophagus - Currently on a liquid diet due to difficulty eating solid foods - Diarrhea attributed to current diet - Takes medications one pill at a time due to esophageal narrowing  Bladder dysfunction and suprapubic catheter - Recent placement of suprapubic catheter for bladder dysfunction - Pain and difficulty sitting since catheter placement - Multiple prior urinary tract infections leading to recommendation for suprapubic catheter - Regret regarding the catheter procedure  Allergic rhinitis and nasal congestion - Uses Flonase  twice daily and Mucinex for nasal passage management - Nasal congestion described as nose getting 'messed up' - Uses Clindesse to keep sinus  airways open  Relevant pulmonary history Pets: No pets Occupation: Retired  Charity fundraiser, Production designer, theatre/television/film Exposures: No mold, hot tub, Financial controller.  No feather pillows or comforters No h/o chemo/XRT/amiodarone/macrodantin/MTX  No exposure to asbestos, silica or other organic allergens  Smoking history: Never smoker Travel history: Previously lived in [place] .  No significant recent travel Relevant family history: No family history of lung disease  Outpatient Encounter Medications as of 10/06/2023  Medication Sig   acetaminophen  (TYLENOL ) 500 MG tablet Take 1,000 mg by mouth in the morning and at bedtime.   atorvastatin  (LIPITOR) 10 MG tablet Take 10 mg by mouth daily.   atorvastatin  (LIPITOR) 20 MG tablet Take 20 mg by mouth at bedtime.   brimonidine (ALPHAGAN) 0.2 % ophthalmic solution Place 1 drop into both eyes 3 (three) times daily.   dorzolamide -timolol  (COSOPT ) 2-0.5 % ophthalmic solution Place 1 drop into the right eye 2 (two) times daily.   famotidine  (PEPCID ) 40 MG tablet Take 1 tablet (40 mg total) by mouth at bedtime.   fexofenadine (ALLEGRA) 180 MG tablet Take 180 mg by mouth every morning.   fluticasone  (FLONASE ) 50 MCG/ACT nasal spray Place 2 sprays into the nose daily.   HYDROcodone -acetaminophen  (NORCO/VICODIN) 5-325 MG tablet Take 1 tablet by mouth every 4 (four) hours as needed for moderate pain or severe pain.   ipratropium (ATROVENT ) 0.03 % nasal spray Place 2 sprays into both nostrils daily.   latanoprost  (XALATAN ) 0.005 % ophthalmic solution Place 1 drop into both eyes at bedtime.   levocetirizine (XYZAL) 5 MG tablet Take 5 mg by mouth every evening.   levothyroxine  (SYNTHROID , LEVOTHROID) 88 MCG tablet Take 88 mcg by mouth daily before breakfast. *BRAND NAME ONLY   metoprolol  succinate (TOPROL -XL) 50 MG 24 hr tablet TAKE ONE TABLET BY MOUTH ONE TIME DAILY (Patient taking differently: Take 50 mg by mouth daily.)   MICONAZOLE 3 200 MG vaginal suppository Place 200 mg vaginally at bedtime.   montelukast  (SINGULAIR ) 10 MG tablet Take 10 mg by mouth at bedtime.    Multiple Vitamins-Minerals (PRESERVISION AREDS 2 PO) Take 1 tablet by mouth 2 (two) times daily.    ondansetron  (ZOFRAN ) 4 MG tablet Take 4 mg by mouth every 8 (eight) hours as needed for nausea or vomiting.   pantoprazole  (PROTONIX ) 40 MG tablet TAKE 1 TABLET BY MOUTH 2 TIMES DAILY (Patient taking differently: Take 40 mg by mouth 2 (two) times daily.)   sodium chloride  (MURO 128) 2 % ophthalmic solution Place 1 drop into both eyes in the morning, at noon, in the evening, and at bedtime.   sodium chloride  (MURO 128) 5 % ophthalmic ointment Place 1 Application into both eyes at bedtime.   sucralfate  (CARAFATE ) 1 GM/10ML suspension Take 10 mLs (1 g total) by mouth 4 (four) times daily.   tamsulosin (FLOMAX) 0.4 MG CAPS capsule Take 0.8 mg by mouth daily.   triamcinolone (NASACORT ALLERGY 24HR) 55 MCG/ACT AERO nasal inhaler Place 2 sprays into the nose daily.   triamcinolone cream (KENALOG) 0.5 % Apply 1 Application topically daily as needed (for irritation on hands).   No facility-administered encounter medications on file as of 10/06/2023.    Physical Exam: Blood pressure (!) 124/58, pulse 69, height 5' 5 (1.651 m), weight 125 lb (56.7 kg), SpO2 98%. Gen:      No acute distress HEENT:  EOMI, sclera anicteric Neck:     No masses; no thyromegaly Lungs:    Clear to auscultation bilaterally; normal respiratory effort CV:  Regular rate and rhythm; no murmurs Abd:      + bowel sounds; soft, non-tender; no palpable masses, no distension Ext:    No edema; adequate peripheral perfusion Skin:      Warm and dry; no rash Neuro: alert and oriented x 3 Psych: normal mood and affect  Data Reviewed: Imaging: Chest x-ray report from primary care 06/11/2023 Compared to previous x-rays on 08/26/2022 and 05/05/2023 Small right lung granuloma, mild bibasal atelectasis, chronic linear prominence with no acute change.  High-resolution CT 06/17/2023-mild basilar reticulation, ground glass and traction  bronchiectasis and probable UIP pattern, air trapping. I have reviewed the images personally.  PFTs:  Labs: Report from primary care CMP 06/16/2023-normal hepatic panel, BUN/creatinine 16/0.78 CMP 05/16/2023-WBC 5.2, hemoglobin 11.1, platelets 181  CTD serologies ESR 07/2023-ANA 1: 40, nuclear pattern  Assessment & Plan Pulmonary fibrosis and stable right lung granuloma Mild pulmonary fibrosis identified on CT scan, likely present for several years, with no significant symptoms such as dyspnea. Right lung granuloma has been present for many years without growth or change. Differential diagnosis includes age-related changes, prior infection, or exposure. ANA lab is borderline abnormal but not indicative of autoimmune disease. Risks of treatment outweigh benefits due to age and comorbidities. Treatment options for pulmonary fibrosis have significant side effects such as diarrhea and gastrointestinal upset, which are not advisable given her age and current health status. - Monitor for symptoms such as increased dyspnea. - Avoid invasive procedures like lung biopsy due to age and mild nature of condition. - Follow up as needed if symptoms change.  Asthma, unspecified Asthma is well-controlled with no attacks in the past five years. She uses Singulair , with albuterol available as a rescue inhaler. Used spiriva in the past. Symptoms of wheezing and cough are likely exacerbated by allergies and pollen exposure. - Continue current asthma management with Singulair . - Use albuterol inhaler as needed for asthma symptoms. - Consider over-the-counter antihistamines like Zyrtec or Claritin  for allergy symptoms.  Fractured hip Urinary retention, neurogenic bladder Status post hip replacement and leg repair. She is currently unable to walk independently and uses a walker with assistance.  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 Management includes eye drops and medications.  Goals of Care She prefers conservative management and is not interested in aggressive or heroic interventions. She is open to treatments that are easily manageable and do not cause significant side effects, emphasizing quality of life over life-extending measures.  Recommendations: Follow-up as needed  Dustan Hyams MD [facility] 10/06/2023, 2:39 PM  CC: Loreli Elsie JONETTA Mickey., MD

## 2023-10-06 NOTE — Patient Instructions (Addendum)
  VISIT SUMMARY: Today, we discussed your recent abnormal chest x-ray and evaluated your ongoing health concerns, including pulmonary fibrosis, esophageal dysphagia, bladder dysfunction, and nasal congestion. We reviewed your current symptoms and treatment plans to ensure your comfort and well-being.  YOUR PLAN: -PULMONARY FIBROSIS AND STABLE RIGHT LUNG GRANULOMA: Pulmonary fibrosis is a condition where lung tissue becomes scarred over time. Your recent CT scan shows mild scarring at the base of your lung, which is consistent with this condition. The granuloma in your right lung has been stable for many years. Given your age and the mild nature of your condition, we will avoid invasive procedures and monitor for any new symptoms like increased shortness of breath. If your symptoms change, please follow up as needed.  INSTRUCTIONS: Please monitor for any new symptoms such as increased shortness of breath and follow up if there are any changes. Avoid invasive procedures like lung biopsy due to the mild nature of your condition and your age.

## 2023-10-07 ENCOUNTER — Other Ambulatory Visit (HOSPITAL_COMMUNITY)

## 2023-10-07 ENCOUNTER — Ambulatory Visit (HOSPITAL_COMMUNITY)
Admission: RE | Admit: 2023-10-07 | Discharge: 2023-10-07 | Disposition: A | Discharge status: Home / Self Care | Source: Ambulatory Visit | Attending: Physician Assistant | Admitting: Physician Assistant

## 2023-10-07 DIAGNOSIS — R1084 Generalized abdominal pain: Secondary | ICD-10-CM | POA: Insufficient documentation

## 2023-10-07 DIAGNOSIS — R109 Unspecified abdominal pain: Secondary | ICD-10-CM | POA: Diagnosis not present

## 2023-10-07 DIAGNOSIS — R634 Abnormal weight loss: Secondary | ICD-10-CM | POA: Insufficient documentation

## 2023-10-07 DIAGNOSIS — S72001D Fracture of unspecified part of neck of right femur, subsequent encounter for closed fracture with routine healing: Secondary | ICD-10-CM | POA: Diagnosis not present

## 2023-10-07 DIAGNOSIS — R1310 Dysphagia, unspecified: Secondary | ICD-10-CM | POA: Diagnosis not present

## 2023-10-07 DIAGNOSIS — R1314 Dysphagia, pharyngoesophageal phase: Secondary | ICD-10-CM | POA: Diagnosis not present

## 2023-10-07 DIAGNOSIS — K8020 Calculus of gallbladder without cholecystitis without obstruction: Secondary | ICD-10-CM | POA: Diagnosis not present

## 2023-10-07 DIAGNOSIS — D649 Anemia, unspecified: Secondary | ICD-10-CM | POA: Diagnosis not present

## 2023-10-07 DIAGNOSIS — N319 Neuromuscular dysfunction of bladder, unspecified: Secondary | ICD-10-CM | POA: Diagnosis not present

## 2023-10-07 DIAGNOSIS — K5730 Diverticulosis of large intestine without perforation or abscess without bleeding: Secondary | ICD-10-CM | POA: Diagnosis not present

## 2023-10-07 DIAGNOSIS — I10 Essential (primary) hypertension: Secondary | ICD-10-CM | POA: Diagnosis not present

## 2023-10-07 DIAGNOSIS — R41841 Cognitive communication deficit: Secondary | ICD-10-CM | POA: Diagnosis not present

## 2023-10-07 MED ORDER — IOHEXOL 300 MG/ML  SOLN
100.0000 mL | Freq: Once | INTRAMUSCULAR | Status: AC | PRN
  Administered 2023-10-07: 100 mL via INTRAVENOUS

## 2023-10-07 MED ORDER — SODIUM CHLORIDE (PF) 0.9 % IJ SOLN
INTRAMUSCULAR | Status: AC
  Filled 2023-10-07: qty 50

## 2023-10-07 MED ORDER — IOHEXOL 9 MG/ML PO SOLN
500.0000 mL | ORAL | Status: AC
  Administered 2023-10-07 (×2): 500 mL via ORAL

## 2023-10-07 MED ORDER — IOHEXOL 9 MG/ML PO SOLN
ORAL | Status: AC
  Filled 2023-10-07: qty 1000

## 2023-10-08 DIAGNOSIS — Z961 Presence of intraocular lens: Secondary | ICD-10-CM | POA: Diagnosis not present

## 2023-10-08 DIAGNOSIS — H401411 Capsular glaucoma with pseudoexfoliation of lens, right eye, mild stage: Secondary | ICD-10-CM | POA: Diagnosis not present

## 2023-10-08 DIAGNOSIS — H1045 Other chronic allergic conjunctivitis: Secondary | ICD-10-CM | POA: Diagnosis not present

## 2023-10-08 DIAGNOSIS — N319 Neuromuscular dysfunction of bladder, unspecified: Secondary | ICD-10-CM | POA: Diagnosis not present

## 2023-10-08 DIAGNOSIS — R1314 Dysphagia, pharyngoesophageal phase: Secondary | ICD-10-CM | POA: Diagnosis not present

## 2023-10-08 DIAGNOSIS — S72001D Fracture of unspecified part of neck of right femur, subsequent encounter for closed fracture with routine healing: Secondary | ICD-10-CM | POA: Diagnosis not present

## 2023-10-08 DIAGNOSIS — H40012 Open angle with borderline findings, low risk, left eye: Secondary | ICD-10-CM | POA: Diagnosis not present

## 2023-10-08 DIAGNOSIS — H18593 Other hereditary corneal dystrophies, bilateral: Secondary | ICD-10-CM | POA: Diagnosis not present

## 2023-10-08 DIAGNOSIS — H353122 Nonexudative age-related macular degeneration, left eye, intermediate dry stage: Secondary | ICD-10-CM | POA: Diagnosis not present

## 2023-10-08 DIAGNOSIS — R41841 Cognitive communication deficit: Secondary | ICD-10-CM | POA: Diagnosis not present

## 2023-10-09 DIAGNOSIS — R1314 Dysphagia, pharyngoesophageal phase: Secondary | ICD-10-CM | POA: Diagnosis not present

## 2023-10-09 DIAGNOSIS — R41841 Cognitive communication deficit: Secondary | ICD-10-CM | POA: Diagnosis not present

## 2023-10-09 DIAGNOSIS — S72001D Fracture of unspecified part of neck of right femur, subsequent encounter for closed fracture with routine healing: Secondary | ICD-10-CM | POA: Diagnosis not present

## 2023-10-09 DIAGNOSIS — N319 Neuromuscular dysfunction of bladder, unspecified: Secondary | ICD-10-CM | POA: Diagnosis not present

## 2023-10-10 ENCOUNTER — Ambulatory Visit: Payer: Self-pay | Admitting: Physician Assistant

## 2023-10-10 DIAGNOSIS — R41841 Cognitive communication deficit: Secondary | ICD-10-CM | POA: Diagnosis not present

## 2023-10-10 DIAGNOSIS — N319 Neuromuscular dysfunction of bladder, unspecified: Secondary | ICD-10-CM | POA: Diagnosis not present

## 2023-10-10 DIAGNOSIS — S72001D Fracture of unspecified part of neck of right femur, subsequent encounter for closed fracture with routine healing: Secondary | ICD-10-CM | POA: Diagnosis not present

## 2023-10-10 DIAGNOSIS — R1314 Dysphagia, pharyngoesophageal phase: Secondary | ICD-10-CM | POA: Diagnosis not present

## 2023-10-13 DIAGNOSIS — R41841 Cognitive communication deficit: Secondary | ICD-10-CM | POA: Diagnosis not present

## 2023-10-13 DIAGNOSIS — R1314 Dysphagia, pharyngoesophageal phase: Secondary | ICD-10-CM | POA: Diagnosis not present

## 2023-10-17 DIAGNOSIS — R102 Pelvic and perineal pain: Secondary | ICD-10-CM | POA: Diagnosis not present

## 2023-10-17 DIAGNOSIS — I10 Essential (primary) hypertension: Secondary | ICD-10-CM | POA: Diagnosis not present

## 2023-10-17 DIAGNOSIS — J309 Allergic rhinitis, unspecified: Secondary | ICD-10-CM | POA: Diagnosis not present

## 2023-10-17 DIAGNOSIS — R197 Diarrhea, unspecified: Secondary | ICD-10-CM | POA: Diagnosis not present

## 2023-10-18 DIAGNOSIS — N390 Urinary tract infection, site not specified: Secondary | ICD-10-CM | POA: Diagnosis not present

## 2023-10-19 DIAGNOSIS — R8290 Unspecified abnormal findings in urine: Secondary | ICD-10-CM | POA: Diagnosis not present

## 2023-10-19 DIAGNOSIS — N390 Urinary tract infection, site not specified: Secondary | ICD-10-CM | POA: Diagnosis not present

## 2023-10-19 DIAGNOSIS — R109 Unspecified abdominal pain: Secondary | ICD-10-CM | POA: Diagnosis not present

## 2023-10-19 DIAGNOSIS — N319 Neuromuscular dysfunction of bladder, unspecified: Secondary | ICD-10-CM | POA: Diagnosis not present

## 2023-10-26 DIAGNOSIS — R059 Cough, unspecified: Secondary | ICD-10-CM | POA: Diagnosis not present

## 2023-10-30 DIAGNOSIS — I10 Essential (primary) hypertension: Secondary | ICD-10-CM | POA: Diagnosis not present

## 2023-10-30 DIAGNOSIS — R2681 Unsteadiness on feet: Secondary | ICD-10-CM | POA: Diagnosis not present

## 2023-10-30 DIAGNOSIS — N390 Urinary tract infection, site not specified: Secondary | ICD-10-CM | POA: Diagnosis not present

## 2023-10-30 DIAGNOSIS — M6281 Muscle weakness (generalized): Secondary | ICD-10-CM | POA: Diagnosis not present

## 2023-10-30 DIAGNOSIS — N319 Neuromuscular dysfunction of bladder, unspecified: Secondary | ICD-10-CM | POA: Diagnosis not present

## 2023-10-31 DIAGNOSIS — M6281 Muscle weakness (generalized): Secondary | ICD-10-CM | POA: Diagnosis not present

## 2023-10-31 DIAGNOSIS — R2681 Unsteadiness on feet: Secondary | ICD-10-CM | POA: Diagnosis not present

## 2023-11-03 DIAGNOSIS — R2681 Unsteadiness on feet: Secondary | ICD-10-CM | POA: Diagnosis not present

## 2023-11-03 DIAGNOSIS — M6281 Muscle weakness (generalized): Secondary | ICD-10-CM | POA: Diagnosis not present

## 2023-11-04 DIAGNOSIS — M6281 Muscle weakness (generalized): Secondary | ICD-10-CM | POA: Diagnosis not present

## 2023-11-04 DIAGNOSIS — R2681 Unsteadiness on feet: Secondary | ICD-10-CM | POA: Diagnosis not present

## 2023-11-05 DIAGNOSIS — M6281 Muscle weakness (generalized): Secondary | ICD-10-CM | POA: Diagnosis not present

## 2023-11-05 DIAGNOSIS — R2681 Unsteadiness on feet: Secondary | ICD-10-CM | POA: Diagnosis not present

## 2023-11-05 DIAGNOSIS — D649 Anemia, unspecified: Secondary | ICD-10-CM | POA: Diagnosis not present

## 2023-11-05 DIAGNOSIS — I10 Essential (primary) hypertension: Secondary | ICD-10-CM | POA: Diagnosis not present

## 2023-11-06 DIAGNOSIS — R2681 Unsteadiness on feet: Secondary | ICD-10-CM | POA: Diagnosis not present

## 2023-11-06 DIAGNOSIS — M6281 Muscle weakness (generalized): Secondary | ICD-10-CM | POA: Diagnosis not present

## 2023-11-07 DIAGNOSIS — M6281 Muscle weakness (generalized): Secondary | ICD-10-CM | POA: Diagnosis not present

## 2023-11-07 DIAGNOSIS — L7682 Other postprocedural complications of skin and subcutaneous tissue: Secondary | ICD-10-CM | POA: Diagnosis not present

## 2023-11-07 DIAGNOSIS — R8290 Unspecified abnormal findings in urine: Secondary | ICD-10-CM | POA: Diagnosis not present

## 2023-11-07 DIAGNOSIS — R2681 Unsteadiness on feet: Secondary | ICD-10-CM | POA: Diagnosis not present

## 2023-11-07 DIAGNOSIS — R102 Pelvic and perineal pain: Secondary | ICD-10-CM | POA: Diagnosis not present

## 2023-11-07 DIAGNOSIS — R112 Nausea with vomiting, unspecified: Secondary | ICD-10-CM | POA: Diagnosis not present

## 2023-11-08 DIAGNOSIS — N390 Urinary tract infection, site not specified: Secondary | ICD-10-CM | POA: Diagnosis not present

## 2023-11-08 DIAGNOSIS — T8140XA Infection following a procedure, unspecified, initial encounter: Secondary | ICD-10-CM | POA: Diagnosis not present

## 2023-11-10 DIAGNOSIS — R2681 Unsteadiness on feet: Secondary | ICD-10-CM | POA: Diagnosis not present

## 2023-11-10 DIAGNOSIS — M6281 Muscle weakness (generalized): Secondary | ICD-10-CM | POA: Diagnosis not present

## 2023-11-10 DIAGNOSIS — R338 Other retention of urine: Secondary | ICD-10-CM | POA: Diagnosis not present

## 2023-11-11 DIAGNOSIS — M6281 Muscle weakness (generalized): Secondary | ICD-10-CM | POA: Diagnosis not present

## 2023-11-11 DIAGNOSIS — N390 Urinary tract infection, site not specified: Secondary | ICD-10-CM | POA: Diagnosis not present

## 2023-11-11 DIAGNOSIS — R2681 Unsteadiness on feet: Secondary | ICD-10-CM | POA: Diagnosis not present

## 2023-11-12 DIAGNOSIS — M6281 Muscle weakness (generalized): Secondary | ICD-10-CM | POA: Diagnosis not present

## 2023-11-12 DIAGNOSIS — R2681 Unsteadiness on feet: Secondary | ICD-10-CM | POA: Diagnosis not present

## 2023-11-13 DIAGNOSIS — Z23 Encounter for immunization: Secondary | ICD-10-CM | POA: Diagnosis not present

## 2023-11-13 DIAGNOSIS — R2681 Unsteadiness on feet: Secondary | ICD-10-CM | POA: Diagnosis not present

## 2023-11-13 DIAGNOSIS — M6281 Muscle weakness (generalized): Secondary | ICD-10-CM | POA: Diagnosis not present

## 2023-11-14 DIAGNOSIS — E032 Hypothyroidism due to medicaments and other exogenous substances: Secondary | ICD-10-CM | POA: Diagnosis not present

## 2023-11-14 DIAGNOSIS — M6281 Muscle weakness (generalized): Secondary | ICD-10-CM | POA: Diagnosis not present

## 2023-11-14 DIAGNOSIS — R2681 Unsteadiness on feet: Secondary | ICD-10-CM | POA: Diagnosis not present

## 2023-11-14 DIAGNOSIS — I10 Essential (primary) hypertension: Secondary | ICD-10-CM | POA: Diagnosis not present

## 2023-11-14 DIAGNOSIS — N183 Chronic kidney disease, stage 3 unspecified: Secondary | ICD-10-CM | POA: Diagnosis not present

## 2023-11-14 DIAGNOSIS — E785 Hyperlipidemia, unspecified: Secondary | ICD-10-CM | POA: Diagnosis not present

## 2023-11-14 DIAGNOSIS — M810 Age-related osteoporosis without current pathological fracture: Secondary | ICD-10-CM | POA: Diagnosis not present

## 2023-11-14 DIAGNOSIS — E119 Type 2 diabetes mellitus without complications: Secondary | ICD-10-CM | POA: Diagnosis not present

## 2023-11-15 DIAGNOSIS — E559 Vitamin D deficiency, unspecified: Secondary | ICD-10-CM | POA: Diagnosis not present

## 2023-11-15 DIAGNOSIS — E785 Hyperlipidemia, unspecified: Secondary | ICD-10-CM | POA: Diagnosis not present

## 2023-11-17 DIAGNOSIS — R2681 Unsteadiness on feet: Secondary | ICD-10-CM | POA: Diagnosis not present

## 2023-11-17 DIAGNOSIS — M6281 Muscle weakness (generalized): Secondary | ICD-10-CM | POA: Diagnosis not present

## 2023-11-18 DIAGNOSIS — R2681 Unsteadiness on feet: Secondary | ICD-10-CM | POA: Diagnosis not present

## 2023-11-18 DIAGNOSIS — M6281 Muscle weakness (generalized): Secondary | ICD-10-CM | POA: Diagnosis not present

## 2023-11-19 DIAGNOSIS — M6281 Muscle weakness (generalized): Secondary | ICD-10-CM | POA: Diagnosis not present

## 2023-11-19 DIAGNOSIS — R2681 Unsteadiness on feet: Secondary | ICD-10-CM | POA: Diagnosis not present

## 2023-11-20 DIAGNOSIS — R2681 Unsteadiness on feet: Secondary | ICD-10-CM | POA: Diagnosis not present

## 2023-11-20 DIAGNOSIS — M6281 Muscle weakness (generalized): Secondary | ICD-10-CM | POA: Diagnosis not present

## 2023-11-21 DIAGNOSIS — M6281 Muscle weakness (generalized): Secondary | ICD-10-CM | POA: Diagnosis not present

## 2023-11-21 DIAGNOSIS — R2681 Unsteadiness on feet: Secondary | ICD-10-CM | POA: Diagnosis not present

## 2023-11-24 DIAGNOSIS — R2681 Unsteadiness on feet: Secondary | ICD-10-CM | POA: Diagnosis not present

## 2023-11-24 DIAGNOSIS — M6281 Muscle weakness (generalized): Secondary | ICD-10-CM | POA: Diagnosis not present

## 2023-11-25 DIAGNOSIS — R2681 Unsteadiness on feet: Secondary | ICD-10-CM | POA: Diagnosis not present

## 2023-11-25 DIAGNOSIS — M6281 Muscle weakness (generalized): Secondary | ICD-10-CM | POA: Diagnosis not present

## 2023-11-26 ENCOUNTER — Telehealth: Payer: Self-pay | Admitting: Gastroenterology

## 2023-11-26 DIAGNOSIS — M6281 Muscle weakness (generalized): Secondary | ICD-10-CM | POA: Diagnosis not present

## 2023-11-26 DIAGNOSIS — R2681 Unsteadiness on feet: Secondary | ICD-10-CM | POA: Diagnosis not present

## 2023-11-26 NOTE — Telephone Encounter (Addendum)
 Procedure:Endoscopy Procedure date: 12/04/23 Procedure location: [facility] Arrival Time: 6:00 am Spoke with the patient Y/N: No, spoke with pt's sister Any prep concerns? No  Has the patient obtained the prep from the pharmacy ? No prep needed Do you have a care partner and transportation: Yes Any additional concerns? No

## 2023-11-27 DIAGNOSIS — R2681 Unsteadiness on feet: Secondary | ICD-10-CM | POA: Diagnosis not present

## 2023-11-27 DIAGNOSIS — M6281 Muscle weakness (generalized): Secondary | ICD-10-CM | POA: Diagnosis not present

## 2023-11-28 DIAGNOSIS — R2681 Unsteadiness on feet: Secondary | ICD-10-CM | POA: Diagnosis not present

## 2023-11-28 DIAGNOSIS — M6281 Muscle weakness (generalized): Secondary | ICD-10-CM | POA: Diagnosis not present

## 2023-12-01 DIAGNOSIS — M6281 Muscle weakness (generalized): Secondary | ICD-10-CM | POA: Diagnosis not present

## 2023-12-01 DIAGNOSIS — R2681 Unsteadiness on feet: Secondary | ICD-10-CM | POA: Diagnosis not present

## 2023-12-02 DIAGNOSIS — M6281 Muscle weakness (generalized): Secondary | ICD-10-CM | POA: Diagnosis not present

## 2023-12-02 DIAGNOSIS — R2681 Unsteadiness on feet: Secondary | ICD-10-CM | POA: Diagnosis not present

## 2023-12-03 ENCOUNTER — Encounter (HOSPITAL_COMMUNITY): Payer: Self-pay | Admitting: Gastroenterology

## 2023-12-03 DIAGNOSIS — R2681 Unsteadiness on feet: Secondary | ICD-10-CM | POA: Diagnosis not present

## 2023-12-03 DIAGNOSIS — M6281 Muscle weakness (generalized): Secondary | ICD-10-CM | POA: Diagnosis not present

## 2023-12-03 NOTE — Anesthesia Preprocedure Evaluation (Signed)
 Anesthesia Evaluation  Patient identified by MRN, date of birth, ID band Patient awake    Reviewed: Allergy & Precautions, NPO status , Patient's Chart, lab work & pertinent test results  Airway Mallampati: II  TM Distance: >3 FB     Dental  (+) Edentulous Upper, Edentulous Lower   Pulmonary asthma , COPD,  COPD inhaler   breath sounds clear to auscultation + decreased breath sounds      Cardiovascular hypertension, + Peripheral Vascular Disease  Normal cardiovascular exam Rhythm:Regular Rate:Normal  Hx/o chronic mesenteric ischemia   Neuro/Psych  Headaches PSYCHIATRIC DISORDERS  Depression    Claucoma Macular degeneration    GI/Hepatic Neg liver ROS, hiatal hernia,GERD  Medicated,,Food impaction Dysphagia Hx/o Schatzki's ring Hx/o esophaeal stricture Hx/o esophageal dysmotility   Endo/Other  Hypothyroidism  Hyperlipidemia  Renal/GU negative Renal ROS     Musculoskeletal  (+) Arthritis , Osteoarthritis,  Osteoporosis   Abdominal   Peds  Hematology negative hematology ROS (+)   Anesthesia Other Findings   Reproductive/Obstetrics                              Anesthesia Physical Anesthesia Plan  ASA: 3  Anesthesia Plan: MAC   Post-op Pain Management: Minimal or no pain anticipated   Induction: Intravenous  PONV Risk Score and Plan: 4 or greater and Treatment may vary due to age or medical condition and Propofol  infusion  Airway Management Planned: Natural Airway, Nasal Cannula and Simple Face Mask  Additional Equipment: None  Intra-op Plan:   Post-operative Plan:   Informed Consent: I have reviewed the patients History and Physical, chart, labs and discussed the procedure including the risks, benefits and alternatives for the proposed anesthesia with the patient or authorized representative who has indicated his/her understanding and acceptance.     Dental advisory  given  Plan Discussed with: CRNA and Anesthesiologist  Anesthesia Plan Comments: (May need GETA if food bolus detected)         Anesthesia Quick Evaluation

## 2023-12-04 ENCOUNTER — Ambulatory Visit (HOSPITAL_COMMUNITY): Payer: Self-pay | Admitting: Anesthesiology

## 2023-12-04 ENCOUNTER — Encounter (HOSPITAL_COMMUNITY): Payer: Self-pay | Admitting: Gastroenterology

## 2023-12-04 ENCOUNTER — Other Ambulatory Visit: Payer: Self-pay

## 2023-12-04 ENCOUNTER — Ambulatory Visit (HOSPITAL_BASED_OUTPATIENT_CLINIC_OR_DEPARTMENT_OTHER): Payer: Self-pay | Admitting: Anesthesiology

## 2023-12-04 ENCOUNTER — Encounter (HOSPITAL_COMMUNITY)
Admission: RE | Disposition: A | Discharge status: Home / Self Care | Payer: Self-pay | Source: Home / Self Care | Attending: Gastroenterology

## 2023-12-04 ENCOUNTER — Ambulatory Visit (HOSPITAL_COMMUNITY)
Admission: RE | Admit: 2023-12-04 | Discharge: 2023-12-04 | Disposition: A | Discharge status: Home / Self Care | Attending: Gastroenterology | Admitting: Gastroenterology

## 2023-12-04 DIAGNOSIS — F32A Depression, unspecified: Secondary | ICD-10-CM | POA: Insufficient documentation

## 2023-12-04 DIAGNOSIS — K317 Polyp of stomach and duodenum: Secondary | ICD-10-CM | POA: Diagnosis not present

## 2023-12-04 DIAGNOSIS — K2970 Gastritis, unspecified, without bleeding: Secondary | ICD-10-CM | POA: Diagnosis not present

## 2023-12-04 DIAGNOSIS — R1084 Generalized abdominal pain: Secondary | ICD-10-CM | POA: Diagnosis not present

## 2023-12-04 DIAGNOSIS — Z7951 Long term (current) use of inhaled steroids: Secondary | ICD-10-CM | POA: Insufficient documentation

## 2023-12-04 DIAGNOSIS — W44F3XA Food entering into or through a natural orifice, initial encounter: Secondary | ICD-10-CM | POA: Diagnosis not present

## 2023-12-04 DIAGNOSIS — R1310 Dysphagia, unspecified: Secondary | ICD-10-CM

## 2023-12-04 DIAGNOSIS — K222 Esophageal obstruction: Secondary | ICD-10-CM | POA: Diagnosis not present

## 2023-12-04 DIAGNOSIS — N319 Neuromuscular dysfunction of bladder, unspecified: Secondary | ICD-10-CM | POA: Diagnosis not present

## 2023-12-04 DIAGNOSIS — I739 Peripheral vascular disease, unspecified: Secondary | ICD-10-CM | POA: Insufficient documentation

## 2023-12-04 DIAGNOSIS — I10 Essential (primary) hypertension: Secondary | ICD-10-CM

## 2023-12-04 DIAGNOSIS — R634 Abnormal weight loss: Secondary | ICD-10-CM

## 2023-12-04 DIAGNOSIS — K219 Gastro-esophageal reflux disease without esophagitis: Secondary | ICD-10-CM | POA: Insufficient documentation

## 2023-12-04 DIAGNOSIS — E039 Hypothyroidism, unspecified: Secondary | ICD-10-CM

## 2023-12-04 DIAGNOSIS — K551 Chronic vascular disorders of intestine: Secondary | ICD-10-CM

## 2023-12-04 DIAGNOSIS — T18128A Food in esophagus causing other injury, initial encounter: Secondary | ICD-10-CM | POA: Insufficient documentation

## 2023-12-04 DIAGNOSIS — K449 Diaphragmatic hernia without obstruction or gangrene: Secondary | ICD-10-CM | POA: Diagnosis not present

## 2023-12-04 DIAGNOSIS — E785 Hyperlipidemia, unspecified: Secondary | ICD-10-CM | POA: Diagnosis not present

## 2023-12-04 DIAGNOSIS — Z79890 Hormone replacement therapy (postmenopausal): Secondary | ICD-10-CM | POA: Insufficient documentation

## 2023-12-04 DIAGNOSIS — R1319 Other dysphagia: Secondary | ICD-10-CM

## 2023-12-04 DIAGNOSIS — J4489 Other specified chronic obstructive pulmonary disease: Secondary | ICD-10-CM | POA: Insufficient documentation

## 2023-12-04 DIAGNOSIS — J449 Chronic obstructive pulmonary disease, unspecified: Secondary | ICD-10-CM | POA: Diagnosis not present

## 2023-12-04 DIAGNOSIS — M810 Age-related osteoporosis without current pathological fracture: Secondary | ICD-10-CM | POA: Insufficient documentation

## 2023-12-04 DIAGNOSIS — D131 Benign neoplasm of stomach: Secondary | ICD-10-CM

## 2023-12-04 HISTORY — PX: ESOPHAGOGASTRODUODENOSCOPY: SHX5428

## 2023-12-04 SURGERY — EGD (ESOPHAGOGASTRODUODENOSCOPY)
Anesthesia: Monitor Anesthesia Care

## 2023-12-04 MED ORDER — PHENYLEPHRINE 80 MCG/ML (10ML) SYRINGE FOR IV PUSH (FOR BLOOD PRESSURE SUPPORT)
PREFILLED_SYRINGE | INTRAVENOUS | Status: DC | PRN
Start: 2023-12-04 — End: 2023-12-04
  Administered 2023-12-04: 80 ug via INTRAVENOUS
  Administered 2023-12-04: 160 ug via INTRAVENOUS
  Administered 2023-12-04: 80 ug via INTRAVENOUS

## 2023-12-04 MED ORDER — PROPOFOL 500 MG/50ML IV EMUL
INTRAVENOUS | Status: DC | PRN
  Administered 2023-12-04: 35 ug/kg/min via INTRAVENOUS

## 2023-12-04 MED ORDER — SODIUM CHLORIDE 0.9 % IV SOLN: INTRAVENOUS | Status: DC

## 2023-12-04 MED ORDER — PROPOFOL 10 MG/ML IV BOLUS
INTRAVENOUS | Status: DC | PRN
Start: 2023-12-04 — End: 2023-12-04
  Administered 2023-12-04: 30 mg via INTRAVENOUS
  Administered 2023-12-04: 10 mg via INTRAVENOUS
  Administered 2023-12-04 (×2): 20 mg via INTRAVENOUS
  Administered 2023-12-04: 40 mg via INTRAVENOUS

## 2023-12-04 MED ORDER — LIDOCAINE 2% (20 MG/ML) 5 ML SYRINGE
INTRAMUSCULAR | Status: DC | PRN
  Administered 2023-12-04: 100 mg via INTRAVENOUS

## 2023-12-04 MED ORDER — FENTANYL CITRATE (PF) 100 MCG/2ML IJ SOLN: 25.0000 ug | INTRAMUSCULAR | Status: DC | PRN

## 2023-12-04 NOTE — Op Note (Signed)
 [facility] Patient Name: Megan Foster Procedure Date : 12/04/2023 MRN: [id number] Attending MD: Sandor Flatter , MD, [id number] Date of Birth: 08/19/1927 CSN: [id number] Age: 89 Admit Type: Outpatient Procedure:                Upper GI endoscopy Indications:              Dysphagia, Abnormal cine-esophagram, Dyspepsia Providers:                Sandor Flatter, MD, Ozell Pouch, Corky Czech, Technician Referring MD:              Medicines:                Monitored Anesthesia Care Complications:            No immediate complications. Estimated Blood Loss:     Estimated blood loss was minimal. Procedure:                Pre-Anesthesia Assessment:                           - Prior to the procedure, a History and Physical                            was performed, and patient medications and                            allergies were reviewed. The patient's tolerance of                            previous anesthesia was also reviewed. The risks                            and benefits of the procedure and the sedation                            options and risks were discussed with the patient.                            All questions were answered, and informed consent                            was obtained. Prior Anticoagulants: The patient has                            taken no anticoagulant or antiplatelet agents. ASA                            Grade Assessment: III - A patient with severe                            systemic disease. After reviewing the risks and  benefits, the patient was deemed in satisfactory                            condition to undergo the procedure.                           After obtaining informed consent, the endoscope was                            passed under direct vision. Throughout the                            procedure, the patient's blood pressure, pulse, and                             oxygen  saturations were monitored continuously. The                            [id number]-H190 ([id number]) Olympus endoscope was introduced                            through the mouth, and advanced to the second part                            of duodenum. The upper GI endoscopy was                            accomplished without difficulty. The patient                            tolerated the procedure well. Scope In: Scope Out: Findings:      One benign-appearing, intrinsic moderate stenosis was found 31 cm from       the incisors. This stenosis measured less than one cm (in length). The       stenosis was traversed. A TTS dilator was passed through the scope.       Dilation with a 12-13.5-15 mm balloon dilator was performed to 15 mm.       The dilation site was examined and showed mild mucosal disruption and       moderate improvement in luminal narrowing. The ring was then further       fractured using cold forceps (not sent for pathology). Estimated blood       loss was minimal.      A 2 cm hiatal hernia was present.      Scattered mild inflammation characterized by erythema was found in the       gastric body. Biopsies were taken with a cold forceps for histology.       Estimated blood loss was minimal.      Multiple small sessile polyps with no bleeding and no stigmata of recent       bleeding were found in the gastric fundus and in the gastric body.      The examined duodenum was normal. Impression:               - Benign-appearing esophageal stenosis. Dilated  with 15 mm TTS balloon with small, appropriate                            mucosal disruption consistent with successful                            dilation. This was then further fractured with                            forceps.                           - 2 cm hiatal hernia.                           - Multiple gastric polyps. Resected and retrieved.                           - Normal examined  duodenum. Recommendation:           - Patient has a contact number available for                            emergencies. The signs and symptoms of potential                            delayed complications were discussed with the                            patient. Return to normal activities tomorrow.                            Written discharge instructions were provided to the                            patient.                           - Soft diet today then slowly advance as tolerated                            tomorrow.                           - Continue present medications.                           - Await pathology results.                           - Repeat upper endoscopy PRN for retreatment.                           - Return to GI clinic in 3 months. Procedure Code(s):        --- Professional ---  [phone], Esophagogastroduodenoscopy, flexible,                            transoral; with transendoscopic balloon dilation of                            esophagus (less than 30 mm diameter)                           [id number], 59, Esophagogastroduodenoscopy, flexible,                            transoral; with biopsy, single or multiple Diagnosis Code(s):        --- Professional ---                           K22.2, Esophageal obstruction                           K44.9, Diaphragmatic hernia without obstruction or                            gangrene                           K31.7, Polyp of stomach and duodenum                           R13.10, Dysphagia, unspecified                           R93.3, Abnormal findings on diagnostic imaging of                            other parts of digestive tract CPT copyright 2022 [employer]. All rights reserved. The codes documented in this report are preliminary and upon coder review may  be revised to meet current compliance requirements. Sandor Flatter, MD 12/04/2023 8:26:13 AM Number of Addenda:  0

## 2023-12-04 NOTE — H&P (Signed)
 GASTROENTEROLOGY PROCEDURE H&P NOTE   Primary Care Physician: Albina GORMAN Dine, MD    Reason for Procedure:   Dysphagia, esophageal stenosis  Plan:    EGD   Patient is appropriate for endoscopic procedure(s) at [facility].  The nature of the procedure, as well as the risks, benefits, and alternatives were carefully and thoroughly reviewed with the patient. Ample time for discussion and questions allowed. The patient understood, was satisfied, and agreed to proceed.     HPI: Megan Foster is a 89 y.o. female who presents for EGD with dilation for recurrent dysphagia and esophageal narrowing. Hx of food impaction in 07/2023 and returns today for re-evaluation and esophageal dilation as appropriate.    Past Medical History:  Diagnosis Date   Allergy    environmental per pt   Arthritis    Asthma    Dementia (HCC)    Depression    Elevated LFTs    Esophageal dysmotility    GERD (gastroesophageal reflux disease)    Glaucoma    Headache(784.0)    Hiatal hernia    Hyperplastic polyps of stomach    Hypertension    Hypothyroid    Irritable bowel syndrome    Macular degeneration    Nausea & vomiting 04/02/2012   Osteoporosis    Shingles    Sinusitis    Tachycardia    Vitamin D  deficiency     Past Surgical History:  Procedure Laterality Date   AORTOGRAM  04/16/12   COLONOSCOPY  2008   normal   ESOPHAGOGASTRODUODENOSCOPY  2014   multiple    ESOPHAGOGASTRODUODENOSCOPY Left 01/30/2019   Procedure: ESOPHAGOGASTRODUODENOSCOPY (EGD);  Surgeon: San Sandor GAILS, DO;  Location: [facility];  Service: Gastroenterology;  Laterality: Left;   ESOPHAGOGASTRODUODENOSCOPY Left 07/26/2023   Procedure: EGD (ESOPHAGOGASTRODUODENOSCOPY);  Surgeon: Kristie Lamprey, MD;  Location: [facility];  Service: Gastroenterology;  Laterality: Left;  Dysphagia-history of esophageal stricture   FOREIGN BODY REMOVAL  01/30/2019   Procedure: FOREIGN BODY REMOVAL;   Surgeon: San Sandor GAILS, DO;  Location: [facility];  Service: Gastroenterology;;   HIP ARTHROPLASTY Right 05/26/2022   Procedure: ARTHROPLASTY BIPOLAR HIP (HEMIARTHROPLASTY);  Surgeon: Fidel Rogue, MD;  Location: [facility];  Service: Orthopedics;  Laterality: Right;   IR CYSTOSTOMY TUBE CHANGE COMPLICATED W IMG  09/30/2023   MULTIPLE TOOTH EXTRACTIONS     TOTAL ABDOMINAL HYSTERECTOMY  1999   UPPER GASTROINTESTINAL ENDOSCOPY      Prior to Admission medications   Medication Sig Start Date End Date Taking? Authorizing Provider  fluticasone  (FLONASE ) 50 MCG/ACT nasal spray Place 2 sprays into the nose daily. 12/18/10 12/04/23 Yes [place], Peter M, MD  levothyroxine  (SYNTHROID , LEVOTHROID) 88 MCG tablet Take 88 mcg by mouth daily before breakfast. *BRAND NAME ONLY   Yes [provider]  acetaminophen  (TYLENOL ) 500 MG tablet Take 1,000 mg by mouth in the morning and at bedtime.    [provider]  atorvastatin  (LIPITOR) 10 MG tablet Take 10 mg by mouth daily. Patient not taking: Reported on 12/04/2023 05/29/23   [provider]  atorvastatin  (LIPITOR) 20 MG tablet Take 20 mg by mouth at bedtime. Patient not taking: Reported on 12/04/2023    [provider]  brimonidine (ALPHAGAN) 0.2 % ophthalmic solution Place 1 drop into both eyes 3 (three) times daily. 09/03/23   [provider]  dorzolamide -timolol  (COSOPT ) 2-0.5 % ophthalmic solution Place 1 drop into the right eye 2 (two) times daily.    [provider]  famotidine  (PEPCID ) 40 MG tablet Take 1 tablet (40 mg total) by mouth at bedtime. 07/26/23   Franklyn Sid SAILOR, MD  fexofenadine (ALLEGRA) 180 MG tablet Take 180 mg by mouth every morning. Patient not taking: Reported on 12/04/2023    [provider]  HYDROcodone -acetaminophen  (NORCO/VICODIN) 5-325 MG tablet Take 1 tablet by mouth every 4 (four) hours as needed for moderate pain or severe pain. Patient not taking: Reported on  12/04/2023 05/27/22   Leigh Valery RAMAN, PA-C  ipratropium (ATROVENT ) 0.03 % nasal spray Place 2 sprays into both nostrils daily. 03/01/22   [provider]  latanoprost  (XALATAN ) 0.005 % ophthalmic solution Place 1 drop into both eyes at bedtime.    [provider]  levocetirizine (XYZAL) 5 MG tablet Take 5 mg by mouth every evening.    [provider]  metoprolol  succinate (TOPROL -XL) 50 MG 24 hr tablet TAKE ONE TABLET BY MOUTH ONE TIME DAILY Patient taking differently: Take 50 mg by mouth daily. 09/02/12   [place], Peter M, MD  MICONAZOLE 3 200 MG vaginal suppository Place 200 mg vaginally at bedtime. Patient not taking: Reported on 12/04/2023 06/23/23   [provider]  montelukast  (SINGULAIR ) 10 MG tablet Take 10 mg by mouth at bedtime.    [provider]  Multiple Vitamins-Minerals (PRESERVISION AREDS 2 PO) Take 1 tablet by mouth 2 (two) times daily.     [provider]  ondansetron  (ZOFRAN ) 4 MG tablet Take 4 mg by mouth every 8 (eight) hours as needed for nausea or vomiting.    [provider]  pantoprazole  (PROTONIX ) 40 MG tablet TAKE 1 TABLET BY MOUTH 2 TIMES DAILY Patient taking differently: Take 40 mg by mouth 2 (two) times daily. 05/10/20   Sonny Anthes V, DO  sodium chloride  (MURO 128) 2 % ophthalmic solution Place 1 drop into both eyes in the morning, at noon, in the evening, and at bedtime.    [provider]  sodium chloride  (MURO 128) 5 % ophthalmic ointment Place 1 Application into both eyes at bedtime.    [provider]  sucralfate  (CARAFATE ) 1 GM/10ML suspension Take 10 mLs (1 g total) by mouth 4 (four) times daily. Patient not taking: Reported on 12/04/2023 09/16/23   Craig Alan SAUNDERS, PA-C  tamsulosin (FLOMAX) 0.4 MG CAPS capsule Take 0.8 mg by mouth daily. 06/08/23   [provider]  triamcinolone (NASACORT ALLERGY 24HR) 55 MCG/ACT AERO nasal inhaler Place 2 sprays into the nose  daily. Patient not taking: Reported on 12/04/2023    [provider]  triamcinolone cream (KENALOG) 0.5 % Apply 1 Application topically daily as needed (for irritation on hands). Patient not taking: Reported on 12/04/2023 01/18/19   [provider]    Current Facility-Administered Medications  Medication Dose Route Frequency Provider Last Rate Last Admin   0.9 %  sodium chloride  infusion   Intravenous Continuous Craig Alan SAUNDERS, PA-C        Allergies as of 09/16/2023   (No Known Allergies)    Family History  Problem Relation Age of Onset   Hypertension Mother    Heart attack Mother    Heart attack Father 10   Hypertension Father    Peripheral vascular disease Father    Heart failure Sister    Cancer Sister    Hypertension Brother    Cancer Brother    Hypertension Brother    Breast cancer Sister    Ovarian cancer Other  mat cousin   Prostate cancer Maternal Uncle    Colon polyps Sister        siblings   Colon cancer Other        Maternal Aunt x3 Maternal Uncle x 2   Esophageal cancer Brother        died at 65   Kidney disease Brother    Stomach cancer Neg Hx    Rectal cancer Neg Hx     Social History   Socioeconomic History   Marital status: Widowed    Spouse name: Not on file   Number of children: 1   Years of education: Not on file   Highest education level: Not on file  Occupational History   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, civil (consulting)  Tobacco Use   Smoking status: Never   Smokeless tobacco: Never  Vaping Use   Vaping status: Never Used  Substance and Sexual Activity   Alcohol  use: No    Alcohol /week: 0.0 standard drinks of alcohol     Comment: <1 day   Drug use: No   Sexual activity: Not on file  Other Topics Concern   Not on file  Social History Narrative   Not on file   Social Drivers of Health   Financial Resource Strain: Not on file  Food Insecurity: No Food Insecurity (05/25/2022)   Hunger  Vital Sign    Worried About Running Out of Food in the Last Year: Never true    Ran Out of Food in the Last Year: Never true  Transportation Needs: No Transportation Needs (05/25/2022)   PRAPARE - Administrator, Civil Service (Medical): No    Lack of Transportation (Non-Medical): No  Physical Activity: Not on file  Stress: Not on file  Social Connections: Not on file  Intimate Partner Violence: Not At Risk (05/25/2022)   Humiliation, Afraid, Rape, and Kick questionnaire    Fear of Current or Ex-Partner: No    Emotionally Abused: No    Physically Abused: No    Sexually Abused: No    Physical Exam: Vital signs in last 24 hours: @BP  (!) 150/60   Pulse 81   Temp (!) 97.1 F (36.2 C) (Temporal)   Resp (!) 21   Ht 5' 5 (1.651 m)   Wt 48.5 kg   SpO2 97%   BMI 17.81 kg/m  GEN: NAD EYE: Sclerae anicteric ENT: MMM CV: Non-tachycardic Pulm: CTA b/l GI: Soft, NT/ND NEURO:  Alert & Oriented x 3   Sandor Flatter, DO [facility]   12/04/2023 7:31 AM

## 2023-12-04 NOTE — Anesthesia Postprocedure Evaluation (Signed)
 Anesthesia Post Note  Patient: Megan Foster  Procedure(s) Performed: EGD, WITH DILATION USING SAVARY-GILLIARD DILATOR OVER GUIDEWIRE EGD (ESOPHAGOGASTRODUODENOSCOPY)     Patient location during evaluation: PACU Anesthesia Type: MAC Level of consciousness: awake and alert and oriented Pain management: pain level controlled Vital Signs Assessment: post-procedure vital signs reviewed and stable Respiratory status: spontaneous breathing, nonlabored ventilation and respiratory function stable Cardiovascular status: stable and blood pressure returned to baseline Postop Assessment: no apparent nausea or vomiting Anesthetic complications: no   No notable events documented.  Last Vitals:  Vitals:   12/04/23 0815 12/04/23 0830  BP: (!) 103/47 (!) 140/61  Pulse: 79 75  Resp: (!) 21 19  Temp:  36.6 C  SpO2: 97% 100%    Last Pain:  Vitals:   12/04/23 0830  TempSrc:   PainSc: 0-No pain                 Crews Mccollam A.

## 2023-12-04 NOTE — Interval H&P Note (Signed)
 History and Physical Interval Note:  12/04/2023 7:32 AM  Megan Foster  has presented today for surgery, with the diagnosis of Food impaction of esophagus, initial encounter [T18.128A, [id number].F3XA] Generalized abdominal pain [R10.84] Dysphagia, unspecified type [R13.10 Loss of weight [R63.4] Generalized abdominal pain [R10.84 Chronic mesenteric ischemia (HCC) [K55.1].  The various methods of treatment have been discussed with the patient and family. After consideration of risks, benefits and other options for treatment, the patient has consented to  Procedure(s): EGD, WITH DILATION USING SAVARY-GILLIARD DILATOR OVER GUIDEWIRE (N/A) as a surgical intervention.  The patient's history has been reviewed, patient examined, no change in status, stable for surgery.  I have reviewed the patient's chart and labs.  Questions were answered to the patient's satisfaction.     Sandor GAILS Megan Foster

## 2023-12-04 NOTE — Transfer of Care (Signed)
 Immediate Anesthesia Transfer of Care Note  Patient: Megan Foster  Procedure(s) Performed: EGD, WITH DILATION USING SAVARY-GILLIARD DILATOR OVER GUIDEWIRE EGD (ESOPHAGOGASTRODUODENOSCOPY)  Patient Location: PACU  Anesthesia Type:MAC  Level of Consciousness: drowsy and patient cooperative  Airway & Oxygen  Therapy: Patient Spontanous Breathing and Patient connected to nasal cannula oxygen   Post-op Assessment: Report given to RN and Post -op Vital signs reviewed and stable  Post vital signs: Reviewed and stable  Last Vitals:  Vitals Value Taken Time  BP 103/51 12/04/23 08:13  Temp 36.6 C 12/04/23 08:13  Pulse 80 12/04/23 08:13  Resp 22 12/04/23 08:13  SpO2 97 % 12/04/23 08:13  Vitals shown include unfiled device data.  Last Pain:  Vitals:   12/04/23 0716  TempSrc: Temporal  PainSc: 0-No pain         Complications: No notable events documented.

## 2023-12-05 DIAGNOSIS — I10 Essential (primary) hypertension: Secondary | ICD-10-CM | POA: Diagnosis not present

## 2023-12-05 DIAGNOSIS — D649 Anemia, unspecified: Secondary | ICD-10-CM | POA: Diagnosis not present

## 2023-12-05 DIAGNOSIS — M6281 Muscle weakness (generalized): Secondary | ICD-10-CM | POA: Diagnosis not present

## 2023-12-05 DIAGNOSIS — R2681 Unsteadiness on feet: Secondary | ICD-10-CM | POA: Diagnosis not present

## 2023-12-05 LAB — SURGICAL PATHOLOGY

## 2023-12-07 DIAGNOSIS — R2681 Unsteadiness on feet: Secondary | ICD-10-CM | POA: Diagnosis not present

## 2023-12-07 DIAGNOSIS — M6281 Muscle weakness (generalized): Secondary | ICD-10-CM | POA: Diagnosis not present

## 2023-12-08 ENCOUNTER — Encounter (HOSPITAL_COMMUNITY): Payer: Self-pay | Admitting: Gastroenterology

## 2023-12-08 DIAGNOSIS — M6281 Muscle weakness (generalized): Secondary | ICD-10-CM | POA: Diagnosis not present

## 2023-12-08 DIAGNOSIS — R2681 Unsteadiness on feet: Secondary | ICD-10-CM | POA: Diagnosis not present

## 2023-12-10 ENCOUNTER — Ambulatory Visit: Payer: Self-pay | Admitting: Gastroenterology

## 2023-12-10 DIAGNOSIS — M6281 Muscle weakness (generalized): Secondary | ICD-10-CM | POA: Diagnosis not present

## 2023-12-10 DIAGNOSIS — R2681 Unsteadiness on feet: Secondary | ICD-10-CM | POA: Diagnosis not present

## 2023-12-11 DIAGNOSIS — M6281 Muscle weakness (generalized): Secondary | ICD-10-CM | POA: Diagnosis not present

## 2023-12-11 DIAGNOSIS — R2681 Unsteadiness on feet: Secondary | ICD-10-CM | POA: Diagnosis not present

## 2023-12-12 DIAGNOSIS — E032 Hypothyroidism due to medicaments and other exogenous substances: Secondary | ICD-10-CM | POA: Diagnosis not present

## 2023-12-12 DIAGNOSIS — K5900 Constipation, unspecified: Secondary | ICD-10-CM | POA: Diagnosis not present

## 2023-12-12 DIAGNOSIS — E785 Hyperlipidemia, unspecified: Secondary | ICD-10-CM | POA: Diagnosis not present

## 2023-12-12 DIAGNOSIS — M6281 Muscle weakness (generalized): Secondary | ICD-10-CM | POA: Diagnosis not present

## 2023-12-12 DIAGNOSIS — I10 Essential (primary) hypertension: Secondary | ICD-10-CM | POA: Diagnosis not present

## 2023-12-12 DIAGNOSIS — R2681 Unsteadiness on feet: Secondary | ICD-10-CM | POA: Diagnosis not present

## 2023-12-15 DIAGNOSIS — M6281 Muscle weakness (generalized): Secondary | ICD-10-CM | POA: Diagnosis not present

## 2023-12-15 DIAGNOSIS — R2681 Unsteadiness on feet: Secondary | ICD-10-CM | POA: Diagnosis not present

## 2023-12-16 DIAGNOSIS — R2681 Unsteadiness on feet: Secondary | ICD-10-CM | POA: Diagnosis not present

## 2023-12-16 DIAGNOSIS — M6281 Muscle weakness (generalized): Secondary | ICD-10-CM | POA: Diagnosis not present

## 2023-12-17 DIAGNOSIS — R2681 Unsteadiness on feet: Secondary | ICD-10-CM | POA: Diagnosis not present

## 2023-12-17 DIAGNOSIS — M6281 Muscle weakness (generalized): Secondary | ICD-10-CM | POA: Diagnosis not present

## 2023-12-18 DIAGNOSIS — M6281 Muscle weakness (generalized): Secondary | ICD-10-CM | POA: Diagnosis not present

## 2023-12-18 DIAGNOSIS — R2681 Unsteadiness on feet: Secondary | ICD-10-CM | POA: Diagnosis not present

## 2023-12-19 DIAGNOSIS — R2681 Unsteadiness on feet: Secondary | ICD-10-CM | POA: Diagnosis not present

## 2023-12-19 DIAGNOSIS — M6281 Muscle weakness (generalized): Secondary | ICD-10-CM | POA: Diagnosis not present

## 2023-12-22 DIAGNOSIS — M6281 Muscle weakness (generalized): Secondary | ICD-10-CM | POA: Diagnosis not present

## 2023-12-22 DIAGNOSIS — R2681 Unsteadiness on feet: Secondary | ICD-10-CM | POA: Diagnosis not present

## 2023-12-23 ENCOUNTER — Telehealth: Payer: Self-pay

## 2023-12-23 DIAGNOSIS — M6281 Muscle weakness (generalized): Secondary | ICD-10-CM | POA: Diagnosis not present

## 2023-12-23 DIAGNOSIS — R2681 Unsteadiness on feet: Secondary | ICD-10-CM | POA: Diagnosis not present

## 2023-12-23 NOTE — Telephone Encounter (Signed)
-----   Message from [facility] sent at 12/10/2023  2:30 PM EDT ----- Regarding: January2026 Needs 3 month follow up with Dr JAYSON

## 2023-12-23 NOTE — Telephone Encounter (Signed)
 Left message for patient to return call to schedule 3 month follow up with Dr San or POD B app.  Will continue efforts.

## 2023-12-24 DIAGNOSIS — R2681 Unsteadiness on feet: Secondary | ICD-10-CM | POA: Diagnosis not present

## 2023-12-24 DIAGNOSIS — M6281 Muscle weakness (generalized): Secondary | ICD-10-CM | POA: Diagnosis not present

## 2023-12-25 DIAGNOSIS — R2681 Unsteadiness on feet: Secondary | ICD-10-CM | POA: Diagnosis not present

## 2023-12-25 DIAGNOSIS — M6281 Muscle weakness (generalized): Secondary | ICD-10-CM | POA: Diagnosis not present

## 2023-12-25 NOTE — Telephone Encounter (Signed)
 Neville is aware that patient is scheduled to see Dr San on 03-08-24 at 2pm.  Neville agreed to plan and verbalized understanding.

## 2023-12-26 DIAGNOSIS — R2681 Unsteadiness on feet: Secondary | ICD-10-CM | POA: Diagnosis not present

## 2023-12-26 DIAGNOSIS — M6281 Muscle weakness (generalized): Secondary | ICD-10-CM | POA: Diagnosis not present

## 2023-12-29 DIAGNOSIS — M6281 Muscle weakness (generalized): Secondary | ICD-10-CM | POA: Diagnosis not present

## 2023-12-29 DIAGNOSIS — R2681 Unsteadiness on feet: Secondary | ICD-10-CM | POA: Diagnosis not present

## 2023-12-30 DIAGNOSIS — R2681 Unsteadiness on feet: Secondary | ICD-10-CM | POA: Diagnosis not present

## 2023-12-30 DIAGNOSIS — M6281 Muscle weakness (generalized): Secondary | ICD-10-CM | POA: Diagnosis not present

## 2023-12-31 DIAGNOSIS — H18593 Other hereditary corneal dystrophies, bilateral: Secondary | ICD-10-CM | POA: Diagnosis not present

## 2023-12-31 DIAGNOSIS — H401411 Capsular glaucoma with pseudoexfoliation of lens, right eye, mild stage: Secondary | ICD-10-CM | POA: Diagnosis not present

## 2023-12-31 DIAGNOSIS — Z961 Presence of intraocular lens: Secondary | ICD-10-CM | POA: Diagnosis not present

## 2023-12-31 DIAGNOSIS — H40012 Open angle with borderline findings, low risk, left eye: Secondary | ICD-10-CM | POA: Diagnosis not present

## 2023-12-31 DIAGNOSIS — H1045 Other chronic allergic conjunctivitis: Secondary | ICD-10-CM | POA: Diagnosis not present

## 2023-12-31 DIAGNOSIS — M6281 Muscle weakness (generalized): Secondary | ICD-10-CM | POA: Diagnosis not present

## 2023-12-31 DIAGNOSIS — R2681 Unsteadiness on feet: Secondary | ICD-10-CM | POA: Diagnosis not present

## 2024-01-01 DIAGNOSIS — M6281 Muscle weakness (generalized): Secondary | ICD-10-CM | POA: Diagnosis not present

## 2024-01-01 DIAGNOSIS — R2681 Unsteadiness on feet: Secondary | ICD-10-CM | POA: Diagnosis not present

## 2024-01-02 DIAGNOSIS — M6281 Muscle weakness (generalized): Secondary | ICD-10-CM | POA: Diagnosis not present

## 2024-01-02 DIAGNOSIS — R2681 Unsteadiness on feet: Secondary | ICD-10-CM | POA: Diagnosis not present

## 2024-01-03 DIAGNOSIS — R1024 Suprapubic pain: Secondary | ICD-10-CM | POA: Diagnosis not present

## 2024-01-03 DIAGNOSIS — R8290 Unspecified abnormal findings in urine: Secondary | ICD-10-CM | POA: Diagnosis not present

## 2024-01-04 DIAGNOSIS — R2681 Unsteadiness on feet: Secondary | ICD-10-CM | POA: Diagnosis not present

## 2024-01-04 DIAGNOSIS — M6281 Muscle weakness (generalized): Secondary | ICD-10-CM | POA: Diagnosis not present

## 2024-01-05 DIAGNOSIS — M6281 Muscle weakness (generalized): Secondary | ICD-10-CM | POA: Diagnosis not present

## 2024-01-05 DIAGNOSIS — R2681 Unsteadiness on feet: Secondary | ICD-10-CM | POA: Diagnosis not present

## 2024-01-06 DIAGNOSIS — I10 Essential (primary) hypertension: Secondary | ICD-10-CM | POA: Diagnosis not present

## 2024-01-06 DIAGNOSIS — N319 Neuromuscular dysfunction of bladder, unspecified: Secondary | ICD-10-CM | POA: Diagnosis not present

## 2024-01-06 DIAGNOSIS — K219 Gastro-esophageal reflux disease without esophagitis: Secondary | ICD-10-CM | POA: Diagnosis not present

## 2024-01-06 DIAGNOSIS — R2681 Unsteadiness on feet: Secondary | ICD-10-CM | POA: Diagnosis not present

## 2024-01-06 DIAGNOSIS — M6281 Muscle weakness (generalized): Secondary | ICD-10-CM | POA: Diagnosis not present

## 2024-01-06 DIAGNOSIS — E785 Hyperlipidemia, unspecified: Secondary | ICD-10-CM | POA: Diagnosis not present

## 2024-01-06 DIAGNOSIS — D649 Anemia, unspecified: Secondary | ICD-10-CM | POA: Diagnosis not present

## 2024-01-06 DIAGNOSIS — N390 Urinary tract infection, site not specified: Secondary | ICD-10-CM | POA: Diagnosis not present

## 2024-01-09 DIAGNOSIS — M6281 Muscle weakness (generalized): Secondary | ICD-10-CM | POA: Diagnosis not present

## 2024-01-09 DIAGNOSIS — R2681 Unsteadiness on feet: Secondary | ICD-10-CM | POA: Diagnosis not present

## 2024-01-10 DIAGNOSIS — M6281 Muscle weakness (generalized): Secondary | ICD-10-CM | POA: Diagnosis not present

## 2024-01-10 DIAGNOSIS — R2681 Unsteadiness on feet: Secondary | ICD-10-CM | POA: Diagnosis not present

## 2024-01-11 DIAGNOSIS — R2681 Unsteadiness on feet: Secondary | ICD-10-CM | POA: Diagnosis not present

## 2024-01-11 DIAGNOSIS — M6281 Muscle weakness (generalized): Secondary | ICD-10-CM | POA: Diagnosis not present

## 2024-01-19 DIAGNOSIS — E032 Hypothyroidism due to medicaments and other exogenous substances: Secondary | ICD-10-CM | POA: Diagnosis not present

## 2024-01-19 DIAGNOSIS — E785 Hyperlipidemia, unspecified: Secondary | ICD-10-CM | POA: Diagnosis not present

## 2024-01-19 DIAGNOSIS — N319 Neuromuscular dysfunction of bladder, unspecified: Secondary | ICD-10-CM | POA: Diagnosis not present

## 2024-01-19 DIAGNOSIS — I10 Essential (primary) hypertension: Secondary | ICD-10-CM | POA: Diagnosis not present

## 2024-03-08 ENCOUNTER — Ambulatory Visit: Admitting: Gastroenterology

## 2024-03-23 ENCOUNTER — Ambulatory Visit: Admitting: Gastroenterology
